# Patient Record
Sex: Male | Born: 1950 | Race: White | Hispanic: No | Marital: Married | Smoking: Former smoker
Health system: Southern US, Community
[De-identification: ages and names within clinical notes are randomized; demographics above are authoritative.]

## PROBLEM LIST (undated history)

## (undated) DIAGNOSIS — Z953 Presence of xenogenic heart valve: Secondary | ICD-10-CM

## (undated) DIAGNOSIS — I351 Nonrheumatic aortic (valve) insufficiency: Secondary | ICD-10-CM

## (undated) DIAGNOSIS — E039 Hypothyroidism, unspecified: Secondary | ICD-10-CM

## (undated) DIAGNOSIS — I25119 Atherosclerotic heart disease of native coronary artery with unspecified angina pectoris: Secondary | ICD-10-CM

## (undated) DIAGNOSIS — Z951 Presence of aortocoronary bypass graft: Secondary | ICD-10-CM

## (undated) DIAGNOSIS — E785 Hyperlipidemia, unspecified: Secondary | ICD-10-CM

## (undated) DIAGNOSIS — J449 Chronic obstructive pulmonary disease, unspecified: Secondary | ICD-10-CM

## (undated) DIAGNOSIS — I9789 Other postprocedural complications and disorders of the circulatory system, not elsewhere classified: Secondary | ICD-10-CM

## (undated) DIAGNOSIS — I4891 Unspecified atrial fibrillation: Secondary | ICD-10-CM

## (undated) DIAGNOSIS — Z720 Tobacco use: Secondary | ICD-10-CM

## (undated) DIAGNOSIS — I5022 Chronic systolic (congestive) heart failure: Secondary | ICD-10-CM

## (undated) DIAGNOSIS — I10 Essential (primary) hypertension: Secondary | ICD-10-CM

## (undated) DIAGNOSIS — I5042 Chronic combined systolic (congestive) and diastolic (congestive) heart failure: Secondary | ICD-10-CM

## (undated) DIAGNOSIS — N183 Chronic kidney disease, stage 3 unspecified: Secondary | ICD-10-CM

## (undated) DIAGNOSIS — I739 Peripheral vascular disease, unspecified: Secondary | ICD-10-CM

## (undated) HISTORY — DX: Hyperlipidemia, unspecified: E78.5

## (undated) HISTORY — DX: Chronic systolic (congestive) heart failure: I50.22

## (undated) HISTORY — DX: Essential (primary) hypertension: I10

## (undated) HISTORY — DX: Chronic combined systolic (congestive) and diastolic (congestive) heart failure: I50.42

## (undated) HISTORY — DX: Unspecified atrial fibrillation: I97.89

## (undated) HISTORY — DX: Unspecified atrial fibrillation: I48.91

## (undated) HISTORY — DX: Peripheral vascular disease, unspecified: I73.9

## (undated) HISTORY — DX: Nonrheumatic aortic (valve) insufficiency: I35.1

---
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
## 2019-05-14 DIAGNOSIS — R0989 Other specified symptoms and signs involving the circulatory and respiratory systems: Secondary | ICD-10-CM

## 2019-05-14 DIAGNOSIS — I351 Nonrheumatic aortic (valve) insufficiency: Secondary | ICD-10-CM

## 2019-05-14 DIAGNOSIS — R079 Chest pain, unspecified: Secondary | ICD-10-CM

## 2019-05-14 DIAGNOSIS — I361 Nonrheumatic tricuspid (valve) insufficiency: Secondary | ICD-10-CM | POA: Diagnosis not present

## 2019-05-14 DIAGNOSIS — R7989 Other specified abnormal findings of blood chemistry: Secondary | ICD-10-CM | POA: Diagnosis not present

## 2019-05-14 DIAGNOSIS — E039 Hypothyroidism, unspecified: Secondary | ICD-10-CM

## 2019-05-14 DIAGNOSIS — J449 Chronic obstructive pulmonary disease, unspecified: Secondary | ICD-10-CM

## 2019-05-14 DIAGNOSIS — I340 Nonrheumatic mitral (valve) insufficiency: Secondary | ICD-10-CM

## 2019-05-14 NOTE — Progress Notes (Deleted)
Cardiology Office Note:    Date:  05/14/2019   ID:  Dennis Gomez, DOB [birth year], MRN [id number]  PCP:  Serita Grammes, MD  Cardiologist:  Shirlee More, MD   Referring MD: No ref. provider found  ASSESSMENT:    No diagnosis found. PLAN:    In order of problems listed above:  1. ***  Next appointment   Medication Adjustments/Labs and Tests Ordered: Current medicines are reviewed at length with the patient today.  Concerns regarding medicines are outlined above.  No orders of the defined types were placed in this encounter.  No orders of the defined types were placed in this encounter.    No chief complaint on file. ***  History of Present Illness:    Dennis Gomez is a 68 y.o. male who is being seen today for the evaluation of heart failure at the request of No ref. provider found.   No past medical history on file.  *** The histories are not reviewed yet. Please review them in the "History" navigator section and refresh this [place].  Current Medications: No outpatient medications have been marked as taking for the 05/15/19 encounter (Appointment) with Richardo Priest, MD.     Allergies:   Patient has no allergy information on record.   Social History   Socioeconomic History  . Marital status: Married    Spouse name: Not on file  . Number of children: Not on file  . Years of education: Not on file  . Highest education level: Not on file  Occupational History  . Not on file  Social Needs  . Financial resource strain: Not on file  . Food insecurity    Worry: Not on file    Inability: Not on file  . Transportation needs    Medical: Not on file    Non-medical: Not on file  Tobacco Use  . Smoking status: Not on file  Substance and Sexual Activity  . Alcohol use: Not on file  . Drug use: Not on file  . Sexual activity: Not on file  Lifestyle  . Physical activity    Days per week: Not on file    Minutes per session: Not on file  . Stress:  Not on file  Relationships  . Social Herbalist on phone: Not on file    Gets together: Not on file    Attends religious service: Not on file    Active member of club or organization: Not on file    Attends meetings of clubs or organizations: Not on file    Relationship status: Not on file  Other Topics Concern  . Not on file  Social History Narrative  . Not on file     Family History: The patient's ***family history is not on file.  ROS:   ROS Please see the history of present illness.    *** All other systems reviewed and are negative.  EKGs/Labs/Other Studies Reviewed:    The following studies were reviewed today: ***  EKG:  EKG is *** ordered today.  The ekg ordered today is personally reviewed and demonstrates ***  Recent Labs: No results found for requested labs within last 8760 hours.  Recent Lipid Panel No results found for: CHOL, TRIG, HDL, CHOLHDL, VLDL, LDLCALC, LDLDIRECT  Physical Exam:    VS:  There were no vitals taken for this visit.    Wt Readings from Last 3 Encounters:  No data found for Wt     GEN: ***  Well nourished, well developed in no acute distress HEENT: Normal NECK: No JVD; No carotid bruits LYMPHATICS: No lymphadenopathy CARDIAC: ***RRR, no murmurs, rubs, gallops RESPIRATORY:  Clear to auscultation without rales, wheezing or rhonchi  ABDOMEN: Soft, non-tender, non-distended MUSCULOSKELETAL:  No edema; No deformity  SKIN: Warm and dry NEUROLOGIC:  Alert and oriented x 3 PSYCHIATRIC:  Normal affect     Signed, Norman Herrlich, MD  05/14/2019 11:04 AM    [facility]

## 2019-05-15 ENCOUNTER — Ambulatory Visit: Payer: Self-pay | Admitting: Cardiology

## 2019-05-15 DIAGNOSIS — J449 Chronic obstructive pulmonary disease, unspecified: Secondary | ICD-10-CM | POA: Diagnosis not present

## 2019-05-15 DIAGNOSIS — R7989 Other specified abnormal findings of blood chemistry: Secondary | ICD-10-CM | POA: Diagnosis not present

## 2019-05-15 DIAGNOSIS — I5040 Unspecified combined systolic (congestive) and diastolic (congestive) heart failure: Secondary | ICD-10-CM

## 2019-05-15 DIAGNOSIS — R0989 Other specified symptoms and signs involving the circulatory and respiratory systems: Secondary | ICD-10-CM | POA: Diagnosis not present

## 2019-05-15 DIAGNOSIS — E039 Hypothyroidism, unspecified: Secondary | ICD-10-CM | POA: Diagnosis not present

## 2019-05-15 DIAGNOSIS — R079 Chest pain, unspecified: Secondary | ICD-10-CM

## 2019-05-16 ENCOUNTER — Inpatient Hospital Stay (HOSPITAL_COMMUNITY)
Admission: AD | Admit: 2019-05-16 | Discharge: 2019-05-27 | DRG: 216 | Disposition: A | Discharge status: Home Health | Payer: Medicare Other | Source: Other Acute Inpatient Hospital | Attending: Thoracic Surgery (Cardiothoracic Vascular Surgery) | Admitting: Thoracic Surgery (Cardiothoracic Vascular Surgery)

## 2019-05-16 ENCOUNTER — Encounter (HOSPITAL_COMMUNITY): Payer: Self-pay | Admitting: Internal Medicine

## 2019-05-16 ENCOUNTER — Other Ambulatory Visit: Payer: Self-pay

## 2019-05-16 DIAGNOSIS — I25119 Atherosclerotic heart disease of native coronary artery with unspecified angina pectoris: Secondary | ICD-10-CM | POA: Diagnosis present

## 2019-05-16 DIAGNOSIS — Q231 Congenital insufficiency of aortic valve: Secondary | ICD-10-CM | POA: Diagnosis not present

## 2019-05-16 DIAGNOSIS — E785 Hyperlipidemia, unspecified: Secondary | ICD-10-CM | POA: Diagnosis not present

## 2019-05-16 DIAGNOSIS — I255 Ischemic cardiomyopathy: Secondary | ICD-10-CM | POA: Diagnosis not present

## 2019-05-16 DIAGNOSIS — I351 Nonrheumatic aortic (valve) insufficiency: Secondary | ICD-10-CM | POA: Diagnosis present

## 2019-05-16 DIAGNOSIS — Z720 Tobacco use: Secondary | ICD-10-CM | POA: Diagnosis present

## 2019-05-16 DIAGNOSIS — I447 Left bundle-branch block, unspecified: Secondary | ICD-10-CM | POA: Diagnosis not present

## 2019-05-16 DIAGNOSIS — J449 Chronic obstructive pulmonary disease, unspecified: Secondary | ICD-10-CM | POA: Diagnosis present

## 2019-05-16 DIAGNOSIS — I340 Nonrheumatic mitral (valve) insufficiency: Secondary | ICD-10-CM | POA: Diagnosis not present

## 2019-05-16 DIAGNOSIS — I440 Atrioventricular block, first degree: Secondary | ICD-10-CM | POA: Diagnosis not present

## 2019-05-16 DIAGNOSIS — I77810 Thoracic aortic ectasia: Secondary | ICD-10-CM | POA: Diagnosis present

## 2019-05-16 DIAGNOSIS — E876 Hypokalemia: Secondary | ICD-10-CM | POA: Diagnosis not present

## 2019-05-16 DIAGNOSIS — Z20828 Contact with and (suspected) exposure to other viral communicable diseases: Secondary | ICD-10-CM | POA: Diagnosis not present

## 2019-05-16 DIAGNOSIS — Z953 Presence of xenogenic heart valve: Secondary | ICD-10-CM

## 2019-05-16 DIAGNOSIS — Z79890 Hormone replacement therapy (postmenopausal): Secondary | ICD-10-CM | POA: Diagnosis not present

## 2019-05-16 DIAGNOSIS — E039 Hypothyroidism, unspecified: Secondary | ICD-10-CM

## 2019-05-16 DIAGNOSIS — J9811 Atelectasis: Secondary | ICD-10-CM

## 2019-05-16 DIAGNOSIS — I480 Paroxysmal atrial fibrillation: Secondary | ICD-10-CM | POA: Diagnosis not present

## 2019-05-16 DIAGNOSIS — R7989 Other specified abnormal findings of blood chemistry: Secondary | ICD-10-CM | POA: Diagnosis not present

## 2019-05-16 DIAGNOSIS — I5043 Acute on chronic combined systolic (congestive) and diastolic (congestive) heart failure: Secondary | ICD-10-CM | POA: Diagnosis not present

## 2019-05-16 DIAGNOSIS — N179 Acute kidney failure, unspecified: Secondary | ICD-10-CM | POA: Diagnosis present

## 2019-05-16 DIAGNOSIS — I130 Hypertensive heart and chronic kidney disease with heart failure and stage 1 through stage 4 chronic kidney disease, or unspecified chronic kidney disease: Secondary | ICD-10-CM | POA: Diagnosis not present

## 2019-05-16 DIAGNOSIS — N183 Chronic kidney disease, stage 3 unspecified: Secondary | ICD-10-CM | POA: Diagnosis present

## 2019-05-16 DIAGNOSIS — D62 Acute posthemorrhagic anemia: Secondary | ICD-10-CM | POA: Diagnosis not present

## 2019-05-16 DIAGNOSIS — I5022 Chronic systolic (congestive) heart failure: Secondary | ICD-10-CM | POA: Diagnosis present

## 2019-05-16 DIAGNOSIS — I429 Cardiomyopathy, unspecified: Secondary | ICD-10-CM

## 2019-05-16 DIAGNOSIS — Z716 Tobacco abuse counseling: Secondary | ICD-10-CM

## 2019-05-16 DIAGNOSIS — Z825 Family history of asthma and other chronic lower respiratory diseases: Secondary | ICD-10-CM | POA: Diagnosis not present

## 2019-05-16 DIAGNOSIS — F17210 Nicotine dependence, cigarettes, uncomplicated: Secondary | ICD-10-CM | POA: Diagnosis not present

## 2019-05-16 DIAGNOSIS — R079 Chest pain, unspecified: Secondary | ICD-10-CM

## 2019-05-16 DIAGNOSIS — I5040 Unspecified combined systolic (congestive) and diastolic (congestive) heart failure: Secondary | ICD-10-CM

## 2019-05-16 DIAGNOSIS — I25110 Atherosclerotic heart disease of native coronary artery with unstable angina pectoris: Secondary | ICD-10-CM | POA: Diagnosis present

## 2019-05-16 DIAGNOSIS — D649 Anemia, unspecified: Secondary | ICD-10-CM | POA: Diagnosis not present

## 2019-05-16 DIAGNOSIS — I509 Heart failure, unspecified: Secondary | ICD-10-CM

## 2019-05-16 DIAGNOSIS — Z951 Presence of aortocoronary bypass graft: Secondary | ICD-10-CM

## 2019-05-16 DIAGNOSIS — D6959 Other secondary thrombocytopenia: Secondary | ICD-10-CM | POA: Diagnosis not present

## 2019-05-16 DIAGNOSIS — I350 Nonrheumatic aortic (valve) stenosis: Secondary | ICD-10-CM

## 2019-05-16 DIAGNOSIS — Z79899 Other long term (current) drug therapy: Secondary | ICD-10-CM

## 2019-05-16 DIAGNOSIS — I10 Essential (primary) hypertension: Secondary | ICD-10-CM | POA: Diagnosis present

## 2019-05-16 DIAGNOSIS — R0989 Other specified symptoms and signs involving the circulatory and respiratory systems: Secondary | ICD-10-CM | POA: Diagnosis not present

## 2019-05-16 DIAGNOSIS — I517 Cardiomegaly: Secondary | ICD-10-CM

## 2019-05-16 HISTORY — DX: Chronic kidney disease, stage 3 unspecified: N18.30

## 2019-05-16 HISTORY — DX: Atherosclerotic heart disease of native coronary artery with unspecified angina pectoris: I25.119

## 2019-05-16 HISTORY — DX: Tobacco use: Z72.0

## 2019-05-16 HISTORY — DX: Hypothyroidism, unspecified: E03.9

## 2019-05-16 HISTORY — DX: Chronic obstructive pulmonary disease, unspecified: J44.9

## 2019-05-16 HISTORY — DX: Chest pain, unspecified: R07.9

## 2019-05-16 HISTORY — DX: Nonrheumatic aortic (valve) insufficiency: I35.1

## 2019-05-16 MED ORDER — ATORVASTATIN CALCIUM 40 MG PO TABS
40.0000 mg | ORAL_TABLET | Freq: Every day | ORAL | Status: DC
  Administered 2019-05-17 – 2019-05-20 (×4): 40 mg via ORAL
  Filled 2019-05-16 (×4): qty 1

## 2019-05-16 MED ORDER — UMECLIDINIUM-VILANTEROL 62.5-25 MCG/INH IN AEPB
1.0000 | INHALATION_SPRAY | Freq: Every day | RESPIRATORY_TRACT | Status: DC
  Administered 2019-05-17 – 2019-05-27 (×9): 1 via RESPIRATORY_TRACT
  Filled 2019-05-16 (×2): qty 14

## 2019-05-16 MED ORDER — FUROSEMIDE 40 MG PO TABS
40.0000 mg | ORAL_TABLET | Freq: Every day | ORAL | Status: DC
  Administered 2019-05-17 – 2019-05-20 (×4): 40 mg via ORAL
  Filled 2019-05-16 (×5): qty 1

## 2019-05-16 MED ORDER — ACETAMINOPHEN 650 MG RE SUPP: 650.0000 mg | Freq: Four times a day (QID) | RECTAL | Status: DC | PRN

## 2019-05-16 MED ORDER — SODIUM CHLORIDE 0.9 % IV SOLN: 250.0000 mL | INTRAVENOUS | Status: DC | PRN

## 2019-05-16 MED ORDER — ACETAMINOPHEN 325 MG PO TABS
650.0000 mg | ORAL_TABLET | Freq: Four times a day (QID) | ORAL | Status: DC | PRN
  Administered 2019-05-18: 650 mg via ORAL
  Filled 2019-05-16: qty 2

## 2019-05-16 MED ORDER — NICOTINE 21 MG/24HR TD PT24: 21.0000 mg | MEDICATED_PATCH | Freq: Every day | TRANSDERMAL | Status: DC | PRN

## 2019-05-16 MED ORDER — SODIUM CHLORIDE 0.9% FLUSH
3.0000 mL | Freq: Two times a day (BID) | INTRAVENOUS | Status: DC
  Administered 2019-05-16 – 2019-05-20 (×4): 3 mL via INTRAVENOUS

## 2019-05-16 MED ORDER — ASPIRIN EC 81 MG PO TBEC
81.0000 mg | DELAYED_RELEASE_TABLET | Freq: Every day | ORAL | Status: DC
  Administered 2019-05-18 – 2019-05-20 (×3): 81 mg via ORAL
  Filled 2019-05-16 (×4): qty 1

## 2019-05-16 MED ORDER — ALBUTEROL SULFATE (2.5 MG/3ML) 0.083% IN NEBU: 2.5000 mg | INHALATION_SOLUTION | Freq: Four times a day (QID) | RESPIRATORY_TRACT | Status: DC | PRN

## 2019-05-16 MED ORDER — LISINOPRIL 10 MG PO TABS
10.0000 mg | ORAL_TABLET | Freq: Every day | ORAL | Status: DC
  Administered 2019-05-17 – 2019-05-20 (×4): 10 mg via ORAL
  Filled 2019-05-16 (×5): qty 1

## 2019-05-16 MED ORDER — ENOXAPARIN SODIUM 40 MG/0.4ML ~~LOC~~ SOLN
40.0000 mg | SUBCUTANEOUS | Status: DC
  Administered 2019-05-16 – 2019-05-19 (×4): 40 mg via SUBCUTANEOUS
  Filled 2019-05-16 (×4): qty 0.4

## 2019-05-16 MED ORDER — LEVOTHYROXINE SODIUM 75 MCG PO TABS
150.0000 ug | ORAL_TABLET | Freq: Every day | ORAL | Status: DC
  Administered 2019-05-17 – 2019-05-21 (×5): 150 ug via ORAL
  Filled 2019-05-16 (×5): qty 2

## 2019-05-16 MED ORDER — PANTOPRAZOLE SODIUM 40 MG PO TBEC
40.0000 mg | DELAYED_RELEASE_TABLET | Freq: Every day | ORAL | Status: DC
  Administered 2019-05-17 – 2019-05-20 (×4): 40 mg via ORAL
  Filled 2019-05-16 (×4): qty 1

## 2019-05-16 MED ORDER — SODIUM CHLORIDE 0.9% FLUSH: 3.0000 mL | INTRAVENOUS | Status: DC | PRN

## 2019-05-16 MED ORDER — HYDRALAZINE HCL 10 MG PO TABS
10.0000 mg | ORAL_TABLET | Freq: Three times a day (TID) | ORAL | Status: DC
  Administered 2019-05-16 – 2019-05-20 (×13): 10 mg via ORAL
  Filled 2019-05-16 (×13): qty 1

## 2019-05-16 NOTE — H&P (Addendum)
TRH H&P    Patient Demographics:    Dennis Gomez, is a 68 y.o. male  MRN: [id number]  DOB - 1951-04-20  Admit Date - 05/16/2019  Referring MD/NP/PA:  ???  Outpatient Primary MD for the patient is [name], MD  Patient coming from: [facility]  Chief complaint-  Mod- severe AR   HPI:    Dennis Gomez  is a 68 y.o. male,   w hypothyroidism, Copd, CHF (EF 35-40%), apparently admitted for chest pain on 05/14/2019 at [facility].  Chest pain w/up  included echocardiogram that revealed new systolic with combined diastolic congestive heart failure with an EF of 35-40% with moderate to severe aortic regurgitation seen.  Cardiology consulted and recommended transfer to [facility] for cardiac catheterization and possible surgical consult for  aortic  regurgitation.  cardiology here Dr. Dulce Sellar spoke with Dr. Excell Seltzer 7/29.  Cardiology will need to be consulted upon arrival.  Note pertinent labs 05/14/2019 Trop 0.24 BNP 17,900  TSH 79   Cardiac echo 05/15/2019 Impression LV severely dilated Moderate concentric left ventricular hypertrophy Overall EF 35-40% Restrictive LV filling pattern consistent with elevated LA pressure Aortic valve bicuspid Moderate to severe aortic regurgitation Mild to moderate mitral regurgitation   Labs 05/16/2019 Na 138, K 4.1, Bun 31, Creatinine 1.30 Glucose 84 Wbc 6.8, hgb 10.6, Plt 194 BNP 17, 500    Review of systems:    In addition to the HPI above,  No Fever-chills, No Headache, No changes with Vision or hearing, No problems swallowing food or Liquids, No Chest pain currently and No Cough or Shortness of Breath, No Abdominal pain, No Nausea or Vomiting, bowel movements are regular, No Blood in stool or Urine, No dysuria, No new skin rashes or bruises, No new joints pains-aches,  No new weakness, tingling, numbness in any extremity, No recent  weight gain or loss, No polyuria, polydypsia or polyphagia, No significant Mental Stressors.  All other systems reviewed and are negative.    Past History of the following :    Past Medical History:  Diagnosis Date  . Aortic regurgitation   . COPD (chronic obstructive pulmonary disease) (HCC)   . Hypothyroid   . Mitral regurgitation       History reviewed. No pertinent surgical history. None   Social History:      Social History   Tobacco Use  . Smoking status: Current Every Day Smoker    Types: Cigarettes  . Smokeless tobacco: Never Used  Substance Use Topics  . Alcohol use: Not Currently       Family History :     Family History  Problem Relation Age of Onset  . Cancer Mother   . COPD Father       Home Medications:   Prior to Admission medications   Not on File     Allergies:    No Known Allergies   Physical Exam:   Vitals  Blood pressure (!) 153/45, pulse 63, temperature 99.3 F (37.4 C), temperature source Oral, resp. rate 18, height 5\' 5"  (1.651  m), weight 59.8 kg, SpO2 93 %.  1.  General: axox3  2. Psychiatric: euthymic  3. Neurologic: cn2-12 intact, reflexes 2+ symmetric, diffuse with no clonus, motor 5/5 in all 4 ext  4. HEENMT:  Anicteric, pupils 1.67mm symmetric, direct, consensual, near intact Neck: no jvd  5. Respiratory : CTAB, prolonged exp phase  6. Cardiovascular : rrr s1, s2, 2/6 sem rusb/ apex  7. Gastrointestinal:  Abd: soft, nt, nd, +bs  8. Skin:  Ext: no c/c/e,  Skin dry over elbows  9.Musculoskeletal:  Good ROM,  No adenopathy   Labs:     Data Review:    CBC No results for input(s): WBC, HGB, HCT, PLT, MCV, MCH, MCHC, RDW, LYMPHSABS, MONOABS, EOSABS, BASOSABS, BANDABS in the last 168 hours.  Invalid input(s): NEUTRABS, BANDSABD ------------------------------------------------------------------------------------------------------------------  No results found for this or any previous visit (from  the past 44 hour(s)).  Chemistries  No results for input(s): NA, K, CL, CO2, GLUCOSE, BUN, CREATININE, CALCIUM, MG, AST, ALT, ALKPHOS, BILITOT in the last 168 hours.  Invalid input(s): GFRCGP ------------------------------------------------------------------------------------------------------------------  ------------------------------------------------------------------------------------------------------------------ GFR: CrCl cannot be calculated (No successful lab value found.). Liver Function Tests: No results for input(s): AST, ALT, ALKPHOS, BILITOT, PROT, ALBUMIN in the last 168 hours. No results for input(s): LIPASE, AMYLASE in the last 168 hours. No results for input(s): AMMONIA in the last 168 hours. Coagulation Profile: No results for input(s): INR, PROTIME in the last 168 hours. Cardiac Enzymes: No results for input(s): CKTOTAL, CKMB, CKMBINDEX, TROPONINI in the last 168 hours. BNP (last 3 results) No results for input(s): PROBNP in the last 8760 hours. HbA1C: No results for input(s): HGBA1C in the last 72 hours. CBG: No results for input(s): GLUCAP in the last 168 hours. Lipid Profile: No results for input(s): CHOL, HDL, LDLCALC, TRIG, CHOLHDL, LDLDIRECT in the last 72 hours. Thyroid Function Tests: No results for input(s): TSH, T4TOTAL, FREET4, T3FREE, THYROIDAB in the last 72 hours. Anemia Panel: No results for input(s): VITAMINB12, FOLATE, FERRITIN, TIBC, IRON, RETICCTPCT in the last 72 hours.  --------------------------------------------------------------------------------------------------------------- Urine analysis: No results found for: COLORURINE, APPEARANCEUR, LABSPEC, PHURINE, GLUCOSEU, HGBUR, BILIRUBINUR, KETONESUR, PROTEINUR, UROBILINOGEN, NITRITE, LEUKOCYTESUR    Imaging Results:    No results found.     Assessment & Plan:    Principal Problem:   Aortic regurgitation Active Problems:   Hypothyroidism   Mitral valve regurgitation   COPD  (chronic obstructive pulmonary disease) (HCC)   Chest pain Not currently having Pt states occurs when he eats chicken, hot dogs, or ham burgers  PPI Consider GI consultation in AM  Aortic regurgitation Mitral regurgitation Cardiology consulted, appreciate input  Cardiomyopathy Unclear if ischemic or nonischemic Cardiology consulted , appreciate input Pt was on lasix 40mg  iv qday, will convert to 40mg  po qday Cont lisinopril 10mg  po qday Cont hydralazine 10mg  po tid since both were initiated at [facility], may be able to DC hydralazine and titrate up lisinopril, defer to cardiology Per discharge summary not on B blocker due to Copd ?  Hypothyroidism Cont Levothyroxine at 150 micrograms po qday (was increased at [place] per discharge summary, however on 05/15/2019, active medication states was on levothyroxine 100 micrograms po qday, may need to call [facility] to figure out correct dose)  Hyperlipidemia (LDL =174) Cont Lipitor 40mg  po qhs  Copd DC pulmicort, duoneb prn Start Anoro 1puff qday Albuterol neb q6h prn   Tobacco abuse Pt counselled on smoking cessation x 13minutes Nicotine patch prn    DVT Prophylaxis-   Lovenox -  SCDs   AM Labs Ordered, also please review Full Orders  Family Communication: Admission, patients condition and plan of care including tests being ordered have been discussed with the patient who indicate understanding and agree with the plan and Code Status.  Code Status:  FULL CODE<  ,  Notified Corrie DandyMary his spouse [facility]   Admission status: Observation: Based on patients clinical presentation and evaluation of above clinical data, I have made determination that patient meets output criteria at this time.  Time spent in minutes : 70   Pearson GrippeJames Lashayla Armes M.D on 05/16/2019 at 9:46 PM

## 2019-05-17 ENCOUNTER — Encounter (HOSPITAL_COMMUNITY)
Admission: AD | Disposition: A | Discharge status: Home Health | Payer: Self-pay | Source: Other Acute Inpatient Hospital | Attending: Thoracic Surgery (Cardiothoracic Vascular Surgery)

## 2019-05-17 ENCOUNTER — Ambulatory Visit (HOSPITAL_COMMUNITY): Payer: Medicare Other

## 2019-05-17 DIAGNOSIS — I25119 Atherosclerotic heart disease of native coronary artery with unspecified angina pectoris: Secondary | ICD-10-CM | POA: Diagnosis not present

## 2019-05-17 DIAGNOSIS — D649 Anemia, unspecified: Secondary | ICD-10-CM | POA: Diagnosis present

## 2019-05-17 DIAGNOSIS — I340 Nonrheumatic mitral (valve) insufficiency: Secondary | ICD-10-CM | POA: Diagnosis present

## 2019-05-17 DIAGNOSIS — E785 Hyperlipidemia, unspecified: Secondary | ICD-10-CM | POA: Diagnosis present

## 2019-05-17 DIAGNOSIS — I350 Nonrheumatic aortic (valve) stenosis: Secondary | ICD-10-CM | POA: Diagnosis not present

## 2019-05-17 DIAGNOSIS — I200 Unstable angina: Secondary | ICD-10-CM | POA: Diagnosis not present

## 2019-05-17 DIAGNOSIS — Z716 Tobacco abuse counseling: Secondary | ICD-10-CM | POA: Diagnosis not present

## 2019-05-17 DIAGNOSIS — N179 Acute kidney failure, unspecified: Secondary | ICD-10-CM | POA: Diagnosis not present

## 2019-05-17 DIAGNOSIS — Z79890 Hormone replacement therapy (postmenopausal): Secondary | ICD-10-CM | POA: Diagnosis not present

## 2019-05-17 DIAGNOSIS — Z825 Family history of asthma and other chronic lower respiratory diseases: Secondary | ICD-10-CM | POA: Diagnosis not present

## 2019-05-17 DIAGNOSIS — I2510 Atherosclerotic heart disease of native coronary artery without angina pectoris: Secondary | ICD-10-CM | POA: Diagnosis not present

## 2019-05-17 DIAGNOSIS — I5043 Acute on chronic combined systolic (congestive) and diastolic (congestive) heart failure: Secondary | ICD-10-CM | POA: Diagnosis present

## 2019-05-17 DIAGNOSIS — N183 Chronic kidney disease, stage 3 (moderate): Secondary | ICD-10-CM | POA: Diagnosis present

## 2019-05-17 DIAGNOSIS — D6959 Other secondary thrombocytopenia: Secondary | ICD-10-CM | POA: Diagnosis not present

## 2019-05-17 DIAGNOSIS — I501 Left ventricular failure: Secondary | ICD-10-CM

## 2019-05-17 DIAGNOSIS — I77810 Thoracic aortic ectasia: Secondary | ICD-10-CM | POA: Diagnosis present

## 2019-05-17 DIAGNOSIS — I5022 Chronic systolic (congestive) heart failure: Secondary | ICD-10-CM | POA: Diagnosis not present

## 2019-05-17 DIAGNOSIS — Q231 Congenital insufficiency of aortic valve: Secondary | ICD-10-CM | POA: Diagnosis not present

## 2019-05-17 DIAGNOSIS — I447 Left bundle-branch block, unspecified: Secondary | ICD-10-CM | POA: Diagnosis not present

## 2019-05-17 DIAGNOSIS — I440 Atrioventricular block, first degree: Secondary | ICD-10-CM | POA: Diagnosis not present

## 2019-05-17 DIAGNOSIS — I25110 Atherosclerotic heart disease of native coronary artery with unstable angina pectoris: Secondary | ICD-10-CM | POA: Diagnosis present

## 2019-05-17 DIAGNOSIS — I130 Hypertensive heart and chronic kidney disease with heart failure and stage 1 through stage 4 chronic kidney disease, or unspecified chronic kidney disease: Secondary | ICD-10-CM | POA: Diagnosis present

## 2019-05-17 DIAGNOSIS — I255 Ischemic cardiomyopathy: Secondary | ICD-10-CM | POA: Diagnosis present

## 2019-05-17 DIAGNOSIS — I351 Nonrheumatic aortic (valve) insufficiency: Secondary | ICD-10-CM

## 2019-05-17 DIAGNOSIS — Z20828 Contact with and (suspected) exposure to other viral communicable diseases: Secondary | ICD-10-CM | POA: Diagnosis present

## 2019-05-17 DIAGNOSIS — E039 Hypothyroidism, unspecified: Secondary | ICD-10-CM | POA: Diagnosis present

## 2019-05-17 DIAGNOSIS — I10 Essential (primary) hypertension: Secondary | ICD-10-CM | POA: Diagnosis not present

## 2019-05-17 DIAGNOSIS — D62 Acute posthemorrhagic anemia: Secondary | ICD-10-CM | POA: Diagnosis not present

## 2019-05-17 DIAGNOSIS — F17210 Nicotine dependence, cigarettes, uncomplicated: Secondary | ICD-10-CM | POA: Diagnosis present

## 2019-05-17 DIAGNOSIS — Z01810 Encounter for preprocedural cardiovascular examination: Secondary | ICD-10-CM | POA: Diagnosis not present

## 2019-05-17 DIAGNOSIS — I25708 Atherosclerosis of coronary artery bypass graft(s), unspecified, with other forms of angina pectoris: Secondary | ICD-10-CM | POA: Diagnosis not present

## 2019-05-17 DIAGNOSIS — R079 Chest pain, unspecified: Secondary | ICD-10-CM | POA: Diagnosis not present

## 2019-05-17 DIAGNOSIS — I25118 Atherosclerotic heart disease of native coronary artery with other forms of angina pectoris: Secondary | ICD-10-CM | POA: Diagnosis not present

## 2019-05-17 DIAGNOSIS — I480 Paroxysmal atrial fibrillation: Secondary | ICD-10-CM | POA: Diagnosis not present

## 2019-05-17 DIAGNOSIS — E876 Hypokalemia: Secondary | ICD-10-CM | POA: Diagnosis not present

## 2019-05-17 DIAGNOSIS — J449 Chronic obstructive pulmonary disease, unspecified: Secondary | ICD-10-CM | POA: Diagnosis present

## 2019-05-17 HISTORY — DX: Acute kidney failure, unspecified: N17.9

## 2019-05-17 HISTORY — PX: RIGHT/LEFT HEART CATH AND CORONARY ANGIOGRAPHY: CATH118266

## 2019-05-17 LAB — POCT I-STAT 7, (LYTES, BLD GAS, ICA,H+H)
Acid-Base Excess: 2 mmol/L (ref 0.0–2.0)
Bicarbonate: 27.1 mmol/L (ref 20.0–28.0)
Calcium, Ion: 1.28 mmol/L (ref 1.15–1.40)
HCT: 32 % — ABNORMAL LOW (ref 39.0–52.0)
Hemoglobin: 10.9 g/dL — ABNORMAL LOW (ref 13.0–17.0)
O2 Saturation: 97 %
Potassium: 3.6 mmol/L (ref 3.5–5.1)
Sodium: 141 mmol/L (ref 135–145)
TCO2: 28 mmol/L (ref 22–32)
pCO2 arterial: 40.9 mmHg (ref 32.0–48.0)
pH, Arterial: 7.429 (ref 7.350–7.450)
pO2, Arterial: 89 mmHg (ref 83.0–108.0)

## 2019-05-17 LAB — IRON AND TIBC
Iron: 46 ug/dL (ref 45–182)
Saturation Ratios: 15 % — ABNORMAL LOW (ref 17.9–39.5)
TIBC: 311 ug/dL (ref 250–450)
UIBC: 265 ug/dL

## 2019-05-17 LAB — COMPREHENSIVE METABOLIC PANEL
ALT: 14 U/L (ref 0–44)
AST: 27 U/L (ref 15–41)
Albumin: 4.2 g/dL (ref 3.5–5.0)
Alkaline Phosphatase: 65 U/L (ref 38–126)
Anion gap: 12 (ref 5–15)
BUN: 36 mg/dL — ABNORMAL HIGH (ref 8–23)
CO2: 27 mmol/L (ref 22–32)
Calcium: 10.1 mg/dL (ref 8.9–10.3)
Chloride: 100 mmol/L (ref 98–111)
Creatinine, Ser: 1.46 mg/dL — ABNORMAL HIGH (ref 0.61–1.24)
GFR calc Af Amer: 58 mL/min — ABNORMAL LOW (ref >60)
GFR calc non Af Amer: 50 mL/min — ABNORMAL LOW (ref >60)
Glucose, Bld: 83 mg/dL (ref 70–99)
Potassium: 3.7 mmol/L (ref 3.5–5.1)
Sodium: 139 mmol/L (ref 135–145)
Total Bilirubin: 0.9 mg/dL (ref 0.3–1.2)
Total Protein: 8 g/dL (ref 6.5–8.1)

## 2019-05-17 LAB — HIV ANTIBODY (ROUTINE TESTING W REFLEX): HIV Screen 4th Generation wRfx: NONREACTIVE

## 2019-05-17 LAB — POCT I-STAT EG7
Acid-Base Excess: 3 mmol/L — ABNORMAL HIGH (ref 0.0–2.0)
Bicarbonate: 28.2 mmol/L — ABNORMAL HIGH (ref 20.0–28.0)
Calcium, Ion: 1.22 mmol/L (ref 1.15–1.40)
HCT: 31 % — ABNORMAL LOW (ref 39.0–52.0)
Hemoglobin: 10.5 g/dL — ABNORMAL LOW (ref 13.0–17.0)
O2 Saturation: 61 %
Potassium: 3.5 mmol/L (ref 3.5–5.1)
Sodium: 143 mmol/L (ref 135–145)
TCO2: 30 mmol/L (ref 22–32)
pCO2, Ven: 43.4 mmHg — ABNORMAL LOW (ref 44.0–60.0)
pH, Ven: 7.421 (ref 7.250–7.430)
pO2, Ven: 31 mmHg — CL (ref 32.0–45.0)

## 2019-05-17 LAB — PROTIME-INR
INR: 1.3 — ABNORMAL HIGH (ref 0.8–1.2)
Prothrombin Time: 15.9 seconds — ABNORMAL HIGH (ref 11.4–15.2)

## 2019-05-17 LAB — CBC
HCT: 38.2 % — ABNORMAL LOW (ref 39.0–52.0)
Hemoglobin: 12.6 g/dL — ABNORMAL LOW (ref 13.0–17.0)
MCH: 31 pg (ref 26.0–34.0)
MCHC: 33 g/dL (ref 30.0–36.0)
MCV: 94.1 fL (ref 80.0–100.0)
Platelets: 231 10*3/uL (ref 150–400)
RBC: 4.06 MIL/uL — ABNORMAL LOW (ref 4.22–5.81)
RDW: 16.2 % — ABNORMAL HIGH (ref 11.5–15.5)
WBC: 6.7 10*3/uL (ref 4.0–10.5)
nRBC: 0 % (ref 0.0–0.2)

## 2019-05-17 LAB — APTT: aPTT: 44 seconds — ABNORMAL HIGH (ref 24–36)

## 2019-05-17 LAB — ECHOCARDIOGRAM COMPLETE
Height: 65 in
Weight: 2100.8 oz

## 2019-05-17 LAB — VITAMIN B12: Vitamin B-12: 463 pg/mL (ref 180–914)

## 2019-05-17 LAB — SEDIMENTATION RATE: Sed Rate: 114 mm/hr — ABNORMAL HIGH (ref 0–16)

## 2019-05-17 LAB — SARS CORONAVIRUS 2 BY RT PCR (HOSPITAL ORDER, PERFORMED IN ~~LOC~~ HOSPITAL LAB): SARS Coronavirus 2: NEGATIVE

## 2019-05-17 LAB — FERRITIN: Ferritin: 195 ng/mL (ref 24–336)

## 2019-05-17 SURGERY — RIGHT/LEFT HEART CATH AND CORONARY ANGIOGRAPHY
Anesthesia: LOCAL

## 2019-05-17 MED ORDER — SODIUM CHLORIDE 0.9 % IV SOLN
INTRAVENOUS | Status: DC
  Administered 2019-05-17: 12:00:00 via INTRAVENOUS

## 2019-05-17 MED ORDER — SODIUM CHLORIDE 0.9% FLUSH
3.0000 mL | Freq: Two times a day (BID) | INTRAVENOUS | Status: DC
  Administered 2019-05-17 – 2019-05-20 (×2): 3 mL via INTRAVENOUS

## 2019-05-17 MED ORDER — IOHEXOL 350 MG/ML SOLN
INTRAVENOUS | Status: DC | PRN
  Administered 2019-05-17: 80 mL via INTRA_ARTERIAL

## 2019-05-17 MED ORDER — FENTANYL CITRATE (PF) 100 MCG/2ML IJ SOLN
INTRAMUSCULAR | Status: DC | PRN
  Administered 2019-05-17: 25 ug via INTRAVENOUS

## 2019-05-17 MED ORDER — FENTANYL CITRATE (PF) 100 MCG/2ML IJ SOLN
INTRAMUSCULAR | Status: AC
  Filled 2019-05-17: qty 2

## 2019-05-17 MED ORDER — LIDOCAINE HCL (PF) 1 % IJ SOLN
INTRAMUSCULAR | Status: DC | PRN
  Administered 2019-05-17 (×2): 2 mL

## 2019-05-17 MED ORDER — HEPARIN SODIUM (PORCINE) 1000 UNIT/ML IJ SOLN
INTRAMUSCULAR | Status: AC
  Filled 2019-05-17: qty 1

## 2019-05-17 MED ORDER — VERAPAMIL HCL 2.5 MG/ML IV SOLN
INTRAVENOUS | Status: DC | PRN
  Administered 2019-05-17: 15:00:00 10 mL via INTRA_ARTERIAL

## 2019-05-17 MED ORDER — SODIUM CHLORIDE 0.9% FLUSH
3.0000 mL | Freq: Two times a day (BID) | INTRAVENOUS | Status: DC
  Administered 2019-05-18 – 2019-05-20 (×5): 3 mL via INTRAVENOUS

## 2019-05-17 MED ORDER — ONDANSETRON HCL 4 MG/2ML IJ SOLN: 4.0000 mg | Freq: Four times a day (QID) | INTRAMUSCULAR | Status: DC | PRN

## 2019-05-17 MED ORDER — MIDAZOLAM HCL 2 MG/2ML IJ SOLN
INTRAMUSCULAR | Status: AC
  Filled 2019-05-17: qty 2

## 2019-05-17 MED ORDER — MIDAZOLAM HCL 2 MG/2ML IJ SOLN
INTRAMUSCULAR | Status: DC | PRN
  Administered 2019-05-17: 1 mg via INTRAVENOUS

## 2019-05-17 MED ORDER — SODIUM CHLORIDE 0.9 % WEIGHT BASED INFUSION
1.0000 mL/kg/h | INTRAVENOUS | Status: AC
  Administered 2019-05-17 – 2019-05-18 (×2): 1 mL/kg/h via INTRAVENOUS

## 2019-05-17 MED ORDER — SODIUM CHLORIDE 0.9 % IV SOLN
INTRAVENOUS | Status: AC | PRN
  Administered 2019-05-17: 250 mL via INTRAVENOUS

## 2019-05-17 MED ORDER — HEPARIN (PORCINE) IN NACL 1000-0.9 UT/500ML-% IV SOLN
INTRAVENOUS | Status: DC | PRN
  Administered 2019-05-17 (×2): 500 mL

## 2019-05-17 MED ORDER — SODIUM CHLORIDE 0.9% FLUSH: 3.0000 mL | INTRAVENOUS | Status: DC | PRN

## 2019-05-17 MED ORDER — SODIUM CHLORIDE 0.9 % IV SOLN: 250.0000 mL | INTRAVENOUS | Status: DC | PRN

## 2019-05-17 MED ORDER — HEPARIN (PORCINE) IN NACL 1000-0.9 UT/500ML-% IV SOLN
INTRAVENOUS | Status: AC
  Filled 2019-05-17: qty 1000

## 2019-05-17 MED ORDER — VERAPAMIL HCL 2.5 MG/ML IV SOLN
INTRAVENOUS | Status: AC
  Filled 2019-05-17: qty 2

## 2019-05-17 MED ORDER — HEPARIN SODIUM (PORCINE) 1000 UNIT/ML IJ SOLN
INTRAMUSCULAR | Status: DC | PRN
  Administered 2019-05-17: 3000 [IU] via INTRAVENOUS

## 2019-05-17 MED ORDER — LABETALOL HCL 5 MG/ML IV SOLN: 10.0000 mg | INTRAVENOUS | Status: AC | PRN

## 2019-05-17 MED ORDER — LIDOCAINE HCL (PF) 1 % IJ SOLN
INTRAMUSCULAR | Status: AC
  Filled 2019-05-17: qty 30

## 2019-05-17 MED ORDER — ASPIRIN 81 MG PO CHEW
81.0000 mg | CHEWABLE_TABLET | ORAL | Status: AC
  Administered 2019-05-17: 81 mg via ORAL
  Filled 2019-05-17: qty 1

## 2019-05-17 MED ORDER — HYDRALAZINE HCL 20 MG/ML IJ SOLN: 10.0000 mg | INTRAMUSCULAR | Status: AC | PRN

## 2019-05-17 SURGICAL SUPPLY — 14 items
CATH BALLN WEDGE 5F 110CM (CATHETERS) ×2 IMPLANT
CATH IMPULSE 5F ANG/FL3.5 (CATHETERS) ×2 IMPLANT
COVER DOME SNAP 22 D (MISCELLANEOUS) ×2 IMPLANT
DEVICE RAD COMP TR BAND LRG (VASCULAR PRODUCTS) ×2 IMPLANT
GLIDESHEATH SLEND SS 6F .021 (SHEATH) ×2 IMPLANT
GUIDEWIRE .025 260CM (WIRE) ×2 IMPLANT
GUIDEWIRE INQWIRE 1.5J.035X260 (WIRE) ×1 IMPLANT
INQWIRE 1.5J .035X260CM (WIRE) ×2
KIT HEART LEFT (KITS) ×2 IMPLANT
PACK CARDIAC CATHETERIZATION (CUSTOM PROCEDURE TRAY) ×2 IMPLANT
SHEATH RAIN 4/5FR (SHEATH) ×2 IMPLANT
SYR MEDRAD MARK 7 150ML (SYRINGE) ×2 IMPLANT
TRANSDUCER W/STOPCOCK (MISCELLANEOUS) ×2 IMPLANT
TUBING CIL FLEX 10 FLL-RA (TUBING) ×2 IMPLANT

## 2019-05-17 NOTE — H&P (View-Only) (Signed)
 Progress Note  Patient Name: Dennis Gomez Date of Encounter: 05/17/2019  Primary Cardiologist: No primary care provider on file. new BMunley   Subjective   Currently no chest pain or SOB, head down to 20-30 degrees.    Inpatient Medications    Scheduled Meds: . aspirin EC  81 mg Oral Daily  . atorvastatin  40 mg Oral q1800  . enoxaparin (LOVENOX) injection  40 mg Subcutaneous Q24H  . furosemide  40 mg Oral Daily  . hydrALAZINE  10 mg Oral TID  . levothyroxine  150 mcg Oral Q0600  . lisinopril  10 mg Oral Daily  . pantoprazole  40 mg Oral Daily  . sodium chloride flush  3 mL Intravenous Q12H  . umeclidinium-vilanterol  1 puff Inhalation Daily   Continuous Infusions: . sodium chloride     PRN Meds: sodium chloride, acetaminophen **OR** acetaminophen, albuterol, nicotine, sodium chloride flush   Vital Signs    Vitals:   05/17/19 0112 05/17/19 0142 05/17/19 0213 05/17/19 0500  BP: (!) 94/40 (!) 99/37 (!) 92/48 (!) 115/41  Pulse:    (!) 55  Resp:    18  Temp:    97.8 F (36.6 C)  TempSrc:    Oral  SpO2:    95%  Weight:    59.6 kg  Height:        Intake/Output Summary (Last 24 hours) at 05/17/2019 0719 Last data filed at 05/16/2019 2200 Gross per 24 hour  Intake 220 ml  Output 300 ml  Net -80 ml   Last 3 Weights 05/17/2019 05/16/2019  Weight (lbs) 131 lb 4.8 oz 131 lb 14.4 oz  Weight (kg) 59.557 kg 59.829 kg      Telemetry    SB to 50 - Personally Reviewed  ECG    No new - Personally Reviewed  Physical Exam   GEN: No acute distress.   Neck: No JVD Cardiac: RRR, 3/6 systolic murmur, no rubs, or gallops.  Respiratory: Clear to auscultation bilaterally. GI: Soft, nontender, non-distended  MS: No edema; No deformity. Neuro:  Nonfocal  Psych: Normal affect   Labs    High Sensitivity Troponin:  No results for input(s): TROPONINIHS in the last 720 hours.    Cardiac EnzymesNo results for input(s): TROPONINI in the last 168 hours. No results for  input(s): TROPIPOC in the last 168 hours.   Chemistry Recent Labs  Lab 05/17/19 0436  NA 139  K 3.7  CL 100  CO2 27  GLUCOSE 83  BUN 36*  CREATININE 1.46*  CALCIUM 10.1  PROT 8.0  ALBUMIN 4.2  AST 27  ALT 14  ALKPHOS 65  BILITOT 0.9  GFRNONAA 50*  GFRAA 58*  ANIONGAP 12     Hematology Recent Labs  Lab 05/17/19 0436  WBC 6.7  RBC 4.06*  HGB 12.6*  HCT 38.2*  MCV 94.1  MCH 31.0  MCHC 33.0  RDW 16.2*  PLT 231    BNPNo results for input(s): BNP, PROBNP in the last 168 hours.   DDimer No results for input(s): DDIMER in the last 168 hours.   Radiology    No results found.  Cardiac Studies  Echo 05/15/19 His echocardiogram on 05/15/2019 (at [facility]) revealed a severely dilated left ventricle with moderate concentric LVH.  The ejection fraction was 35 to 40%.  There was evidence of moderate to severe aortic regurgitation  Patient Profile     67 y.o. male with a past medical history significant for COPD, tobacco abuse   and hypothyroidism who presented to [facility] on 05/14/2019 for complaints of off-and-on chest pain.  Echo with EF 35-40% and moderate to severe AR.  Troponin 0.24  BNP 17900   Assessment & Plan    1.  Cardiomyopathy new with EF 35-40%  CHF. Will need R&L cardiac cath  On lisinopril and hydralazine, holding BB for now due to COPD and given their controversial role in severe acute/subacute aortic regurgitation (due to their effect on prolonging diastole).  Sed rate 114  2.  moderate to severe AR on echo ? Repeat echo defer to Dr. Clifton James  3.  Chest pain some after meals.  Troponin pk 0.28  4.  Tobacco use on nicotine patch  5.  COPD per primary team.  6.  HLD on statin LDL 174, HDL 25  T chol 216  7.  Hypothyroidism on levothyroxine per PCP  TSH 79  8.  Sinus brady not on BB  9.   Acute systolic HF stable, can lie flat without SOb  Pt understands some of the issues and feels if he needs surgery to do it.        For questions or updates, please contact [place] Please consult [email or web address] for contact info under        Signed, Cecilie Kicks, NP  05/17/2019, 7:19 AM    I have personally seen and examined this patient. I agree with the assessment and plan as outlined above.  He has newly diagnosed aortic insufficiency with dilated LV cavity. His echo was at [facility]. Will repeat echo here today. Will arrange right and left heart cath today. I would anticipate that he will likely need a gated cardiac CT this weekend to assess his aortic root and valve vs TEE.   Lauree Chandler 05/17/2019 9:53 AM

## 2019-05-17 NOTE — Progress Notes (Addendum)
Progress Note  Patient Name: Lenoria ChimeMelvin Goonan Date of Encounter: 05/17/2019  Primary Cardiologist: No primary care provider on file. new BMunley   Subjective   Currently no chest pain or SOB, head down to 20-30 degrees.    Inpatient Medications    Scheduled Meds: . aspirin EC  81 mg Oral Daily  . atorvastatin  40 mg Oral q1800  . enoxaparin (LOVENOX) injection  40 mg Subcutaneous Q24H  . furosemide  40 mg Oral Daily  . hydrALAZINE  10 mg Oral TID  . levothyroxine  150 mcg Oral Q0600  . lisinopril  10 mg Oral Daily  . pantoprazole  40 mg Oral Daily  . sodium chloride flush  3 mL Intravenous Q12H  . umeclidinium-vilanterol  1 puff Inhalation Daily   Continuous Infusions: . sodium chloride     PRN Meds: sodium chloride, acetaminophen **OR** acetaminophen, albuterol, nicotine, sodium chloride flush   Vital Signs    Vitals:   05/17/19 0112 05/17/19 0142 05/17/19 0213 05/17/19 0500  BP: (!) 94/40 (!) 99/37 (!) 92/48 (!) 115/41  Pulse:    (!) 55  Resp:    18  Temp:    97.8 F (36.6 C)  TempSrc:    Oral  SpO2:    95%  Weight:    59.6 kg  Height:        Intake/Output Summary (Last 24 hours) at 05/17/2019 0719 Last data filed at 05/16/2019 2200 Gross per 24 hour  Intake 220 ml  Output 300 ml  Net -80 ml   Last 3 Weights 05/17/2019 05/16/2019  Weight (lbs) 131 lb 4.8 oz 131 lb 14.4 oz  Weight (kg) 59.557 kg 59.829 kg      Telemetry    SB to 50 - Personally Reviewed  ECG    No new - Personally Reviewed  Physical Exam   GEN: No acute distress.   Neck: No JVD Cardiac: RRR, 3/6 systolic murmur, no rubs, or gallops.  Respiratory: Clear to auscultation bilaterally. GI: Soft, nontender, non-distended  MS: No edema; No deformity. Neuro:  Nonfocal  Psych: Normal affect   Labs    High Sensitivity Troponin:  No results for input(s): TROPONINIHS in the last 720 hours.    Cardiac EnzymesNo results for input(s): TROPONINI in the last 168 hours. No results for  input(s): TROPIPOC in the last 168 hours.   Chemistry Recent Labs  Lab 05/17/19 0436  NA 139  K 3.7  CL 100  CO2 27  GLUCOSE 83  BUN 36*  CREATININE 1.46*  CALCIUM 10.1  PROT 8.0  ALBUMIN 4.2  AST 27  ALT 14  ALKPHOS 65  BILITOT 0.9  GFRNONAA 50*  GFRAA 58*  ANIONGAP 12     Hematology Recent Labs  Lab 05/17/19 0436  WBC 6.7  RBC 4.06*  HGB 12.6*  HCT 38.2*  MCV 94.1  MCH 31.0  MCHC 33.0  RDW 16.2*  PLT 231    BNPNo results for input(s): BNP, PROBNP in the last 168 hours.   DDimer No results for input(s): DDIMER in the last 168 hours.   Radiology    No results found.  Cardiac Studies  Echo 05/15/19 [facility] revealed a severely dilated left ventricle with moderate concentric LVH.  The ejection fraction was 35 to 40%.  There was evidence of moderate to severe aortic regurgitation  Patient Profile     68 y.o. male with a past medical history significant for COPD, tobacco abuse  and hypothyroidism who presented to [facility] on 05/14/2019 for complaints of off-and-on chest pain.  Echo with EF 35-40% and moderate to severe AR.  Troponin 0.24  BNP 17900   Assessment & Plan    1.  Cardiomyopathy new with EF 35-40%  CHF. Will need R&L cardiac cath  On lisinopril and hydralazine, holding BB for now due to COPD and given their controversial role in severe acute/subacute aortic regurgitation (due to their effect on prolonging diastole).  Sed rate 114  2.  moderate to severe AR on echo ? Repeat echo defer to Dr. Clifton James  3.  Chest pain some after meals.  Troponin pk 0.28  4.  Tobacco use on nicotine patch  5.  COPD per primary team.  6.  HLD on statin LDL 174, HDL 25  T chol 216  7.  Hypothyroidism on levothyroxine per PCP  TSH 79  8.  Sinus brady not on BB  9.   Acute systolic HF stable, can lie flat without SOb  Pt understands some of the issues and feels if he needs surgery to do it.        For questions or updates, please contact [place] Please consult [email or web address] for contact info under        Signed, Cecilie Kicks, NP  05/17/2019, 7:19 AM    I have personally seen and examined this patient. I agree with the assessment and plan as outlined above.  He has newly diagnosed aortic insufficiency with dilated LV cavity. His echo was at [facility]. Will repeat echo here today. Will arrange right and left heart cath today. I would anticipate that he will likely need a gated cardiac CT this weekend to assess his aortic root and valve vs TEE.   Lauree Chandler 05/17/2019 9:53 AM

## 2019-05-17 NOTE — Progress Notes (Signed)
  Echocardiogram 2D Echocardiogram has been performed.  Nuvia Hileman L Androw 05/17/2019, 11:24 AM

## 2019-05-17 NOTE — Progress Notes (Signed)
[facility] PROGRESS NOTE  Kalem Rockwell WPY:[id number] DOB: 1951-02-11 DOA: 05/16/2019 PCP: Serita Grammes, MD  Assessment/Plan:  Chest pain no further chest pain. He reports pain intermittent and usually when he "bends over". Trop 0.28. ekg without acute changes -PPI -supportive care -monitor on tele  Aortic regurgitation/Mitral regurgitation evaluated by cards who recommed repeating echo, right and left heart cath today. Note indicates likely need gated cardiac CT to assess aortic root and valve vs TEE -see card note.  Cardiomyopathy  -Repeat echo as noted above. -Cont lisinopril 10mg  po qday -Cont hydralazine 10mg  po tid,  -Per discharge summary not on B blocker due to Copd ?  Acute kidney injury. Creatinine 1.4. baseline unknown -gentle IV fluids -hold nephrotoxins -monitor urine output -recheck in am  Hypothyroidism Cont Levothyroxine at 150 micrograms po qday   Hyperlipidemia (LDL =174) Cont Lipitor 40mg  po qhs  Copd stable at baseline. Not on home oxygen -duoneb prn -Start Anoro 1puff qday -Albuterol neb q6h prn   Tobacco abuse Pt counselled on smoking cessation x 85minutes Nicotine patch prn  Code Status: full Family Communication: patient Disposition Plan: to be determine   Consultants:  [email or web address]  Procedures:    Antibiotics:    HPI/Subjective: Awake alert no acute distress  Objective: Vitals:   05/17/19 0900 05/17/19 0901  BP: (!) 123/52   Pulse: 61   Resp:    Temp:    SpO2: 94% 98%    Intake/Output Summary (Last 24 hours) at 05/17/2019 1256 Last data filed at 05/17/2019 1023 Gross per 24 hour  Intake 220 ml  Output 525 ml  Net -305 ml   Filed Weights   05/16/19 2036 05/17/19 0500  Weight: 59.8 kg 59.6 kg    Exam:   General:  Awake alert no acute distress  Cardiovascular: rrr +murmur no LE edema  Respiratory: normal effort BS coarse good air movement  Abdomen: non-distended non-tender +BS no guarding or  rebounding  Musculoskeletal: joints without swelling/erythema   Data Reviewed: Basic Metabolic Panel: Recent Labs  Lab 05/17/19 0436  NA 139  K 3.7  CL 100  CO2 27  GLUCOSE 83  BUN 36*  CREATININE 1.46*  CALCIUM 10.1   Liver Function Tests: Recent Labs  Lab 05/17/19 0436  AST 27  ALT 14  ALKPHOS 65  BILITOT 0.9  PROT 8.0  ALBUMIN 4.2   No results for input(s): LIPASE, AMYLASE in the last 168 hours. No results for input(s): AMMONIA in the last 168 hours. CBC: Recent Labs  Lab 05/17/19 0436  WBC 6.7  HGB 12.6*  HCT 38.2*  MCV 94.1  PLT 231   Cardiac Enzymes: No results for input(s): CKTOTAL, CKMB, CKMBINDEX, TROPONINI in the last 168 hours. BNP (last 3 results) No results for input(s): BNP in the last 8760 hours.  ProBNP (last 3 results) No results for input(s): PROBNP in the last 8760 hours.  CBG: No results for input(s): GLUCAP in the last 168 hours.  Recent Results (from the past 240 hour(s))  SARS Coronavirus 2 (CEPHEID - Performed in [facility] hospital lab), Hosp Order     Status: None   Collection Time: 05/16/19 11:41 PM   Specimen: Nasopharyngeal Swab  Result Value Ref Range Status   SARS Coronavirus 2 NEGATIVE NEGATIVE Final    Comment: (NOTE) If result is NEGATIVE SARS-CoV-2 target nucleic acids are NOT DETECTED. The SARS-CoV-2 RNA is generally detectable in upper and lower  respiratory specimens during the acute phase of infection. The lowest  concentration  of SARS-CoV-2 viral copies this assay can detect is 250  copies / mL. A negative result does not preclude SARS-CoV-2 infection  and should not be used as the sole basis for treatment or other  patient management decisions.  A negative result may occur with  improper specimen collection / handling, submission of specimen other  than nasopharyngeal swab, presence of viral mutation(s) within the  areas targeted by this assay, and inadequate number of viral copies  (<250 copies / mL). A  negative result must be combined with clinical  observations, patient history, and epidemiological information. If result is POSITIVE SARS-CoV-2 target nucleic acids are DETECTED. The SARS-CoV-2 RNA is generally detectable in upper and lower  respiratory specimens dur ing the acute phase of infection.  Positive  results are indicative of active infection with SARS-CoV-2.  Clinical  correlation with patient history and other diagnostic information is  necessary to determine patient infection status.  Positive results do  not rule out bacterial infection or co-infection with other viruses. If result is PRESUMPTIVE POSTIVE SARS-CoV-2 nucleic acids MAY BE PRESENT.   A presumptive positive result was obtained on the submitted specimen  and confirmed on repeat testing.  While 2019 novel coronavirus  (SARS-CoV-2) nucleic acids may be present in the submitted sample  additional confirmatory testing may be necessary for epidemiological  and / or clinical management purposes  to differentiate between  SARS-CoV-2 and other Sarbecovirus currently known to infect humans.  If clinically indicated additional testing with an alternate test  methodology [phone]) is advised. The SARS-CoV-2 RNA is generally  detectable in upper and lower respiratory sp ecimens during the acute  phase of infection. The expected result is Negative. Fact Sheet for Patients:  BoilerBrush.com.cy Fact Sheet for Healthcare Providers: https://pope.com/ This test is not yet approved or cleared by the [place] FDA and has been authorized for detection and/or diagnosis of SARS-CoV-2 by FDA under an Emergency Use Authorization (EUA).  This EUA will remain in effect (meaning this test can be used) for the duration of the COVID-19 declaration under Section 564(b)(1) of the Act, 21 U.S.C. section 360bbb-3(b)(1), unless the authorization is terminated or revoked sooner. Performed  at [facility], [address], [place], [place] [address]      Studies: No results found.  Scheduled Meds: . aspirin EC  81 mg Oral Daily  . atorvastatin  40 mg Oral q1800  . enoxaparin (LOVENOX) injection  40 mg Subcutaneous Q24H  . furosemide  40 mg Oral Daily  . hydrALAZINE  10 mg Oral TID  . levothyroxine  150 mcg Oral Q0600  . lisinopril  10 mg Oral Daily  . pantoprazole  40 mg Oral Daily  . sodium chloride flush  3 mL Intravenous Q12H  . sodium chloride flush  3 mL Intravenous Q12H  . umeclidinium-vilanterol  1 puff Inhalation Daily   Continuous Infusions: . sodium chloride    . sodium chloride 50 mL/hr at 05/17/19 1138    Principal Problem:   Aortic regurgitation Active Problems:   Mitral valve regurgitation   Chest pain   Acute kidney injury (HCC)   Hypothyroidism   COPD (chronic obstructive pulmonary disease) (HCC)    Time spent: 45 minutes    [facility] M NP  [facility]  If 7PM-7AM, please contact night-coverage at [email or web address], password [facility] 05/17/2019, 12:56 PM  LOS: 1 day

## 2019-05-17 NOTE — Progress Notes (Signed)
TR BAND REMOVAL  LOCATION:  right radial  DEFLATED PER PROTOCOL:  Yes.    TIME BAND OFF / DRESSING APPLIED:   1845   SITE UPON ARRIVAL:   Level 0  SITE AFTER BAND REMOVAL:  Level 0  CIRCULATION SENSATION AND MOVEMENT:  Within Normal Limits  Yes.    COMMENTS:    

## 2019-05-17 NOTE — Consult Note (Signed)
[facility] Consult Note   Primary Physician:  Serita Grammes, MD Primary Cardiologist:   Shirlee More, MD  Reason for Consultation: Chest pain, mod-severe AR on echo  HPI:    Dennis Gomez is a 68 year old male with a past medical history significant for COPD, tobacco abuse and hypothyroidism who presented to [facility] on 05/14/2019 for complaints of off-and-on chest pain.  The patient states that after a heavy meal (barbecued chicken) on Sunday night he developed pressure-like chest pain.  He also felt diaphoretic.  He has infrequently had similar symptoms in the past also associated with meals.  He states that he feels a head rush when he straightens up after leaning forward.  More recently he has also noted some leg swelling.  He is not very active and retired 3 years ago.  He does some odd chores around the house.  He does not endorse any orthopnea or paroxysmal nocturnal dyspnea.  There are no palpitations, presyncope or syncope.  His echocardiogram on 05/15/2019 (at [facility]) revealed a severely dilated left ventricle with moderate concentric LVH.  The ejection fraction was 35 to 40%.  There was evidence of moderate to severe aortic regurgitation.  His lab work revealed a troponin of 0.24 [place]NP [place] 22025.  He does have a wide pulse pressure on examination.  The patient is now being transferred to [facility] for further evaluation of the severity of his aortic valve disease.   Home Medications Prior to Admission medications   Not on File    Past Medical History: Past Medical History:  Diagnosis Date  . Aortic regurgitation   . COPD (chronic obstructive pulmonary disease) ([place])   . Hypothyroid   . Mitral regurgitation     Past Surgical History: History reviewed. No pertinent surgical history.  Family History: Family History  Problem Relation Age of Onset  . Cancer Mother   . COPD Father     Social History: Social History    Socioeconomic History  . Marital status: Married    Spouse name: Not on file  . Number of children: Not on file  . Years of education: Not on file  . Highest education level: Not on file  Occupational History  . Not on file  Social Needs  . Financial resource strain: Not on file  . Food insecurity    Worry: Not on file    Inability: Not on file  . Transportation needs    Medical: Not on file    Non-medical: Not on file  Tobacco Use  . Smoking status: Current Every Day Smoker    Types: Cigarettes  . Smokeless tobacco: Never Used  Substance and Sexual Activity  . Alcohol use: Not Currently  . Drug use: Not on file  . Sexual activity: Not on file  Lifestyle  . Physical activity    Days per week: Not on file    Minutes per session: Not on file  . Stress: Not on file  Relationships  . Social Herbalist on phone: Not on file    Gets together: Not on file    Attends religious service: Not on file    Active member of club or organization: Not on file    Attends meetings of clubs or organizations: Not on file    Relationship status: Not on file  Other Topics Concern  . Not on file  Social History Narrative  . Not on file    Allergies:  No Known Allergies   Review of Systems: [y] = yes, [ ]  = no   . General: Weight gain [ ] ; Weight loss [ ] ; Anorexia [ ] ; Fatigue [ ] ; Fever [ ] ; Chills [ ] ; Weakness [ ]   . Cardiac: Chest pain/pressure [Y]; Resting SOB [ ] ; Exertional SOB [ ] ; Orthopnea [ ] ; Pedal Edema [Y]; Palpitations [ ] ; Syncope [ ] ; Presyncope [ ] ; Paroxysmal nocturnal dyspnea[ ]   . Pulmonary: Cough [ ] ; Wheezing[ ] ; Hemoptysis[ ] ; Sputum [ ] ; Snoring [ ]   . GI: Vomiting[ ] ; Dysphagia[ ] ; Melena[ ] ; Hematochezia [ ] ; Heartburn[ ] ; Abdominal pain [ ] ; Constipation [ ] ; Diarrhea [ ] ; BRBPR [ ]   . GU: Hematuria[ ] ; Dysuria [ ] ; Nocturia[ ]   . Vascular: Pain in legs with walking [ ] ; Pain in feet with lying flat [ ] ; Non-healing sores [ ] ; Stroke [ ] ; TIA [ ] ;  Slurred speech [ ] ;  . Neuro: Headaches[ ] ; Vertigo[ ] ; Seizures[ ] ; Paresthesias[ ] ;Blurred vision [ ] ; Diplopia [ ] ; Vision changes [ ]   . Ortho/Skin: Arthritis [ ] ; Joint pain [ ] ; Muscle pain [ ] ; Joint swelling [ ] ; Back Pain [ ] ; Rash [ ]   . Psych: Depression[ ] ; Anxiety[ ]   . Heme: Bleeding problems [ ] ; Clotting disorders [ ] ; Anemia [ ]   . Endocrine: Diabetes [ ] ; Thyroid dysfunction[ ]      Objective:    Vital Signs:   Temp:  [99.3 F (37.4 C)] 99.3 F (37.4 C) (07/30 2036) Pulse Rate:  [63] 63 (07/30 2036) Resp:  [18] 18 (07/30 2036) BP: (130-153)/(39-45) 130/39 (07/30 2313) SpO2:  [93 %] 93 % (07/30 2036) Weight:  [59.8 kg] 59.8 kg (07/30 2036)    Weight change: Filed Weights   05/16/19 2036  Weight: 59.8 kg    Intake/Output:  No intake or output data in the 24 hours ending 05/17/19 0030    Physical Exam    General:  Well appearing. No resp difficulty HEENT: normal Neck: supple. JVP . Carotids 2+ bilat; no bruits. No lymphadenopathy or thyromegaly appreciated. Cor: PMI nondisplaced. Regular rate & rhythm. Holosystolic murmur present. Lungs: clear to auscultation bilaterally Abdomen: soft, nontender, nondistended. No hepatosplenomegaly. No bruits or masses. Good bowel sounds. Extremities: no cyanosis, clubbing, rash, edema Neuro: alert & orientedx3, cranial nerves grossly intact. moves all 4 extremities w/o difficulty.  Affect pleasant, slow to answer questions    Labs   Basic Metabolic Panel: No results for input(s): NA, K, CL, CO2, GLUCOSE, BUN, CREATININE, CALCIUM, MG, PHOS in the last 168 hours.  Liver Function Tests: No results for input(s): AST, ALT, ALKPHOS, BILITOT, PROT, ALBUMIN in the last 168 hours. No results for input(s): LIPASE, AMYLASE in the last 168 hours. No results for input(s): AMMONIA in the last 168 hours.  CBC: No results for input(s): WBC, NEUTROABS, HGB, HCT, MCV, PLT in the last 168 hours.  Cardiac Enzymes: No results for  input(s): CKTOTAL, CKMB, CKMBINDEX, TROPONINI in the last 168 hours.  BNP: BNP (last 3 results) No results for input(s): BNP in the last 8760 hours.  ProBNP (last 3 results) No results for input(s): PROBNP in the last 8760 hours.   CBG: No results for input(s): GLUCAP in the last 168 hours.  Coagulation Studies: No results for input(s): LABPROT, INR in the last 72 hours.   Imaging    No results found.   Medications:     Current Medications: . aspirin EC  81 mg Oral Daily  .  atorvastatin  40 mg Oral q1800  . enoxaparin (LOVENOX) injection  40 mg Subcutaneous Q24H  . furosemide  40 mg Oral Daily  . hydrALAZINE  10 mg Oral TID  . levothyroxine  150 mcg Oral Q0600  . lisinopril  10 mg Oral Daily  . pantoprazole  40 mg Oral Daily  . sodium chloride flush  3 mL Intravenous Q12H  . umeclidinium-vilanterol  1 puff Inhalation Daily     Infusions: . sodium chloride         Assessment/Plan   1. Systolic LV dysfunction (Newly diagnosed) with mod-severe AR on echo  The patient presents with an episode of chest pain to an outside hospital.  His echocardiogram revealed a severely dilated left ventricle with a reduced ejection fraction.  There is also documentation of moderate to severe aortic regurgitation.  He is now transferred to [facility] for further evaluation of the severity of his aortic valve disease.    - Admit to telemetry - Serial ECGs - Repeat transthoracic echocardiogram / ? TEE - Daily weights, strict I's and O's, PRN Lasix - Continue lisinopril and hydralazine for reduction of systemic vascular resistance - We will hold off on starting beta-blockers for now given their controversial role in severe acute/subacute aortic regurgitation (due to their effect on prolonging diastole).  Can readdress this issue once his valvular evaluation is completed.    Lonie Peak, MD  05/17/2019, 12:30 AM  Cardiology Overnight Team Please contact [facility]  Cardiology for night-coverage after hours (4p -7a ) and weekends on [email or web address]

## 2019-05-17 NOTE — Interval H&P Note (Signed)
History and Physical Interval Note:  05/17/2019 2:51 PM  Dennis Gomez  has presented today for surgery, with the diagnosis of Chest pain.  The various methods of treatment have been discussed with the patient and family. After consideration of risks, benefits and other options for treatment, the patient has consented to  Procedure(s): RIGHT/LEFT HEART CATH AND CORONARY ANGIOGRAPHY (N/A) as a surgical intervention.  The patient's history has been reviewed, patient examined, no change in status, stable for surgery.  I have reviewed the patient's chart and labs.  Questions were answered to the patient's satisfaction.     Sherren Mocha

## 2019-05-18 ENCOUNTER — Encounter (HOSPITAL_COMMUNITY): Payer: Self-pay | Admitting: Thoracic Surgery (Cardiothoracic Vascular Surgery)

## 2019-05-18 ENCOUNTER — Inpatient Hospital Stay (HOSPITAL_COMMUNITY): Payer: Medicare Other

## 2019-05-18 DIAGNOSIS — I25118 Atherosclerotic heart disease of native coronary artery with other forms of angina pectoris: Secondary | ICD-10-CM

## 2019-05-18 DIAGNOSIS — I10 Essential (primary) hypertension: Secondary | ICD-10-CM

## 2019-05-18 DIAGNOSIS — I25119 Atherosclerotic heart disease of native coronary artery with unspecified angina pectoris: Secondary | ICD-10-CM | POA: Diagnosis present

## 2019-05-18 DIAGNOSIS — Z720 Tobacco use: Secondary | ICD-10-CM | POA: Diagnosis present

## 2019-05-18 DIAGNOSIS — I25708 Atherosclerosis of coronary artery bypass graft(s), unspecified, with other forms of angina pectoris: Secondary | ICD-10-CM

## 2019-05-18 DIAGNOSIS — I2510 Atherosclerotic heart disease of native coronary artery without angina pectoris: Secondary | ICD-10-CM

## 2019-05-18 DIAGNOSIS — I350 Nonrheumatic aortic (valve) stenosis: Secondary | ICD-10-CM

## 2019-05-18 DIAGNOSIS — J449 Chronic obstructive pulmonary disease, unspecified: Secondary | ICD-10-CM

## 2019-05-18 DIAGNOSIS — I5022 Chronic systolic (congestive) heart failure: Secondary | ICD-10-CM

## 2019-05-18 DIAGNOSIS — N183 Chronic kidney disease, stage 3 unspecified: Secondary | ICD-10-CM | POA: Diagnosis present

## 2019-05-18 DIAGNOSIS — E785 Hyperlipidemia, unspecified: Secondary | ICD-10-CM

## 2019-05-18 LAB — COMPREHENSIVE METABOLIC PANEL
ALT: 11 U/L (ref 0–44)
AST: 24 U/L (ref 15–41)
Albumin: 3.8 g/dL (ref 3.5–5.0)
Alkaline Phosphatase: 59 U/L (ref 38–126)
Anion gap: 13 (ref 5–15)
BUN: 42 mg/dL — ABNORMAL HIGH (ref 8–23)
CO2: 25 mmol/L (ref 22–32)
Calcium: 9.5 mg/dL (ref 8.9–10.3)
Chloride: 102 mmol/L (ref 98–111)
Creatinine, Ser: 1.37 mg/dL — ABNORMAL HIGH (ref 0.61–1.24)
GFR calc Af Amer: >60 mL/min (ref >60)
GFR calc non Af Amer: 54 mL/min — ABNORMAL LOW (ref >60)
Glucose, Bld: 82 mg/dL (ref 70–99)
Potassium: 3.7 mmol/L (ref 3.5–5.1)
Sodium: 140 mmol/L (ref 135–145)
Total Bilirubin: 1 mg/dL (ref 0.3–1.2)
Total Protein: 7.2 g/dL (ref 6.5–8.1)

## 2019-05-18 LAB — CBC
HCT: 33.1 % — ABNORMAL LOW (ref 39.0–52.0)
Hemoglobin: 10.9 g/dL — ABNORMAL LOW (ref 13.0–17.0)
MCH: 31.3 pg (ref 26.0–34.0)
MCHC: 32.9 g/dL (ref 30.0–36.0)
MCV: 95.1 fL (ref 80.0–100.0)
Platelets: 185 10*3/uL (ref 150–400)
RBC: 3.48 MIL/uL — ABNORMAL LOW (ref 4.22–5.81)
RDW: 16.2 % — ABNORMAL HIGH (ref 11.5–15.5)
WBC: 8.2 10*3/uL (ref 4.0–10.5)
nRBC: 0 % (ref 0.0–0.2)

## 2019-05-18 LAB — HEMOGLOBIN A1C
Hgb A1c MFr Bld: 5.6 % (ref 4.8–5.6)
Mean Plasma Glucose: 114.02 mg/dL

## 2019-05-18 LAB — PREALBUMIN: Prealbumin: 20.8 mg/dL (ref 18–38)

## 2019-05-18 NOTE — Progress Notes (Signed)
Pre-op CABG teaching completed today in anticipation of CABG.  Patient able to get out of bed and chair not using his arms to stay in the tube to protect sternal incision.  Strongly encouraged to continue being tobacco free after discharge. Discussed importance of ambulation and IS use. [phone]

## 2019-05-18 NOTE — Progress Notes (Addendum)
Progress Note  Patient Name: Dennis Gomez Date of Encounter: 05/18/2019  Primary Cardiologist: Bettina Gavia (new)  Subjective   Denies chest pain and shortness of breath. Had coughed up some blood.  Inpatient Medications    Scheduled Meds:  aspirin EC  81 mg Oral Daily   atorvastatin  40 mg Oral q1800   enoxaparin (LOVENOX) injection  40 mg Subcutaneous Q24H   furosemide  40 mg Oral Daily   hydrALAZINE  10 mg Oral TID   levothyroxine  150 mcg Oral Q0600   lisinopril  10 mg Oral Daily   pantoprazole  40 mg Oral Daily   sodium chloride flush  3 mL Intravenous Q12H   sodium chloride flush  3 mL Intravenous Q12H   sodium chloride flush  3 mL Intravenous Q12H   umeclidinium-vilanterol  1 puff Inhalation Daily   Continuous Infusions:  sodium chloride     sodium chloride     PRN Meds: sodium chloride, sodium chloride, acetaminophen **OR** acetaminophen, albuterol, nicotine, ondansetron (ZOFRAN) IV, sodium chloride flush, sodium chloride flush   Vital Signs    Vitals:   05/17/19 1830 05/17/19 2137 05/18/19 0608 05/18/19 0616  BP: (!) 139/46 (!) 138/51 (!) 136/36 (!) 137/42  Pulse:  64 (!) 53   Resp: 13 17 13 18   Temp:  98.1 F (36.7 C) 97.6 F (36.4 C)   TempSrc:  Oral Oral   SpO2:  92% 100%   Weight:   56.9 kg   Height:        Intake/Output Summary (Last 24 hours) at 05/18/2019 0848 Last data filed at 05/18/2019 0616 Gross per 24 hour  Intake 657.4 ml  Output 975 ml  Net -317.6 ml   Filed Weights   05/16/19 2036 05/17/19 0500 05/18/19 5621  Weight: 59.8 kg 59.6 kg 56.9 kg    Telemetry    Sinus rhythm/sinus bradycardia - Personally Reviewed  ECG    NA - Personally Reviewed  Physical Exam   GEN: No acute distress.   Neck: No JVD Cardiac: RRR, 2/6 systolic over RUSB, 3/4 holodiastolic murmur along RSB,no rubs, or gallops.  Respiratory: Diffusely diminished breath sounds, no crackles or wheezes. GI: Soft, nontender, non-distended  MS: No  edema; No deformity. Neuro:  Nonfocal  Psych: Normal affect   Labs    Chemistry Recent Labs  Lab 05/17/19 0436 05/17/19 1508 05/18/19 0328  NA 139 141   143 140  K 3.7 3.6   3.5 3.7  CL 100  --  102  CO2 27  --  25  GLUCOSE 83  --  82  BUN 36*  --  42*  CREATININE 1.46*  --  1.37*  CALCIUM 10.1  --  9.5  PROT 8.0  --  7.2  ALBUMIN 4.2  --  3.8  AST 27  --  24  ALT 14  --  11  ALKPHOS 65  --  59  BILITOT 0.9  --  1.0  GFRNONAA 50*  --  54*  GFRAA 58*  --  >60  ANIONGAP 12  --  13     Hematology Recent Labs  Lab 05/17/19 0436 05/17/19 1508 05/18/19 0328  WBC 6.7  --  8.2  RBC 4.06*  --  3.48*  HGB 12.6* 10.9*   10.5* 10.9*  HCT 38.2* 32.0*   31.0* 33.1*  MCV 94.1  --  95.1  MCH 31.0  --  31.3  MCHC 33.0  --  32.9  RDW 16.2*  --  16.2*  PLT 231  --  185    Cardiac EnzymesNo results for input(s): TROPONINI in the last 168 hours. No results for input(s): TROPIPOC in the last 168 hours.   BNPNo results for input(s): BNP, PROBNP in the last 168 hours.   DDimer No results for input(s): DDIMER in the last 168 hours.   Radiology    Dg Chest 2 View  Result Date: 05/18/2019 CLINICAL DATA:  68 year old male with recent chest pain. Productive cough. EXAM: CHEST - 2 VIEW COMPARISON:  05/14/2019 radiographs and CTA. FINDINGS: Stable lung volumes and mediastinal contours. Visualized tracheal air column is within normal limits. No pneumothorax, pulmonary edema or definite pleural effusion. No confluent pulmonary opacity. Osteopenia. No acute osseous abnormality identified. Negative visible bowel gas pattern. IMPRESSION: No acute cardiopulmonary abnormality. Electronically Signed   By: Odessa FlemingH  Hall M.D.   On: 05/18/2019 07:49    Cardiac Studies   Echo 05/17/19:  IMPRESSIONS    1. The left ventricle has mildly reduced systolic function, with an ejection fraction of 45-50%. The cavity size was mild to moderately dilated. There is mild concentric left ventricular hypertrophy.  Left ventricular diastolic Doppler parameters are  indeterminate. Left ventricular diffuse hypokinesis.  2. Diffuse hypokinesis in basal to mid segments, slightly improved at apex. In multiple coronary territories.  3. The right ventricle has normal systolic function. The cavity was normal. There is no increase in right ventricular wall thickness. Right ventricular systolic pressure could not be assessed.  4. Left atrial size was mildly dilated.  5. Right atrial size was mildly dilated.  6. Trivial pericardial effusion is present.  7. The mitral valve is abnormal.  8. The aortic valve is abnormal. Aortic valve regurgitation is moderate to severe by color flow Doppler.  9. Eccentric aortic regurgitation seen, affecting mitral valve without clear preclosure. Likely at least moderate and possibly severe regurgitation. 10. The aorta is abnormal in size and structure. 11. The aortic arch is normal in size and structure. 12. There is mild dilatation of the aortic root and of the ascending aorta measuring 40 mm.   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 Patent vessel without significant stenosis  Left Anterior Descending  Prox LAD to Mid LAD lesion 80% stenosed  Prox LAD to Mid LAD lesion is 80% stenosed. The lesion is eccentric. there is severe proximal LAD stenosis involving the first diagonal with disease extending beyond the diagonal branch with a very tight ostial diagonal stenosis  First Diagonal Branch  1st Diag lesion 80% stenosed  1st Diag lesion is 80% stenosed.  Ramus Intermedius  Vessel is small.  Ramus lesion 80% stenosed  Ramus lesion is 80%  stenosed. Tiny vessel not amenable to revascularization  Left Circumflex  Prox Cx to Mid Cx lesion 40% stenosed  Prox Cx to Mid Cx lesion is 40% stenosed.  Dist Cx lesion 70% stenosed with side branch in LPAV 70% stenosed  Dist Cx lesion is 70% stenosed with 70% stenosed side branch in LPAV. diffuse disease is present in the distal AV circumflex at the origin of the PLA and PDA branches (dominant LCx)  First Obtuse Marginal Branch  1st Mrg-1 lesion 75% stenosed  1st Mrg-1 lesion  is 75% stenosed. moderate caliber vessel, likely suitable for grafting  1st Mrg-2 lesion 80% stenosed  1st Mrg-2 lesion is 80% stenosed.  Left Posterior Descending Artery  LPDA lesion 75% stenosed  LPDA lesion is 75% stenosed.  Right Coronary Artery  Vessel is small. No significant disease. Small vessel.        Patient Profile     68 y.o. male a past medical history significant for COPD, tobacco abuse and hypothyroidism who presented to [facility]/14/2019 for complaints of off-and-on chest pain.  Echo with EF 35-40% and moderate to severe AR.  Troponin 0.24  BNP 17900.  Found to have severe multivessel CAD, moderate to severe AR, and EF 45-50%.  Assessment & Plan    1. 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 Currently denies anginal symptoms. Continue ASA and statin. Beta blocker held due to COPD and resting bradycardia (HR mid 50's at present). CT surgery to see.  2. Cardiomyopathy/chronic systolic HF: EF 45-50%. Euvolemic. On Lasix 40 mg and lisinopril. No beta blockers due to COPD and resting bradycardia (HR mid 50's at present).  3. HTN: BP normal. No changes.  4. Aortic regurgitation: Moderate to severe. Given need for CABG will need AVR. CT surgery to see.  5. HLD: On statin. LDL 174.        For questions or updates, please contact [facility] Please consult [email or web address] for contact info under Cardiology/STEMI.      Signed, Prentice Docker, MD  05/18/2019, 8:48 AM

## 2019-05-18 NOTE — Progress Notes (Addendum)
[facility] PROGRESS NOTE  Dennis ChimeMelvin Gomez ZHY:[id number] DOB: 10/09/1951 DOA: 05/16/2019 PCP: Buckner MaltaBurgart, Jennifer, MD    Dennis ChimeMelvin Gomez  is a 68 y.o. male,   w hypothyroidism, Copd, CHF (EF 35-40%), apparently admitted for chest pain on 05/14/2019 at [facility].  Chest pain w/up  included echocardiogram that revealed new systolic with combined diastolic congestive heart failure with an EF of 35-40% with moderate to severe aortic regurgitation seen.  Cardiology consulted and recommended [facility] for cardiac catheter[facility] and possible surgical consult for  aortic  regurgitation. CAth done with severe 3 vessel disease.  Needs surgical evaluation.  CVTS consulted.  Assessment/Plan:  CAD -cath shows severe multivessel disease and in need of CABG and valve repair.   -ASA/statin -no BB as HR low -CT surgery consult placed yesterday  Aortic regurgitation/Mitral regurgitation  -evaluated by cards: repeating echo, right and left heart cath today.  -Given need for CABG will need AVR  Cardiomyopathy  -EF 45-50 now -Cont lisinopril 10mg  po qday -Cont hydralazine 10mg  po tid,   Acute kidney injury -baseline unknown -trending down  Hypothyroidism Cont Levothyroxine at 150 micrograms po qday   Hyperlipidemia (LDL =174) Cont Lipitor 40mg  po qhs  Copd stable at baseline. Not on home oxygen -duoneb prn -Start Anoro 1puff qday -Albuterol neb q6h prn   Anemia- unknown (perhaps CKD) -check Fe studies in aM  Tobacco abuse Nicotine patch PRN  Code Status: full Family Communication: patient-- called son [name] Disposition Plan: needs CABG/AVR   Consultants:  CVTS  cards    HPI/Subjective: No overnight events  Objective: Vitals:   05/18/19 0616 05/18/19 1006  BP: (!) 137/42 (!) 144/42  Pulse:    Resp: 18   Temp:    SpO2:      Intake/Output Summary (Last 24 hours) at 05/18/2019 1031 Last data filed at 05/18/2019 1008 Gross per 24 hour  Intake  660.4 ml  Output 750 ml  Net -89.6 ml   Filed Weights   05/16/19 2036 05/17/19 0500 05/18/19 0608  Weight: 59.8 kg 59.6 kg 56.9 kg    Exam:   General:  In bed, NAD  Cardiovascular: rrr, + murmur  Respiratory: no increased work of breathing, rrr  Abdomen: +BS, soft, NT   Data Reviewed: Basic Metabolic Panel: Recent Labs  Lab 05/17/19 0436 05/17/19 1508 05/18/19 0328  NA 139 141  143 140  K 3.7 3.6  3.5 3.7  CL 100  --  102  CO2 27  --  25  GLUCOSE 83  --  82  BUN 36*  --  42*  CREATININE 1.46*  --  1.37*  CALCIUM 10.1  --  9.5   Liver Function Tests: Recent Labs  Lab 05/17/19 0436 05/18/19 0328  AST 27 24  ALT 14 11  ALKPHOS 65 59  BILITOT 0.9 1.0  PROT 8.0 7.2  ALBUMIN 4.2 3.8   No results for input(s): LIPASE, AMYLASE in the last 168 hours. No results for input(s): AMMONIA in the last 168 hours. CBC: Recent Labs  Lab 05/17/19 0436 05/17/19 1508 05/18/19 0328  WBC 6.7  --  8.2  HGB 12.6* 10.9*  10.5* 10.9*  HCT 38.2* 32.0*  31.0* 33.1*  MCV 94.1  --  95.1  PLT 231  --  185   Cardiac Enzymes: No results for input(s): CKTOTAL, CKMB, CKMBINDEX, TROPONINI in the last 168 hours. BNP (last 3 results) No results for input(s): BNP in the last 8760 hours.  ProBNP (last 3 results)  No results for input(s): PROBNP in the last 8760 hours.  CBG: No results for input(s): GLUCAP in the last 168 hours.  Recent Results (from the past 240 hour(s))  SARS Coronavirus 2 (CEPHEID - Performed in [place] hospital lab), Hosp Order     Status: None   Collection Time: 05/16/19 11:41 PM   Specimen: Nasopharyngeal Swab  Result Value Ref Range Status   SARS Coronavirus 2 NEGATIVE NEGATIVE Final    Comment: (NOTE) If result is NEGATIVE SARS-CoV-2 target nucleic acids are NOT DETECTED. The SARS-CoV-2 RNA is generally detectable in upper and lower  respiratory specimens during the acute phase of infection. The lowest  concentration of SARS-CoV-2 viral copies  this assay can detect is 250  copies / mL. A negative result does not preclude SARS-CoV-2 infection  and should not be used as the sole basis for treatment or other  patient management decisions.  A negative result may occur with  improper specimen collection / handling, submission of specimen other  than nasopharyngeal swab, presence of viral mutation(s) within the  areas targeted by this assay, and inadequate number of viral copies  (<250 copies / mL). A negative result must be combined with clinical  observations, patient history, and epidemiological information. If result is POSITIVE SARS-CoV-2 target nucleic acids are DETECTED. The SARS-CoV-2 RNA is generally detectable in upper and lower  respiratory specimens dur ing the acute phase of infection.  Positive  results are indicative of active infection with SARS-CoV-2.  Clinical  correlation with patient history and other diagnostic information is  necessary to determine patient infection status.  Positive results do  not rule out bacterial infection or co-infection with other viruses. If result is PRESUMPTIVE POSTIVE SARS-CoV-2 nucleic acids MAY BE PRESENT.   A presumptive positive result was obtained on the submitted specimen  and confirmed on repeat testing.  While 2019 novel coronavirus  (SARS-CoV-2) nucleic acids may be present in the submitted sample  additional confirmatory testing may be necessary for epidemiological  and / or clinical management purposes  to differentiate between  SARS-CoV-2 and other Sarbecovirus currently known to infect humans.  If clinically indicated additional testing with an alternate test  methodology [phone]) is advised. The SARS-CoV-2 RNA is generally  detectable in upper and lower respiratory sp ecimens during the acute  phase of infection. The expected result is Negative. Fact Sheet for Patients:  StrictlyIdeas.no Fact Sheet for Healthcare  Providers: BankingDealers.co.za This test is not yet approved or cleared by the [place] FDA and has been authorized for detection and/or diagnosis of SARS-CoV-2 by FDA under an Emergency Use Authorization (EUA).  This EUA will remain in effect (meaning this test can be used) for the duration of the COVID-19 declaration under Section 564(b)(1) of the Act, 21 U.S.C. section 360bbb-3(b)(1), unless the authorization is terminated or revoked sooner. Performed at [facility], [place] [address] [place], [place] [address]      Studies: Dg Chest 2 View  Result Date: 05/18/2019 CLINICAL DATA:  68 year old male with recent chest pain. Productive cough. EXAM: CHEST - 2 VIEW COMPARISON:  05/14/2019 radiographs and CTA. FINDINGS: Stable lung volumes and mediastinal contours. Visualized tracheal air column is within normal limits. No pneumothorax, pulmonary edema or definite pleural effusion. No confluent pulmonary opacity. Osteopenia. No acute osseous abnormality identified. Negative visible bowel gas pattern. IMPRESSION: No acute cardiopulmonary abnormality. Electronically Signed   By: Genevie Ann M.D.   On: 05/18/2019 07:49    Scheduled Meds: .  aspirin EC  81 mg Oral Daily  . atorvastatin  40 mg Oral q1800  . enoxaparin (LOVENOX) injection  40 mg Subcutaneous Q24H  . furosemide  40 mg Oral Daily  . hydrALAZINE  10 mg Oral TID  . levothyroxine  150 mcg Oral Q0600  . lisinopril  10 mg Oral Daily  . pantoprazole  40 mg Oral Daily  . sodium chloride flush  3 mL Intravenous Q12H  . sodium chloride flush  3 mL Intravenous Q12H  . sodium chloride flush  3 mL Intravenous Q12H  . umeclidinium-vilanterol  1 puff Inhalation Daily   Continuous Infusions: . sodium chloride    . sodium chloride      Principal Problem:   Aortic regurgitation Active Problems:   Hypothyroidism   Mitral valve regurgitation   COPD (chronic obstructive pulmonary disease) (HCC)   Chest pain    Acute kidney injury (HCC)    Time spent: 25 minutes    Joseph Art DO [facility]  If 7PM-7AM, please contact night-coverage at [email or web address], password [facility] 05/18/2019, 10:31 AM  LOS: 2 days

## 2019-05-18 NOTE — Consult Note (Signed)
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  Patient denies any known history of heart disease, heart murmur, or history of rheumatic fever.  He has a long history of tobacco abuse, continues to smoke 1 pack cigarettes daily and has been diagnosed with COPD.  He denies any known history of hyperlipidemia or strong family history of coronary artery disease.  He states that for the past year or more he has been experiencing substernal chest discomfort with physical exertion.  Symptoms have progressed recently and occasionally occur when he bends over at the waist or stands up.  Symptoms are always relieved by rest.  More recently symptoms have occurred following meals and become associated with diaphoresis.  He denies any associated symptoms of exertional shortness of breath, resting shortness of breath, PND, or orthopnea.  He has recently developed some mild lower extremity edema.  He presented to [facility] May 14, 2019 with complaints of similar chest pain.  Troponin levels were reportedly minimally elevated but BNP level is elevated 17,900.  Echocardiogram there revealed dilated left ventricle with moderate to severe left ventricular systolic dysfunction, ejection fraction estimated 35 to 40%.  He was noted to have severe aortic insufficiency.  He was transferred to [facility] for further management.  Subsequent  transthoracic echocardiogram confirmed the presence of moderate to severe left ventricular chamber enlargement and global left ventricular systolic dysfunction.  There was moderate to severe aortic insufficiency.  Left and right heart catheterization performed by Dr. Excell Seltzer revealed multivessel coronary artery disease with normal right heart pressures and cardiac output.  Cardiothoracic surgical consultation was requested.  Patient is married and lives locally in [place] with his wife.  He has been retired for approximately 3 years.  He has remained reasonably active in retirement although he admits that he has been slowing down because of symptoms of chest discomfort and pain and weakness in his legs brought on with exertion.  He still performs chores around the house, works in the yard, and [employer].  He has recently had some lower extremity edema.  He denies resting shortness of breath, PND, orthopnea.  He describes tightness across his chest that occurs with exertion or following meals.  He describes aching-like pain in both legs primarily in the muscles below his knees with ambulation but relieved by rest.    Past Medical History:  Diagnosis Date   Aortic regurgitation    COPD (chronic obstructive pulmonary disease) (HCC)    Hypothyroid    Mitral regurgitation     History reviewed. No pertinent surgical history.  Family History  Problem Relation Age of Onset   Cancer Mother    COPD Father     Social History   Socioeconomic History   Marital status: Married    Spouse name: Not on file   Number of children: Not on file   Years of education:  Not on file   Highest education level: Not on file  Occupational History   Not on file  Social Needs   Financial resource strain: Not on file   Food insecurity    Worry: Not on file    Inability: Not on file   Transportation needs    Medical: Not on file    Non-medical: Not on file  Tobacco Use   Smoking status:  Current Every Day Smoker    Types: Cigarettes   Smokeless tobacco: Never Used  Substance and Sexual Activity   Alcohol use: Not Currently   Drug use: Not on file   Sexual activity: Not on file  Lifestyle   Physical activity    Days per week: Not on file    Minutes per session: Not on file   Stress: Not on file  Relationships   Social connections    Talks on phone: Not on file    Gets together: Not on file    Attends religious service: Not on file    Active member of club or organization: Not on file    Attends meetings of clubs or organizations: Not on file    Relationship status: Not on file   Intimate partner violence    Fear of current or ex partner: Not on file    Emotionally abused: Not on file    Physically abused: Not on file    Forced sexual activity: Not on file  Other Topics Concern   Not on file  Social History Narrative   Not on file    Prior to Admission medications   Medication Sig Start Date End Date Taking? Authorizing Provider  levothyroxine (SYNTHROID) 100 MCG tablet Take 100 mcg by mouth daily. 05/09/19  Yes [provider]  spironolactone (ALDACTONE) 25 MG tablet Take 25 mg by mouth daily. 05/09/19  Yes [provider]  TRELEGY ELLIPTA 100-62.5-25 MCG/INH AEPB Take 1 puff by mouth daily. 05/14/19  Yes [provider]    Current Facility-Administered Medications  Medication Dose Route Frequency Provider Last Rate Last Dose   0.9 %  sodium chloride infusion  250 mL Intravenous PRN Pearson GrippeKim, James, MD       0.9 %  sodium chloride infusion  250 mL Intravenous PRN Tonny Bollmanooper, Michael, MD       acetaminophen (TYLENOL) tablet 650 mg  650 mg Oral Q6H PRN Pearson GrippeKim, James, MD       Or   acetaminophen (TYLENOL) suppository 650 mg  650 mg Rectal Q6H PRN Pearson GrippeKim, James, MD       albuterol (PROVENTIL) (2.5 MG/3ML) 0.083% nebulizer solution 2.5 mg  2.5 mg Nebulization Q6H PRN Pearson GrippeKim, James, MD       aspirin EC tablet 81 mg  81 mg Oral Daily Pearson GrippeKim,  James, MD   81 mg at 05/18/19 1006   atorvastatin (LIPITOR) tablet 40 mg  40 mg Oral q1800 Pearson GrippeKim, James, MD   40 mg at 05/17/19 1717   enoxaparin (LOVENOX) injection 40 mg  40 mg Subcutaneous Q24H Pearson GrippeKim, James, MD   40 mg at 05/17/19 2138   furosemide (LASIX) tablet 40 mg  40 mg Oral Daily Pearson GrippeKim, James, MD   40 mg at 05/18/19 1006   hydrALAZINE (APRESOLINE) tablet 10 mg  10 mg Oral TID Pearson GrippeKim, James, MD   10 mg at 05/18/19 1006   levothyroxine (SYNTHROID) tablet 150 mcg  150 mcg Oral Q0600 Pearson GrippeKim, James, MD   150 mcg at 05/18/19 0559   lisinopril (ZESTRIL)  tablet 10 mg  10 mg Oral Daily Pearson Grippe, MD   10 mg at 05/18/19 1007   nicotine (NICODERM CQ - dosed in mg/24 hours) patch 21 mg  21 mg Transdermal Daily PRN Pearson Grippe, MD       ondansetron [facility]) injection 4 mg  4 mg Intravenous Q6H PRN Tonny Bollman, MD       pantoprazole (PROTONIX) EC tablet 40 mg  40 mg Oral Daily Pearson Grippe, MD   40 mg at 05/18/19 1006   sodium chloride flush (NS) 0.9 % injection 3 mL  3 mL Intravenous Q12H Pearson Grippe, MD   3 mL at 05/17/19 2207   sodium chloride flush (NS) 0.9 % injection 3 mL  3 mL Intravenous PRN Pearson Grippe, MD       sodium chloride flush (NS) 0.9 % injection 3 mL  3 mL Intravenous Q12H Leone Brand, NP   3 mL at 05/17/19 1000   sodium chloride flush (NS) 0.9 % injection 3 mL  3 mL Intravenous Q12H Tonny Bollman, MD   3 mL at 05/18/19 1008   sodium chloride flush (NS) 0.9 % injection 3 mL  3 mL Intravenous PRN Tonny Bollman, MD       umeclidinium-vilanterol (ANORO ELLIPTA) 62.5-25 MCG/INH 1 puff  1 puff Inhalation Daily Pearson Grippe, MD   1 puff at 05/17/19 [phone]    No Known Allergies    Review of Systems:   General:  normal appetite, decreased energy, no weight gain, no weight loss, no fever  Cardiac:  + chest pain with exertion, occasional chest pain at rest, no SOB with exertion, no resting SOB, no PND, no orthopnea, no palpitations, no arrhythmia, no atrial fibrillation, + LE  edema, no dizzy spells, no syncope  Respiratory:  no shortness of breath, no home oxygen, no productive cough, no dry cough, no bronchitis, no wheezing, no hemoptysis, no asthma, no pain with inspiration or cough, no sleep apnea, no CPAP at night  GI:   no difficulty swallowing, no reflux, no frequent heartburn, no hiatal hernia, no abdominal pain, no constipation, no diarrhea, no hematochezia, no hematemesis, no melena  GU:   no dysuria,  no frequency, no urinary tract infection, no hematuria, no enlarged prostate, no kidney stones, + kidney disease  Vascular:  + pain suggestive of claudication, no pain in feet, no leg cramps, no varicose veins, no DVT, no non-healing foot ulcer  Neuro:   no stroke, no TIA's, no seizures, no headaches, no temporary blindness one eye,  no slurred speech, no peripheral neuropathy, no chronic pain, no instability of gait, no memory/cognitive dysfunction  Musculoskeletal: no arthritis, no joint swelling, no myalgias, no difficulty walking, normal mobility   Skin:   no rash, no itching, no skin infections, no pressure sores or ulcerations  Psych:   no anxiety, no depression, no nervousness, no unusual recent stress  Eyes:   no blurry vision, no floaters, no recent vision changes, does not wear glasses or contacts  ENT:   mild hearing loss, no loose or painful teeth, edentulous w/ dentures, last saw dentist several years ago  Hematologic:  no easy bruising, no abnormal bleeding, no clotting disorder, no frequent epistaxis  Endocrine:  no diabetes, does not check CBG's at home     Physical Exam:   BP (!) 144/42    Pulse (!) 53    Temp 97.6 F (36.4 C) (Oral)    Resp 18    Ht 5\' 5"  (1.651  m)    Wt 56.9 kg    SpO2 100%    BMI 20.87 kg/m   General:  Thin male appears older than stated age, NAD  HEENT:  Unremarkable   Neck:   no JVD, no bruits, no adenopathy   Chest:   clear to auscultation, symmetrical breath sounds, no wheezes, no rhonchi   CV:   RRR, grade III/VI  holodiastolic murmur   Abdomen:  soft, non-tender, no masses   Extremities:  warm, well-perfused, pulses diminished, no lower extremity edema  Rectal/GU  Deferred  Neuro:   Grossly non-focal and symmetrical throughout  Skin:   Clean and dry, no rashes, no breakdown  Diagnostic Tests:  Lab Results: Recent Labs    05/17/19 0436 05/17/19 1508 05/18/19 0328  WBC 6.7  --  8.2  HGB 12.6* 10.9*   10.5* 10.9*  HCT 38.2* 32.0*   31.0* 33.1*  PLT 231  --  185   BMET:  Recent Labs    05/17/19 0436 05/17/19 1508 05/18/19 0328  NA 139 141   143 140  K 3.7 3.6   3.5 3.7  CL 100  --  102  CO2 27  --  25  GLUCOSE 83  --  82  BUN 36*  --  42*  CREATININE 1.46*  --  1.37*  CALCIUM 10.1  --  9.5    CBG (last 3)  No results for input(s): GLUCAP in the last 72 hours. PT/INR:   Recent Labs    05/17/19 0436  LABPROT 15.9*  INR 1.3*    CXR:  CHEST - 2 VIEW  COMPARISON:  05/14/2019 radiographs and CTA.  FINDINGS: Stable lung volumes and mediastinal contours. Visualized tracheal air column is within normal limits. No pneumothorax, pulmonary edema or definite pleural effusion. No confluent pulmonary opacity. Osteopenia. No acute osseous abnormality identified. Negative visible bowel gas pattern.  IMPRESSION: No acute cardiopulmonary abnormality.   Electronically Signed   By: Odessa Fleming M.D.   On: 05/18/2019 07:49       ECHOCARDIOGRAM REPORT    Patient Name:   Dennis Gomez Date of Exam: 05/17/2019 Medical Rec #:  [id number]       Height:       65.0 in Accession #:    [id number]      Weight:       131.3 lb Date of Birth:  [birth year]        BSA:          1.65 m Patient Age:    67 years        BP:           123/52 mmHg Patient Gender: M               HR:           61 bpm. Exam Location:  Inpatient    Procedure: 2D Echo  Indications:    Aortic Valve Disorder 424.1 / I35.9   History:        Patient has prior history of Echocardiogram examinations, most                  recent 05/15/2019. Mitral valve regurgitation Signs/Symptoms:                 Chest Pain. Repeat echo ordered to assess Aortic insufficiency.   Sonographer:    Tobin Chad Referring Phys: [id number] LAURA R INGOLD  IMPRESSIONS    1. The left ventricle has mildly reduced  systolic function, with an ejection fraction of 45-50%. The cavity size was mild to moderately dilated. There is mild concentric left ventricular hypertrophy. Left ventricular diastolic Doppler parameters are  indeterminate. Left ventricular diffuse hypokinesis.  2. Diffuse hypokinesis in basal to mid segments, slightly improved at apex. In multiple coronary territories.  3. The right ventricle has normal systolic function. The cavity was normal. There is no increase in right ventricular wall thickness. Right ventricular systolic pressure could not be assessed.  4. Left atrial size was mildly dilated.  5. Right atrial size was mildly dilated.  6. Trivial pericardial effusion is present.  7. The mitral valve is abnormal.  8. The aortic valve is abnormal. Aortic valve regurgitation is moderate to severe by color flow Doppler.  9. Eccentric aortic regurgitation seen, affecting mitral valve without clear preclosure. Likely at least moderate and possibly severe regurgitation. 10. The aorta is abnormal in size and structure. 11. The aortic arch is normal in size and structure. 12. There is mild dilatation of the aortic root and of the ascending aorta measuring 40 mm.  FINDINGS  Left Ventricle: The left ventricle has mildly reduced systolic function, with an ejection fraction of 45-50%. The cavity size was mild to moderately dilated. There is mild concentric left ventricular hypertrophy. Left ventricular diastolic Doppler  parameters are indeterminate. Left ventricular diffuse hypokinesis. Diffuse hypokinesis in basal to mid segments, slightly improved at apex. In multiple coronary territories.  Right Ventricle: The right  ventricle has normal systolic function. The cavity was normal. There is no increase in right ventricular wall thickness. Right ventricular systolic pressure could not be assessed.  Left Atrium: Left atrial size was mildly dilated.  Right Atrium: Right atrial size was mildly dilated. Right atrial pressure is estimated at 3 mmHg.  Interatrial Septum: No atrial level shunt detected by color flow Doppler.  Pericardium: Trivial pericardial effusion is present.  Mitral Valve: The mitral valve is abnormal. Mitral valve regurgitation is mild by color flow Doppler.  Tricuspid Valve: The tricuspid valve is normal in structure. Tricuspid valve regurgitation was not visualized by color flow Doppler.  Aortic Valve: The aortic valve is abnormal Aortic valve regurgitation is moderate to severe by color flow Doppler. Eccentric aortic regurgitation seen, affecting mitral valve without clear preclosure. Likely at least moderate and possibly severe  regurgitation.  Pulmonic Valve: The pulmonic valve was normal in structure. Pulmonic valve regurgitation is not visualized by color flow Doppler.  Aorta: The aortic arch is normal in size and structure. The aorta is abnormal in size and structure. The descending aorta was not well visualized. There is mild dilatation of the aortic root and of the ascending aorta measuring 40 mm.  Pulmonary Artery: The pulmonary artery is of normal size and origin.  Venous: The inferior vena cava is normal in size with greater than 50% respiratory variability.    +--------------+--------++  LEFT VENTRICLE            +----------------+----------++ +--------------+--------++  Diastology                     PLAX 2D                   +----------------+----------++ +--------------+--------++  LV e' lateral:   7.71 cm/s     LVIDd:         5.70 cm    +----------------+----------++ +--------------+--------++  LV E/e' lateral: 9.6           LVIDs:  4.20 cm     +----------------+----------++ +--------------+--------++  LV e' medial:    10.60 cm/s    LV PW:         1.20 cm    +----------------+----------++ +--------------+--------++  LV E/e' medial:  7.0           LV IVS:        1.20 cm    +----------------+----------++ +--------------+--------++  LVOT diam:     2.20 cm    +--------------+--------++  LV SV:         81 ml      +--------------+--------++  LV SV Index:   49.24      +--------------+--------++  LVOT Area:     3.80 cm   +--------------+--------++                            +--------------+--------++  +---------------+---------++  RIGHT VENTRICLE             +---------------+---------++  RV S prime:     8.85 cm/s   +---------------+---------++  TAPSE (M-mode): 2.0 cm      +---------------+---------++  +---------------+-------++-----------++  LEFT ATRIUM              Index         +---------------+-------++-----------++  LA diam:        3.70 cm  2.24 cm/m    +---------------+-------++-----------++  LA Vol (A2C):   60.4 ml  36.51 ml/m   +---------------+-------++-----------++  LA Vol (A4C):   34.9 ml  21.10 ml/m   +---------------+-------++-----------++  LA Biplane Vol: 48.0 ml  29.02 ml/m   +---------------+-------++-----------++ +------------+---------++-----------++  RIGHT ATRIUM            Index         +------------+---------++-----------++  RA Area:     17.80 cm                +------------+---------++-----------++  RA Volume:   46.50 ml   28.11 ml/m   +------------+---------++-----------++  +------------------+------------++  AORTIC VALVE                      +------------------+------------++  AV Area (Vmax):    3.35 cm       +------------------+------------++  AV Area (Vmean):   3.28 cm       +------------------+------------++  AV Area (VTI):     3.24 cm       +------------------+------------++  AV Vmax:           217.50 cm/s    +------------------+------------++  AV Vmean:          145.500  cm/s   +------------------+------------++  AV VTI:            0.437 m        +------------------+------------++  AV Peak Grad:      18.9 mmHg      +------------------+------------++  AV Mean Grad:      9.5 mmHg       +------------------+------------++  LVOT Vmax:         191.50 cm/s    +------------------+------------++  LVOT Vmean:        125.500 cm/s   +------------------+------------++  LVOT VTI:          0.373 m        +------------------+------------++  LVOT/AV VTI ratio: 0.85           +------------------+------------++  AR PHT:  510 msec       +------------------+------------++   +-------------+-------++  AORTA                   +-------------+-------++  Ao Root diam: 3.80 cm   +-------------+-------++  +--------------+----------++  MITRAL VALVE                +--------------+-------+ +--------------+----------++  SHUNTS                   MV Area (PHT): 4.06 cm     +--------------+-------+ +--------------+----------++  Systemic VTI:  0.37 m    MV PHT:        54.23 msec   +--------------+-------+ +--------------+----------++  Systemic Diam: 2.20 cm   MV Decel Time: 187 msec     +--------------+-------+ +--------------+----------++ +--------------+----------++  MV E velocity: 74.20 cm/s   +--------------+----------++  MV A velocity: 62.40 cm/s   +--------------+----------++  MV E/A ratio:  1.19         +--------------+----------++    Jodelle Red MD Electronically signed by Jodelle Red MD Signature Date/Time: 05/17/2019/4:17:08 PM     RIGHT/LEFT HEART CATH AND CORONARY ANGIOGRAPHY  Conclusion  1) Severe multivessel CAD with severe stenosis of the LAD, diagonal, OM, and distal circumflex (dominant circumflex) 2) Moderately severe AI (3-4+) 3) Normal right heart hemodynamics with low right sided filling pressures  Recommend: Surgical consultation for consideration of AVR/CABG  Indications  Nonrheumatic aortic valve  insufficiency [I35.1 (ICD-10-CM)]  Procedural Details  Technical Details INDICATION: 68 yo male transferred to [facility] for further cardiac evaluation of aortic valve insufficiency and heart failure with LV dysfunction. Referred for R/L heart catheterization.  PROCEDURAL DETAILS: There was an indwelling IV in a right antecubital vein. Using normal sterile technique, the IV was changed out for a 5 Fr brachial sheath over a 0.018 inch wire. The right wrist was then prepped, draped, and anesthetized with 1% lidocaine. Using the modified Seldinger technique a 5/6 French Slender sheath was placed in the right radial artery. Intra-arterial verapamil was administered through the radial artery sheath. IV heparin was administered after a JR4 catheter was advanced into the central aorta. A Swan-Ganz catheter was used for the right heart catheterization. Standard protocol was followed for recording of right heart pressures and sampling of oxygen saturations. Fick cardiac output was calculated. Standard Judkins catheters were used for selective coronary angiography. LV pressure is recorded and an aortic valve pullback is performed. Aortic root angiography is performed. There were no immediate procedural complications. The patient was transferred to the post catheterization recovery area for further monitoring.    Estimated blood loss <50 mL.   During this procedure medications were administered to achieve and maintain moderate conscious sedation while the patient's heart rate, blood pressure, and oxygen saturation were continuously monitored and I was present face-to-face 100% of this time.  Medications (Filter: Administrations occurring from 05/17/19 1422 to 05/17/19 1535) (important)  Continuous medications are totaled by the amount administered until 05/17/19 1535.  Medication Rate/Dose/Volume Action  Date Time   midazolam (VERSED) injection (mg) 1 mg Given 05/17/19 1451   Total dose as of 05/17/19 1535         1 mg        fentaNYL (SUBLIMAZE) injection (mcg) 25 mcg Given 05/17/19 1452   Total dose as of 05/17/19 1535        25 mcg        lidocaine (PF) (XYLOCAINE) 1 % injection (mL) 2 mL Given 05/17/19 1455  Total dose as of 05/17/19 1535 2 mL Given 1458   4 mL        Radial Cocktail/Verapamil only (mL) 10 mL Given 05/17/19 1458   Total dose as of 05/17/19 1535        10 mL        Heparin (Porcine) in NaCl 1000-0.9 UT/500ML-% SOLN (mL) 500 mL Given 05/17/19 1458   Total dose as of 05/17/19 1535 500 mL Given 1458   1,000 mL        0.9 % sodium chloride infusion (mL) 250 mL - 999 mL/hr New Bag/Given 05/17/19 1508   Total dose as of 05/17/19 1535        Cannot be calculated        heparin injection (Units) 3,000 Units Given 05/17/19 1510   Total dose as of 05/17/19 1535        3,000 Units        iohexol (OMNIPAQUE) 350 MG/ML injection (mL) 80 mL Given 05/17/19 1534   Total dose as of 05/17/19 1535        80 mL        Sedation Time  Sedation Time Physician-1: 31 minutes 20 seconds  Coronary Findings  Diagnostic Dominance: Left Left Main  The vessel exhibits minimal luminal irregularities. Patent vessel without significant stenosis  Left Anterior Descending  Prox LAD to Mid LAD lesion 80% stenosed  Prox LAD to Mid LAD lesion is 80% stenosed. The lesion is eccentric. there is severe proximal LAD stenosis involving the first diagonal with disease extending beyond the diagonal branch with a very tight ostial diagonal stenosis  First Diagonal Branch  1st Diag lesion 80% stenosed  1st Diag lesion is 80% stenosed.  Ramus Intermedius  Vessel is small.  Ramus lesion 80% stenosed  Ramus lesion is 80% stenosed. Tiny vessel not amenable to revascularization  Left Circumflex  Prox Cx to Mid Cx lesion 40% stenosed  Prox Cx to Mid Cx lesion is 40% stenosed.  Dist Cx lesion 70% stenosed with side branch in LPAV 70% stenosed  Dist Cx lesion is 70% stenosed with 70% stenosed side branch in  LPAV. diffuse disease is present in the distal AV circumflex at the origin of the PLA and PDA branches (dominant LCx)  First Obtuse Marginal Branch  1st Mrg-1 lesion 75% stenosed  1st Mrg-1 lesion is 75% stenosed. moderate caliber vessel, likely suitable for grafting  1st Mrg-2 lesion 80% stenosed  1st Mrg-2 lesion is 80% stenosed.  Left Posterior Descending Artery  LPDA lesion 75% stenosed  LPDA lesion is 75% stenosed.  Right Coronary Artery  Vessel is small. No significant disease. Small vessel.  Intervention  No interventions have been documented. Left Heart  Aortic Valve There is severe (4+) aortic regurgitation.  Coronary Diagrams  Diagnostic Dominance: Left  Intervention  Implants   No implant documentation for this case.  Syngo Images  Show images for CARDIAC CATHETERIZATION  Images on Long Term Storage  Show images for Dael, [place] to Procedure Log  Procedure Log    Hemo Data   Most Recent Value  Fick Cardiac Output 3.58 L/min  Fick Cardiac Output Index 2.16 (L/min)/BSA  RA A Wave 1 mmHg  RA V Wave 0 mmHg  RA Mean 0 mmHg  RV Systolic Pressure 19 mmHg  RV Diastolic Pressure -2 mmHg  RV EDP -1 mmHg  PA Systolic Pressure 15 mmHg  PA Diastolic Pressure 8 mmHg  PA Mean 10 mmHg  PW A Wave 6 mmHg  PW V Wave 7 mmHg  PW Mean 4 mmHg  AO Systolic Pressure 90 mmHg  AO Diastolic Pressure 37 mmHg  AO Mean 56 mmHg  LV Systolic Pressure 113 mmHg  LV Diastolic Pressure 2 mmHg  LV EDP 6 mmHg  AOp Systolic Pressure 103 mmHg  AOp Diastolic Pressure 36 mmHg  AOp Mean Pressure 58 mmHg  LVp Systolic Pressure 108 mmHg  LVp Diastolic Pressure 3 mmHg  LVp EDP Pressure 6 mmHg  QP/QS 1  TPVR Index 4.64 HRUI  TSVR Index 25.96 HRUI  TPVR/TSVR Ratio 0.18      Impression:  Patient has severe multivessel coronary artery disease and severe aortic insufficiency.  He presents with a long history of progressive symptoms of substernal chest pain that have  progressed recently, consistent with unstable angina pectoris.  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  I have personally reviewed the patient's recent transthoracic echocardiogram and diagnostic cardiac catheterization.  Patient has global left ventricular chamber enlargement and moderate to severe left ventricular systolic dysfunction.  There appears to be isolated prolapse of the right coronary leaflet of the aortic valve with severe aortic insufficiency.  The jet of aortic insufficiency is eccentric.  The ascending aorta is somewhat dilated.  Diagnostic cardiac catheterization revealed severe multivessel coronary artery disease including long segment severe stenosis of the proximal left anterior descending coronary artery, high-grade stenosis of a large diagonal branch, and severe stenosis of the distal left circumflex coronary artery.  The patient has left dominant coronary circulation.  Right heart pressures and cardiac output remain normal.  I agree the patient would best be treated with aortic valve repair or replacement and coronary artery bypass grafting.  Risks associated with surgery will be slightly elevated because of the patient's left ventricular systolic dysfunction and comorbid medical conditions including COPD with ongoing tobacco abuse and mild to moderate chronic kidney disease.    Plan:  I have reviewed the indications, risks, and potential benefits of aortic valve repair or replacement and coronary artery bypass grafting with the patient at the bedside.  Alternative treatment strategies for management of both aortic insufficiency and coronary artery disease have been discussed, including the relative risks, benefits and long term prognosis associated with medical therapy alone, percutaneous coronary intervention without addressing the patient's valvular  disease, and surgical treatment for both.  The natural history of aortic insufficiency was reviewed, as was long term prognosis with medical therapy alone.  Surgical options for management of severe aortic insufficiency were discussed including a role for possible valve repair versus valve replacement.  If the patient's valve is to be replaced, discussion was held comparing the relative risks of mechanical valve replacement with need for lifelong anticoagulation versus use of a bioprosthetic tissue valve and the associated potential for late structural valve deterioration and failure.  This discussion was placed in the context of the patient's particular circumstances, and as a result the patient specifically requests that their valve be replaced using a bioprosthetic valve.  All of his questions have been addressed.  As a neck step the patient will undergo pulmonary function testing.  The need for smoking cessation has been emphasized.  The patient will additionally undergo cardiac gated CT angiogram of the heart to further evaluate the functional anatomy of the patient's aortic valve disease as well as the dilated ascending thoracic aorta.  We tentatively plan  to proceed with surgery on Tuesday, May 21, 2019.    I spent in excess of 120 minutes during the conduct of this hospital consultation and >50% of this time involved direct face-to-face encounter for counseling and/or coordination of the patient's care.    Salvatore Decent. Cornelius Moras, MD 05/18/2019 11:49 AM

## 2019-05-19 ENCOUNTER — Other Ambulatory Visit (HOSPITAL_COMMUNITY): Payer: Medicare Other

## 2019-05-19 ENCOUNTER — Inpatient Hospital Stay (HOSPITAL_COMMUNITY): Payer: Medicare Other

## 2019-05-19 ENCOUNTER — Encounter (HOSPITAL_COMMUNITY): Payer: Self-pay | Admitting: Radiology

## 2019-05-19 DIAGNOSIS — I5022 Chronic systolic (congestive) heart failure: Secondary | ICD-10-CM | POA: Diagnosis present

## 2019-05-19 DIAGNOSIS — Z01810 Encounter for preprocedural cardiovascular examination: Secondary | ICD-10-CM

## 2019-05-19 DIAGNOSIS — I2510 Atherosclerotic heart disease of native coronary artery without angina pectoris: Secondary | ICD-10-CM

## 2019-05-19 DIAGNOSIS — E785 Hyperlipidemia, unspecified: Secondary | ICD-10-CM | POA: Diagnosis present

## 2019-05-19 DIAGNOSIS — I25119 Atherosclerotic heart disease of native coronary artery with unspecified angina pectoris: Secondary | ICD-10-CM

## 2019-05-19 DIAGNOSIS — I10 Essential (primary) hypertension: Secondary | ICD-10-CM | POA: Diagnosis present

## 2019-05-19 DIAGNOSIS — I351 Nonrheumatic aortic (valve) insufficiency: Secondary | ICD-10-CM | POA: Diagnosis present

## 2019-05-19 LAB — IRON AND TIBC
Iron: 37 ug/dL — ABNORMAL LOW (ref 45–182)
Saturation Ratios: 13 % — ABNORMAL LOW (ref 17.9–39.5)
TIBC: 280 ug/dL (ref 250–450)
UIBC: 243 ug/dL

## 2019-05-19 LAB — BASIC METABOLIC PANEL
Anion gap: 10 (ref 5–15)
BUN: 43 mg/dL — ABNORMAL HIGH (ref 8–23)
CO2: 25 mmol/L (ref 22–32)
Calcium: 9.5 mg/dL (ref 8.9–10.3)
Chloride: 105 mmol/L (ref 98–111)
Creatinine, Ser: 1.2 mg/dL (ref 0.61–1.24)
GFR calc Af Amer: >60 mL/min (ref >60)
GFR calc non Af Amer: >60 mL/min (ref >60)
Glucose, Bld: 83 mg/dL (ref 70–99)
Potassium: 3.3 mmol/L — ABNORMAL LOW (ref 3.5–5.1)
Sodium: 140 mmol/L (ref 135–145)

## 2019-05-19 LAB — CBC
HCT: 31.1 % — ABNORMAL LOW (ref 39.0–52.0)
Hemoglobin: 10.3 g/dL — ABNORMAL LOW (ref 13.0–17.0)
MCH: 31.1 pg (ref 26.0–34.0)
MCHC: 33.1 g/dL (ref 30.0–36.0)
MCV: 94 fL (ref 80.0–100.0)
Platelets: 181 10*3/uL (ref 150–400)
RBC: 3.31 MIL/uL — ABNORMAL LOW (ref 4.22–5.81)
RDW: 16 % — ABNORMAL HIGH (ref 11.5–15.5)
WBC: 7 10*3/uL (ref 4.0–10.5)
nRBC: 0 % (ref 0.0–0.2)

## 2019-05-19 LAB — FERRITIN: Ferritin: 178 ng/mL (ref 24–336)

## 2019-05-19 MED ORDER — SODIUM CHLORIDE 0.9% FLUSH
10.0000 mL | INTRAVENOUS | Status: DC | PRN
  Administered 2019-05-19: 10 mL
  Filled 2019-05-19: qty 40

## 2019-05-19 MED ORDER — POTASSIUM CHLORIDE CRYS ER 20 MEQ PO TBCR
20.0000 meq | EXTENDED_RELEASE_TABLET | Freq: Every day | ORAL | Status: DC
  Administered 2019-05-19 – 2019-05-20 (×2): 20 meq via ORAL
  Filled 2019-05-19 (×2): qty 1

## 2019-05-19 MED ORDER — IOHEXOL 350 MG/ML SOLN
95.0000 mL | Freq: Once | INTRAVENOUS | Status: AC | PRN
  Administered 2019-05-19: 95 mL via INTRAVENOUS

## 2019-05-19 NOTE — Progress Notes (Addendum)
[facility] PROGRESS NOTE  Dennis Gomez NIO:[id number] DOB: December 30, 1950 DOA: 05/16/2019 PCP: Serita Grammes, MD    Dennis Gomez  is a 68 y.o. male,   w hypothyroidism, Copd, CHF (EF 35-40%), apparently admitted for chest pain on 05/14/2019 at [facility]  Chest pain w/up  included echocardiogram that revealed new systolic with combined diastolic congestive heart failure with an EF of 35-40% with moderate to severe aortic regurgitation seen.  Cardiology consulted and recommended transfer to [facility] for cardiac catheterization and possible surgical consult for  aortic  regurgitation. CAth done with severe 3 vessel disease. PFTs pending and then tentative plan for surgery on Tuesday.  Assessment/Plan:  CAD -cath shows severe multivessel disease and in need of CABG and valve repair.   -ASA/statin -no BB as HR low -CT surgery consult appreciated- PFTs, gated CT scan and then tentatively planned for surgery on Tuesday  Aortic regurgitation/Mitral regurgitation  -evaluated by cards: repeating echo, right and left heart cath today.  -Given need for CABG will need AVR- planned for Tuesday  Cardiomyopathy  -EF 45-50 now -Cont lisinopril 10mg  po qday -Cont hydralazine 10mg  po tid,   Acute kidney injury vs CKD -baseline unknown -trending down  Hypothyroidism Cont Levothyroxine at 150 micrograms po qday   Hyperlipidemia (LDL =174) Cont Lipitor 40mg  po qhs  Copd stable at baseline. Not on home oxygen -duoneb prn -Start Anoro 1puff qday -Albuterol neb q6h prn   Anemia- unknown (perhaps CKD) -iron only mildly low  Tobacco abuse Nicotine patch PRN  Code Status: full Family Communication: patient-- called son Dennis Gomez 8/1 Disposition Plan: needs CABG/AVR   Consultants:  CVTS  cards    HPI/Subjective: No overnight events  Objective: Vitals:   05/18/19 2059 05/19/19 0553  BP: (!) 152/45 (!) 131/41  Pulse: (!) 55 (!) 54  Resp:  18  Temp: 98.4 F  (36.9 C) (!) 97.2 F (36.2 C)  SpO2: 93% 94%    Intake/Output Summary (Last 24 hours) at 05/19/2019 0808 Last data filed at 05/19/2019 0557 Gross per 24 hour  Intake 723 ml  Output 950 ml  Net -227 ml   Filed Weights   05/17/19 0500 05/18/19 0608 05/19/19 0553  Weight: 59.6 kg 56.9 kg 60.6 kg    Exam:   General:  In bed, NAD   Data Reviewed: Basic Metabolic Panel: Recent Labs  Lab 05/17/19 0436 05/17/19 1508 05/18/19 0328 05/19/19 0506  NA 139 141  143 140 140  K 3.7 3.6  3.5 3.7 3.3*  CL 100  --  102 105  CO2 27  --  25 25  GLUCOSE 83  --  82 83  BUN 36*  --  42* 43*  CREATININE 1.46*  --  1.37* 1.20  CALCIUM 10.1  --  9.5 9.5   Liver Function Tests: Recent Labs  Lab 05/17/19 0436 05/18/19 0328  AST 27 24  ALT 14 11  ALKPHOS 65 59  BILITOT 0.9 1.0  PROT 8.0 7.2  ALBUMIN 4.2 3.8   No results for input(s): LIPASE, AMYLASE in the last 168 hours. No results for input(s): AMMONIA in the last 168 hours. CBC: Recent Labs  Lab 05/17/19 0436 05/17/19 1508 05/18/19 0328 05/19/19 0506  WBC 6.7  --  8.2 7.0  HGB 12.6* 10.9*  10.5* 10.9* 10.3*  HCT 38.2* 32.0*  31.0* 33.1* 31.1*  MCV 94.1  --  95.1 94.0  PLT 231  --  185 181   Cardiac Enzymes: No results for input(s): CKTOTAL,  CKMB, CKMBINDEX, TROPONINI in the last 168 hours. BNP (last 3 results) No results for input(s): BNP in the last 8760 hours.  ProBNP (last 3 results) No results for input(s): PROBNP in the last 8760 hours.  CBG: No results for input(s): GLUCAP in the last 168 hours.  Recent Results (from the past 240 hour(s))  SARS Coronavirus 2 (CEPHEID - Performed in [facility] hospital lab), Hosp Order     Status: None   Collection Time: 05/16/19 11:41 PM   Specimen: Nasopharyngeal Swab  Result Value Ref Range Status   SARS Coronavirus 2 NEGATIVE NEGATIVE Final    Comment: (NOTE) If result is NEGATIVE SARS-CoV-2 target nucleic acids are NOT DETECTED. The SARS-CoV-2 RNA is generally  detectable in upper and lower  respiratory specimens during the acute phase of infection. The lowest  concentration of SARS-CoV-2 viral copies this assay can detect is 250  copies / mL. A negative result does not preclude SARS-CoV-2 infection  and should not be used as the sole basis for treatment or other  patient management decisions.  A negative result may occur with  improper specimen collection / handling, submission of specimen other  than nasopharyngeal swab, presence of viral mutation(s) within the  areas targeted by this assay, and inadequate number of viral copies  (<250 copies / mL). A negative result must be combined with clinical  observations, patient history, and epidemiological information. If result is POSITIVE SARS-CoV-2 target nucleic acids are DETECTED. The SARS-CoV-2 RNA is generally detectable in upper and lower  respiratory specimens dur ing the acute phase of infection.  Positive  results are indicative of active infection with SARS-CoV-2.  Clinical  correlation with patient history and other diagnostic information is  necessary to determine patient infection status.  Positive results do  not rule out bacterial infection or co-infection with other viruses. If result is PRESUMPTIVE POSTIVE SARS-CoV-2 nucleic acids MAY BE PRESENT.   A presumptive positive result was obtained on the submitted specimen  and confirmed on repeat testing.  While 2019 novel coronavirus  (SARS-CoV-2) nucleic acids may be present in the submitted sample  additional confirmatory testing may be necessary for epidemiological  and / or clinical management purposes  to differentiate between  SARS-CoV-2 and other Sarbecovirus currently known to infect humans.  If clinically indicated additional testing with an alternate test  methodology [phone] is advised. The SARS-CoV-2 RNA is generally  detectable in upper and lower respiratory sp ecimens during the acute  phase of infection. The  expected result is Negative. Fact Sheet for Patients:  BoilerBrush.com.cyhttps://www.fda.gov/media/136312/download Fact Sheet for Healthcare Providers: https://pope.com/https://www.fda.gov/media/136313/download This test is not yet approved or cleared by the [place] FDA and has been authorized for detection and/or diagnosis of SARS-CoV-2 by FDA under an Emergency Use Authorization (EUA).  This EUA will remain in effect (meaning this test can be used) for the duration of the COVID-19 declaration under Section 564(b)(1) of the Act, 21 U.S.C. section 360bbb-3(b)(1), unless the authorization is terminated or revoked sooner. Performed at [facility], [address] [place], [place] [address]      Studies: Dg Chest 2 View  Result Date: 05/18/2019 CLINICAL DATA:  68 year old male with recent chest pain. Productive cough. EXAM: CHEST - 2 VIEW COMPARISON:  05/14/2019 radiographs and CTA. FINDINGS: Stable lung volumes and mediastinal contours. Visualized tracheal air column is within normal limits. No pneumothorax, pulmonary edema or definite pleural effusion. No confluent pulmonary opacity. Osteopenia. No acute osseous abnormality identified. Negative visible bowel  gas pattern. IMPRESSION: No acute cardiopulmonary abnormality. Electronically Signed   By: Odessa Fleming M.D.   On: 05/18/2019 07:49    Scheduled Meds: . aspirin EC  81 mg Oral Daily  . atorvastatin  40 mg Oral q1800  . enoxaparin (LOVENOX) injection  40 mg Subcutaneous Q24H  . furosemide  40 mg Oral Daily  . hydrALAZINE  10 mg Oral TID  . levothyroxine  150 mcg Oral Q0600  . lisinopril  10 mg Oral Daily  . pantoprazole  40 mg Oral Daily  . sodium chloride flush  3 mL Intravenous Q12H  . sodium chloride flush  3 mL Intravenous Q12H  . sodium chloride flush  3 mL Intravenous Q12H  . umeclidinium-vilanterol  1 puff Inhalation Daily   Continuous Infusions: . sodium chloride    . sodium chloride      Principal Problem:   Aortic regurgitation Active  Problems:   Hypothyroidism   COPD (chronic obstructive pulmonary disease) (HCC)   Chest pain   Acute kidney injury (HCC)   Chronic kidney disease (CKD), stage III (moderate) (HCC)   Coronary artery disease involving native coronary artery of native heart with angina pectoris (HCC)   Tobacco abuse    Time spent: 15 minutes    Joseph Art DO [facility]  If 7PM-7AM, please contact night-coverage at [email or web address], password [facility] 05/19/2019, 8:08 AM  LOS: 3 days

## 2019-05-19 NOTE — Progress Notes (Signed)
Pre-CABG testing has been completed. Preliminary results can be found in CV Proc through chart review.   05/19/19 10:31 AM Dennis Gomez RVT

## 2019-05-19 NOTE — Plan of Care (Signed)
  Problem: Nutrition: Goal: Adequate nutrition will be maintained Outcome: Progressing   Problem: Coping: Goal: Level of anxiety will decrease Outcome: Progressing   Problem: Pain Managment: Goal: General experience of comfort will improve Outcome: Progressing   Problem: Safety: Goal: Ability to remain free from injury will improve Outcome: Progressing   

## 2019-05-19 NOTE — Progress Notes (Signed)
Progress Note  Patient Name: Dennis Gomez Date of Encounter: 05/19/2019  Primary Cardiologist: Bettina Gavia (new)  Subjective   Denies chest pain.  Dyspnea is stable.  Carotid Doppler is currently being performed at the time of my evaluation.  Inpatient Medications    Scheduled Meds: . aspirin EC  81 mg Oral Daily  . atorvastatin  40 mg Oral q1800  . enoxaparin (LOVENOX) injection  40 mg Subcutaneous Q24H  . furosemide  40 mg Oral Daily  . hydrALAZINE  10 mg Oral TID  . levothyroxine  150 mcg Oral Q0600  . lisinopril  10 mg Oral Daily  . pantoprazole  40 mg Oral Daily  . potassium chloride  20 mEq Oral Daily  . sodium chloride flush  3 mL Intravenous Q12H  . sodium chloride flush  3 mL Intravenous Q12H  . sodium chloride flush  3 mL Intravenous Q12H  . umeclidinium-vilanterol  1 puff Inhalation Daily   Continuous Infusions: . sodium chloride    . sodium chloride     PRN Meds: sodium chloride, sodium chloride, acetaminophen **OR** acetaminophen, albuterol, nicotine, ondansetron (ZOFRAN) IV, sodium chloride flush, sodium chloride flush   Vital Signs    Vitals:   05/18/19 1729 05/18/19 2059 05/19/19 0553 05/19/19 0824  BP: (!) 143/44 (!) 152/45 (!) 131/41   Pulse:  (!) 55 (!) 54   Resp:   18   Temp:  98.4 F (36.9 C) (!) 97.2 F (36.2 C)   TempSrc:  Oral Oral   SpO2:  93% 94% 100%  Weight:   60.6 kg   Height:        Intake/Output Summary (Last 24 hours) at 05/19/2019 0908 Last data filed at 05/19/2019 0557 Gross per 24 hour  Intake 483 ml  Output 950 ml  Net -467 ml   Filed Weights   05/17/19 0500 05/18/19 0608 05/19/19 0553  Weight: 59.6 kg 56.9 kg 60.6 kg    Telemetry    Sinus rhythm/sinus bradycardia- Personally Reviewed    Physical Exam   GEN: No acute distress.   Neck: No JVD Cardiac:RRR, 2/6 systolic over RUSB, 3/4 holodiastolic murmur along RSB,no rubs, or gallops.  Respiratory:Diffusely diminished breath sounds, no crackles or wheezes. GI:  Soft, nontender, non-distended  MS: No edema; No deformity. Neuro:  Nonfocal  Psych: Normal affect   Labs    Chemistry Recent Labs  Lab 05/17/19 0436 05/17/19 1508 05/18/19 0328 05/19/19 0506  NA 139 141  143 140 140  K 3.7 3.6  3.5 3.7 3.3*  CL 100  --  102 105  CO2 27  --  25 25  GLUCOSE 83  --  82 83  BUN 36*  --  42* 43*  CREATININE 1.46*  --  1.37* 1.20  CALCIUM 10.1  --  9.5 9.5  PROT 8.0  --  7.2  --   ALBUMIN 4.2  --  3.8  --   AST 27  --  24  --   ALT 14  --  11  --   ALKPHOS 65  --  59  --   BILITOT 0.9  --  1.0  --   GFRNONAA 50*  --  54* >60  GFRAA 58*  --  >60 >60  ANIONGAP 12  --  13 10     Hematology Recent Labs  Lab 05/17/19 0436 05/17/19 1508 05/18/19 0328 05/19/19 0506  WBC 6.7  --  8.2 7.0  RBC 4.06*  --  3.48* 3.31*  HGB 12.6* 10.9*  10.5* 10.9* 10.3*  HCT 38.2* 32.0*  31.0* 33.1* 31.1*  MCV 94.1  --  95.1 94.0  MCH 31.0  --  31.3 31.1  MCHC 33.0  --  32.9 33.1  RDW 16.2*  --  16.2* 16.0*  PLT 231  --  185 181    Cardiac EnzymesNo results for input(s): TROPONINI in the last 168 hours. No results for input(s): TROPIPOC in the last 168 hours.   BNPNo results for input(s): BNP, PROBNP in the last 168 hours.   DDimer No results for input(s): DDIMER in the last 168 hours.   Radiology    Dg Chest 2 View  Result Date: 05/18/2019 CLINICAL DATA:  68 year old male with recent chest pain. Productive cough. EXAM: CHEST - 2 VIEW COMPARISON:  05/14/2019 radiographs and CTA. FINDINGS: Stable lung volumes and mediastinal contours. Visualized tracheal air column is within normal limits. No pneumothorax, pulmonary edema or definite pleural effusion. No confluent pulmonary opacity. Osteopenia. No acute osseous abnormality identified. Negative visible bowel gas pattern. IMPRESSION: No acute cardiopulmonary abnormality. Electronically Signed   By: Odessa FlemingH  Hall M.D.   On: 05/18/2019 07:49    Cardiac Studies   Echo 05/17/19:  IMPRESSIONS   1. The  left ventricle has mildly reduced systolic function, with an ejection fraction of 45-50%. The cavity size was mild to moderately dilated. There is mild concentric left ventricular hypertrophy. Left ventricular diastolic Doppler parameters are  indeterminate. Left ventricular diffuse hypokinesis. 2. Diffuse hypokinesis in basal to mid segments, slightly improved at apex. In multiple coronary territories. 3. The right ventricle has normal systolic function. The cavity was normal. There is no increase in right ventricular wall thickness. Right ventricular systolic pressure could not be assessed. 4. Left atrial size was mildly dilated. 5. Right atrial size was mildly dilated. 6. Trivial pericardial effusion is present. 7. The mitral valve is abnormal. 8. The aortic valve is abnormal. Aortic valve regurgitation is moderate to severe by color flow Doppler. 9. Eccentric aortic regurgitation seen, affecting mitral valve without clear preclosure. Likely at least moderate and possibly severe regurgitation. 10. The aorta is abnormal in size and structure. 11. The aortic arch is normal in size and structure. 12. There is mild dilatation of the aortic root and of the ascending aorta measuring 40 mm.   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 Patent vessel without significant stenosis  Left Anterior Descending  Prox LAD to Mid LAD lesion 80% stenosed  Prox LAD to Mid LAD lesion is 80% stenosed. The lesion is eccentric. there is severe proximal LAD stenosis involving the first diagonal with disease extending beyond the diagonal branch with a very tight ostial  diagonal stenosis  First Diagonal Branch  1st Diag lesion 80% stenosed  1st Diag lesion is 80% stenosed.  Ramus Intermedius  Vessel is small.  Ramus lesion 80% stenosed  Ramus lesion is 80% stenosed. Tiny vessel not amenable to revascularization  Left Circumflex  Prox Cx to Mid Cx lesion 40% stenosed  Prox Cx to Mid Cx lesion is 40% stenosed.  Dist Cx lesion 70% stenosed with side branch in LPAV 70% stenosed  Dist Cx lesion is 70% stenosed with 70% stenosed side  branch in LPAV. diffuse disease is present in the distal AV circumflex at the origin of the PLA and PDA branches (dominant LCx)  First Obtuse Marginal Branch  1st Mrg-1 lesion 75% stenosed  1st Mrg-1 lesion is 75% stenosed. moderate caliber vessel, likely suitable for grafting  1st Mrg-2 lesion 80% stenosed  1st Mrg-2 lesion is 80% stenosed.  Left Posterior Descending Artery  LPDA lesion 75% stenosed  LPDA lesion is 75% stenosed.  Right Coronary Artery  Vessel is small. No significant disease. Small vessel.        Patient Profile     68 y.o. male with a past medical history significant for COPD, tobacco abuse and hypothyroidism who presented to [facility]2019 for complaints of off-and-on chest pain. Echo with EF 35-40% and moderate to severe AR. Troponin 0.24 BNP 17900.  Found to have severe multivessel CAD, moderate to severe AR, and EF 45-50%.  Assessment & Plan    1. 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 Currently denies anginal symptoms. Continue ASA and statin. Beta blocker held due to COPD and resting bradycardia (HR mid 50's at present).  Surgery planned for 05/21/2019.  2. Cardiomyopathy/chronic systolic HF: EF 45-50%. Euvolemic. On Lasix 40 mg and lisinopril. No beta blockers due to COPD and resting bradycardia (HR mid 50's at present).  Hypokalemic so I will start daily potassium chloride.  3. HTN: BP normal. No changes.  4. Aortic regurgitation: Moderate to severe. Given need  for CABG will need AVR.  Surgery planned for 05/21/2019.  5. HLD: On statin. LDL 174.   For questions or updates, please contact [facility] Please consult [email or web address] for contact info under Cardiology/STEMI.      Signed, Prentice Docker, MD  05/19/2019, 9:08 AM

## 2019-05-19 NOTE — Progress Notes (Signed)
[facility] BRIEF PROGRESS NOTE   Clinically stable.  No chest pain. ECHO results reviewed. Await cardiac-gated CTA  Rexene Alberts, MD 05/19/2019 12:24 PM

## 2019-05-20 ENCOUNTER — Encounter (HOSPITAL_COMMUNITY): Payer: Self-pay | Admitting: Cardiovascular Disease

## 2019-05-20 ENCOUNTER — Inpatient Hospital Stay (HOSPITAL_COMMUNITY): Payer: Medicare Other

## 2019-05-20 DIAGNOSIS — I200 Unstable angina: Secondary | ICD-10-CM

## 2019-05-20 LAB — URINALYSIS, ROUTINE W REFLEX MICROSCOPIC
Bilirubin Urine: NEGATIVE
Glucose, UA: NEGATIVE mg/dL
Ketones, ur: NEGATIVE mg/dL
Nitrite: NEGATIVE
Protein, ur: NEGATIVE mg/dL
Specific Gravity, Urine: 1.021 (ref 1.005–1.030)
WBC, UA: >50 WBC/hpf — ABNORMAL HIGH (ref 0–5)
pH: 6 (ref 5.0–8.0)

## 2019-05-20 LAB — PULMONARY FUNCTION TEST
DL/VA % pred: 46 %
DL/VA: 1.97 ml/min/mmHg/L
DLCO unc % pred: 34 %
DLCO unc: 7.8 ml/min/mmHg
FEF 25-75 Post: 0.61 L/sec
FEF 25-75 Pre: 0.64 L/sec
FEF2575-%Change-Post: -4 %
FEF2575-%Pred-Post: 26 %
FEF2575-%Pred-Pre: 28 %
FEV1-%Change-Post: -4 %
FEV1-%Pred-Post: 52 %
FEV1-%Pred-Pre: 55 %
FEV1-Post: 1.49 L
FEV1-Pre: 1.56 L
FEV1FVC-%Change-Post: 1 %
FEV1FVC-%Pred-Pre: 75 %
FEV6-%Change-Post: -4 %
FEV6-%Pred-Post: 68 %
FEV6-%Pred-Pre: 71 %
FEV6-Post: 2.46 L
FEV6-Pre: 2.56 L
FEV6FVC-%Change-Post: 1 %
FEV6FVC-%Pred-Post: 100 %
FEV6FVC-%Pred-Pre: 99 %
FVC-%Change-Post: -6 %
FVC-%Pred-Post: 68 %
FVC-%Pred-Pre: 72 %
FVC-Post: 2.59 L
FVC-Pre: 2.76 L
Post FEV1/FVC ratio: 57 %
Post FEV6/FVC ratio: 95 %
Pre FEV1/FVC ratio: 57 %
Pre FEV6/FVC Ratio: 94 %
RV % pred: 206 %
RV: 4.27 L
TLC % pred: 116 %
TLC: 7.02 L

## 2019-05-20 LAB — FOLATE RBC
Folate, Hemolysate: 342 ng/mL
Folate, RBC: 937 ng/mL (ref >498)
Hematocrit: 36.5 % — ABNORMAL LOW (ref 37.5–51.0)

## 2019-05-20 LAB — SURGICAL PCR SCREEN
MRSA, PCR: NEGATIVE
Staphylococcus aureus: NEGATIVE

## 2019-05-20 LAB — ABO/RH: ABO/RH(D): A POS

## 2019-05-20 MED ORDER — METOPROLOL TARTRATE 12.5 MG HALF TABLET: 12.5000 mg | ORAL_TABLET | Freq: Once | ORAL | Status: DC

## 2019-05-20 MED ORDER — DEXMEDETOMIDINE HCL IN NACL 400 MCG/100ML IV SOLN
0.1000 ug/kg/h | INTRAVENOUS | Status: DC
  Filled 2019-05-20: qty 100

## 2019-05-20 MED ORDER — VANCOMYCIN HCL 10 G IV SOLR
1250.0000 mg | INTRAVENOUS | Status: AC
  Administered 2019-05-21: 07:00:00 1250 mg via INTRAVENOUS
  Filled 2019-05-20: qty 1250

## 2019-05-20 MED ORDER — CHLORHEXIDINE GLUCONATE 4 % EX LIQD
60.0000 mL | Freq: Once | CUTANEOUS | Status: DC
  Filled 2019-05-20: qty 60

## 2019-05-20 MED ORDER — PLASMA-LYTE 148 IV SOLN
INTRAVENOUS | Status: AC
  Administered 2019-05-21: 500 mL
  Filled 2019-05-20: qty 2.5

## 2019-05-20 MED ORDER — SODIUM CHLORIDE 0.9 % IV SOLN
INTRAVENOUS | Status: DC
  Filled 2019-05-20: qty 30

## 2019-05-20 MED ORDER — POTASSIUM CHLORIDE 2 MEQ/ML IV SOLN
80.0000 meq | INTRAVENOUS | Status: DC
  Filled 2019-05-20: qty 40

## 2019-05-20 MED ORDER — BISACODYL 5 MG PO TBEC
5.0000 mg | DELAYED_RELEASE_TABLET | Freq: Once | ORAL | Status: AC
  Administered 2019-05-20: 16:00:00 5 mg via ORAL
  Filled 2019-05-20: qty 1

## 2019-05-20 MED ORDER — NOREPINEPHRINE 4 MG/250ML-% IV SOLN
0.0000 ug/min | INTRAVENOUS | Status: DC
  Filled 2019-05-20: qty 250

## 2019-05-20 MED ORDER — SODIUM CHLORIDE 0.9 % IV SOLN
1.5000 g | INTRAVENOUS | Status: AC
  Administered 2019-05-21: 1.5 g via INTRAVENOUS
  Filled 2019-05-20: qty 1.5

## 2019-05-20 MED ORDER — TRANEXAMIC ACID (OHS) PUMP PRIME SOLUTION
2.0000 mg/kg | INTRAVENOUS | Status: DC
  Filled 2019-05-20: qty 1.21

## 2019-05-20 MED ORDER — TRANEXAMIC ACID (OHS) BOLUS VIA INFUSION
15.0000 mg/kg | INTRAVENOUS | Status: AC
  Administered 2019-05-21: 909 mg via INTRAVENOUS
  Filled 2019-05-20: qty 909

## 2019-05-20 MED ORDER — DOPAMINE-DEXTROSE 3.2-5 MG/ML-% IV SOLN
0.0000 ug/kg/min | INTRAVENOUS | Status: DC
  Filled 2019-05-20: qty 250

## 2019-05-20 MED ORDER — CHLORHEXIDINE GLUCONATE 0.12 % MT SOLN
15.0000 mL | Freq: Once | OROMUCOSAL | Status: AC
  Administered 2019-05-21: 15 mL via OROMUCOSAL
  Filled 2019-05-20: qty 15

## 2019-05-20 MED ORDER — TEMAZEPAM 15 MG PO CAPS: 15.0000 mg | ORAL_CAPSULE | Freq: Once | ORAL | Status: DC | PRN

## 2019-05-20 MED ORDER — SODIUM CHLORIDE 0.9 % IV SOLN
750.0000 mg | INTRAVENOUS | Status: DC
  Filled 2019-05-20: qty 750

## 2019-05-20 MED ORDER — MILRINONE LACTATE IN DEXTROSE 20-5 MG/100ML-% IV SOLN
0.3000 ug/kg/min | INTRAVENOUS | Status: DC
  Filled 2019-05-20: qty 100

## 2019-05-20 MED ORDER — TRANEXAMIC ACID 1000 MG/10ML IV SOLN
1.5000 mg/kg/h | INTRAVENOUS | Status: DC
  Filled 2019-05-20: qty 25

## 2019-05-20 MED ORDER — MAGNESIUM SULFATE 50 % IJ SOLN
40.0000 meq | INTRAMUSCULAR | Status: DC
  Filled 2019-05-20: qty 9.85

## 2019-05-20 MED ORDER — NITROGLYCERIN IN D5W 200-5 MCG/ML-% IV SOLN
2.0000 ug/min | INTRAVENOUS | Status: AC
  Administered 2019-05-21: 15 ug/min via INTRAVENOUS
  Filled 2019-05-20: qty 250

## 2019-05-20 MED ORDER — EPINEPHRINE PF 1 MG/ML IJ SOLN
0.0000 ug/min | INTRAVENOUS | Status: DC
  Filled 2019-05-20: qty 4

## 2019-05-20 MED ORDER — CHLORHEXIDINE GLUCONATE 4 % EX LIQD
60.0000 mL | Freq: Once | CUTANEOUS | Status: AC
  Administered 2019-05-20: 4 via TOPICAL
  Filled 2019-05-20: qty 60

## 2019-05-20 MED ORDER — INSULIN REGULAR(HUMAN) IN NACL 100-0.9 UT/100ML-% IV SOLN
INTRAVENOUS | Status: DC
  Filled 2019-05-20: qty 100

## 2019-05-20 MED ORDER — VANCOMYCIN HCL 1000 MG IV SOLR
INTRAVENOUS | Status: AC
  Administered 2019-05-21: 10:00:00 1000 mL
  Filled 2019-05-20: qty 1000

## 2019-05-20 MED ORDER — PHENYLEPHRINE HCL-NACL 20-0.9 MG/250ML-% IV SOLN
30.0000 ug/min | INTRAVENOUS | Status: DC
  Filled 2019-05-20: qty 250

## 2019-05-20 MED ORDER — MANNITOL 20 % IV SOLN
Freq: Once | INTRAVENOUS | Status: DC
  Filled 2019-05-20: qty 13

## 2019-05-20 MED ORDER — ALBUTEROL SULFATE (2.5 MG/3ML) 0.083% IN NEBU
2.5000 mg | INHALATION_SOLUTION | Freq: Once | RESPIRATORY_TRACT | Status: AC
  Administered 2019-05-20: 2.5 mg via RESPIRATORY_TRACT

## 2019-05-20 NOTE — Anesthesia Preprocedure Evaluation (Addendum)
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 The left ventricle has mildly reduced systolic function, with an ejection fraction of 45-50%. The cavity size was mild to moderately dilated. There is mild concentric left ventricular hypertrophy. Left ventricular diastolic Doppler parameters are  indeterminate. Left ventricular diffuse hypokinesis.  2. Diffuse hypokinesis in basal to mid segments, slightly improved at apex. In multiple coronary territories.  3. The right ventricle has normal systolic function. The cavity was normal. There is no increase in right ventricular wall thickness. Right ventricular systolic pressure could not be assessed.  4. Left atrial size was mildly dilated.  5. Right atrial size was mildly dilated.  6. Trivial pericardial effusion is present.  7. The mitral valve is abnormal.  8. The aortic valve is abnormal. Aortic valve regurgitation is moderate to severe by color flow Doppler.  9. Eccentric aortic regurgitation seen, affecting mitral valve without clear preclosure. Likely at least moderate and possibly severe regurgitation. 10. The aorta is abnormal in size and structure. 11. The aortic arch is  normal in size and structure. 12. There is mild dilatation of the aortic root and of the ascending aorta measuring 40 mm.    Neuro/Psych negative neurological ROS  negative psych ROS   GI/Hepatic negative GI ROS, Neg liver ROS,   Endo/Other  Hypothyroidism   Renal/GU Renal disease     Musculoskeletal negative musculoskeletal ROS (+)   Abdominal   Peds  Hematology  (+) anemia , HLD   Anesthesia Other Findings CAD  AI  Reproductive/Obstetrics                            Anesthesia Physical Anesthesia Plan  ASA: IV  Anesthesia Plan: General   Post-op Pain Management:    Induction: Intravenous  PONV Risk Score and Plan: 1 and Ondansetron, Dexamethasone, Midazolam and Treatment may vary due to age or medical condition  Airway Management Planned: Oral ETT  Additional Equipment: Arterial line, CVP, PA Cath, TEE and Ultrasound Guidance Line Placement  Intra-op Plan:   Post-operative Plan: Post-operative intubation/ventilation  Informed Consent: I have reviewed the patients History and Physical, chart, labs and discussed the procedure including the risks, benefits and alternatives for the proposed anesthesia with the patient or authorized representative who has indicated his/her understanding and acceptance.       Plan Discussed with: CRNA  Anesthesia Plan Comments:        Anesthesia Quick Evaluation

## 2019-05-20 NOTE — Progress Notes (Signed)
PROGRESS NOTE    Dennis Gomez  ZOX:[id number] DOB: Jun 06, 1951 DOA: 05/16/2019 PCP: [name], MD   Brief Narrative:  [name] a67 y.o.male,w hypothyroidism, Copd, CHF (EF 35-40%), apparently admitted for chest pain on 05/14/2019 at [facility]. Chest pain w/up includedechocardiogram that revealed new systolic with combined diastolic congestive heart failure with an EF of 35-40% with moderate to severe aortic regurgitation seen. Cardiology consulted and recommended transfer to [facility] for cardiac catheterization and possible surgical consult for aortic regurgitation. Cath done with severe 3 vessel disease.  05/20/2019: Per CT surgery plan for aortic valve replacement and CABG tomorrow.   Assessment & Plan:   Principal Problem:   Aortic regurgitation Active Problems:   Hypothyroidism   COPD (chronic obstructive pulmonary disease) (HCC)   Chest pain   Acute kidney injury (HCC)   Chronic kidney disease (CKD), stage III (moderate) (HCC)   Coronary artery disease involving native coronary artery of native heart with angina pectoris (HCC)   Tobacco abuse   Severe aortic regurgitation   Chronic systolic congestive heart failure (HCC)   Essential hypertension   Hyperlipidemia LDL goal <70  CAD -cath shows severe multivessel disease and in need of CABG and valve repair.   -ASA/statin -no BB as HR low -CT surgery consult appreciated 05/20/2019: Per CT surgery plan for aortic valve replacement and CABG tomorrow.  Aortic regurgitation/Mitral regurgitation  -evaluated by cards: repeating echo, right and left heart cath today.  -Given need for CABG will need AVR- planned for Tuesday 05/20/2019: As mentioned above plan for aortic valve replacement tomorrow.  Cardiomyopathy  -EF 45-50 now -Cont lisinopril 10mg  po qday -Cont hydralazine 10mg  po tid,   Acute kidney injury vs CKD -baseline unknown -trending down 05/20/2019: Labs ordered for  a.m.  Hypothyroidism Cont Levothyroxine at 150 micrograms po qday   Hyperlipidemia (LDL =174) Cont Lipitor 40mg  po qhs  Copd stable at baseline. Not on home oxygen -duoneb prn -StartAnoro 1puff qday -Albuterol neb q6h prn   Anemia- unknown (perhaps CKD) -iron only mildly low  Tobacco abuse Nicotine patch PRN  Code Status: full Family Communication:  Plan of care discussed with patient and wife at the bedside. Disposition Plan: needs CABG/AVR   Consultants:  CVTS  cards   Subjective: Denies having any chest pain.  No major complaints.  Objective: Vitals:   05/19/19 1745 05/19/19 2105 05/19/19 2225 05/20/19 0800  BP: (!) 148/48 (!) 149/50  (!) 119/44  Pulse:      Resp:      Temp:   98.7 F (37.1 C)   TempSrc:   Oral   SpO2:    100%  Weight:      Height:        Intake/Output Summary (Last 24 hours) at 05/20/2019 1531 Last data filed at 05/20/2019 1300 Gross per 24 hour  Intake 764 ml  Output 1050 ml  Net -286 ml   Filed Weights   05/17/19 0500 05/18/19 0608 05/19/19 0553  Weight: 59.6 kg 56.9 kg 60.6 kg    Examination:  General exam: Appears calm and comfortable  Respiratory system: Clear to auscultation. Respiratory effort normal. Cardiovascular system: S1 & S2 heard, murmur Gastrointestinal system: Abdomen is nondistended, soft and nontender. Normal bowel sounds heard. Central nervous system: Alert and oriented. No focal neurological deficits. Extremities: Symmetric 5 x 5 power. Psychiatry: Judgement and insight appear normal. Mood & affect appropriate.     Data Reviewed: I have personally reviewed following labs and imaging studies  CBC: Recent Labs  Lab 05/17/19 0436 05/17/19 1508 05/18/19 0328 05/19/19 0506  WBC 6.7  --  8.2 7.0  HGB 12.6* 10.9*   10.5* 10.9* 10.3*  HCT 38.2*   36.5* 32.0*   31.0* 33.1* 31.1*  MCV 94.1  --  95.1 94.0  PLT 231  --  185 181   Basic Metabolic Panel: Recent Labs  Lab 05/17/19 0436  05/17/19 1508 05/18/19 0328 05/19/19 0506  NA 139 141   143 140 140  K 3.7 3.6   3.5 3.7 3.3*  CL 100  --  102 105  CO2 27  --  25 25  GLUCOSE 83  --  82 83  BUN 36*  --  42* 43*  CREATININE 1.46*  --  1.37* 1.20  CALCIUM 10.1  --  9.5 9.5   GFR: Estimated Creatinine Clearance: 52.6 mL/min (by C-G formula based on SCr of 1.2 mg/dL). Liver Function Tests: Recent Labs  Lab 05/17/19 0436 05/18/19 0328  AST 27 24  ALT 14 11  ALKPHOS 65 59  BILITOT 0.9 1.0  PROT 8.0 7.2  ALBUMIN 4.2 3.8   No results for input(s): LIPASE, AMYLASE in the last 168 hours. No results for input(s): AMMONIA in the last 168 hours. Coagulation Profile: Recent Labs  Lab 05/17/19 0436  INR 1.3*   Cardiac Enzymes: No results for input(s): CKTOTAL, CKMB, CKMBINDEX, TROPONINI in the last 168 hours. BNP (last 3 results) No results for input(s): PROBNP in the last 8760 hours. HbA1C: Recent Labs    05/18/19 0328  HGBA1C 5.6   CBG: No results for input(s): GLUCAP in the last 168 hours. Lipid Profile: No results for input(s): CHOL, HDL, LDLCALC, TRIG, CHOLHDL, LDLDIRECT in the last 72 hours. Thyroid Function Tests: No results for input(s): TSH, T4TOTAL, FREET4, T3FREE, THYROIDAB in the last 72 hours. Anemia Panel: Recent Labs    05/19/19 0506  FERRITIN 178  TIBC 280  IRON 37*   Sepsis Labs: No results for input(s): PROCALCITON, LATICACIDVEN in the last 168 hours.  Recent Results (from the past 240 hour(s))  SARS Coronavirus 2 (CEPHEID - Performed in [facility] hospital lab), Hosp Order     Status: None   Collection Time: 05/16/19 11:41 PM   Specimen: Nasopharyngeal Swab  Result Value Ref Range Status   SARS Coronavirus 2 NEGATIVE NEGATIVE Final    Comment: (NOTE) If result is NEGATIVE SARS-CoV-2 target nucleic acids are NOT DETECTED. The SARS-CoV-2 RNA is generally detectable in upper and lower  respiratory specimens during the acute phase of infection. The lowest  concentration of  SARS-CoV-2 viral copies this assay can detect is 250  copies / mL. A negative result does not preclude SARS-CoV-2 infection  and should not be used as the sole basis for treatment or other  patient management decisions.  A negative result may occur with  improper specimen collection / handling, submission of specimen other  than nasopharyngeal swab, presence of viral mutation(s) within the  areas targeted by this assay, and inadequate number of viral copies  (<250 copies / mL). A negative result must be combined with clinical  observations, patient history, and epidemiological information. If result is POSITIVE SARS-CoV-2 target nucleic acids are DETECTED. The SARS-CoV-2 RNA is generally detectable in upper and lower  respiratory specimens dur ing the acute phase of infection.  Positive  results are indicative of active infection with SARS-CoV-2.  Clinical  correlation with patient history and other diagnostic information is  necessary to determine patient infection status.  Positive results do  not rule out bacterial infection or co-infection with other viruses. If result is PRESUMPTIVE POSTIVE SARS-CoV-2 nucleic acids MAY BE PRESENT.   A presumptive positive result was obtained on the submitted specimen  and confirmed on repeat testing.  While 2019 novel coronavirus  (SARS-CoV-2) nucleic acids may be present in the submitted sample  additional confirmatory testing may be necessary for epidemiological  and / or clinical management purposes  to differentiate between  SARS-CoV-2 and other Sarbecovirus currently known to infect humans.  If clinically indicated additional testing with an alternate test  methodology [id number]) is advised. The SARS-CoV-2 RNA is generally  detectable in upper and lower respiratory sp ecimens during the acute  phase of infection. The expected result is Negative. Fact Sheet for Patients:  BoilerBrush.com.cy Fact Sheet for Healthcare  Providers: https://pope.com/ This test is not yet approved or cleared by the [place] FDA and has been authorized for detection and/or diagnosis of SARS-CoV-2 by FDA under an Emergency Use Authorization (EUA).  This EUA will remain in effect (meaning this test can be used) for the duration of the COVID-19 declaration under Section 564(b)(1) of the Act, 21 U.S.C. section 360bbb-3(b)(1), unless the authorization is terminated or revoked sooner. Performed at [facility], [address] [place], [place] [address]   Surgical pcr screen     Status: None   Collection Time: 05/20/19  1:39 PM   Specimen: Nasal Mucosa; Nasal Swab  Result Value Ref Range Status   MRSA, PCR NEGATIVE NEGATIVE Final   Staphylococcus aureus NEGATIVE NEGATIVE Final    Comment: (NOTE) The Xpert SA Assay (FDA approved for NASAL specimens in patients 22 years of age and older), is one component of a comprehensive surveillance program. It is not intended to diagnose infection nor to guide or monitor treatment. Performed at [facility], [address], [place], [place] [address]          Radiology Studies: Ct Coronary Morph W/cta Cor Teressa Senter W/ca W/cm &/or Wo/cm  Addendum Date: 05/20/2019   ADDENDUM REPORT: 05/20/2019 08:14 EXAM: OVER-READ INTERPRETATION  CT CHEST The following report is an over-read performed by radiologist Dr. Royal [facility] on 05/20/2019. This over-read does not include interpretation of cardiac or coronary anatomy or pathology. The cardiac CTA interpretation by the cardiologist is attached. COMPARISON:  No priors. FINDINGS: Extracardiac findings will be described separately under dictation for contemporaneously obtained CTA chest, abdomen and pelvis. IMPRESSION: Please see separate dictation for contemporaneously obtained CTA chest, abdomen and pelvis dated 05/19/2019 for full description of relevant extracardiac findings.  Electronically Signed   By: Trudie Reed M.D.   On: 05/20/2019 08:14   Result Date: 05/20/2019 CLINICAL DATA:  Aortic Regurgitation EXAM: Cardiac CTA TECHNIQUE: The patient was scanned on a Siemens Force 192 slice scanner. A 120 kV retrospective scan was triggered in the ascending thoracic aorta at 140 HU's. Gantry rotation speed was 250 msecs and collimation was .6 mm. No beta blockade or nitro were given. The 3D data set was reconstructed in 5% intervals of the R-R cycle. Systolic and diastolic phases were analyzed on a dedicated work station using MPR, MIP and VRT modes. The patient received 80 cc of contrast. FINDINGS: Aortic Valve: Bicuspid with fused right and left cusps. No stenosis with AVA over 4 cm2 by planimetry. Aorta: Moderate calcific atherosclerosis.  Bovine Arch Sino-tubular Junction: 3.3 cm Ascending Thoracic Aorta: 4.0 cm Aortic Arch: 2.5 cm Descending Thoracic Aorta: 3.9 cm  Sinus of Valsalva Measurements: Non-coronary: 3.8 cm Right - coronary: 3.3 cm Left -   coronary: 3.5 cm Coronary Artery Height above Annulus: Left Main: 11.45 mm above annulus Right Coronary: 14.3 mm above annulus Coronary Arteries: Severe stenosis in the proximal LAD, D1 and OM1. Left dominant circulation IMPRESSION: 1. Bi- cuspid AV with no stenosis severe AR by cath / echo 2. Mild ascending aortic root enlargement 4.0 cm with moderate calcific atherosclerosis 3.  Bovine Arch 4. Severe CAD involving the proximal LAD, D1 and OM1 with left dominant circulation and small RCA Charlton Haws Electronically Signed: By: Charlton Haws M.D. On: 05/20/2019 07:08   Ct Angio Chest Aorta W/cm &/or Wo/cm  Result Date: 05/20/2019 CLINICAL DATA:  68 year old male with history of severe aortic insufficiency. Preprocedural study prior to potential transcatheter aortic valve replacement (TAVR) procedure. EXAM: CT ANGIOGRAPHY CHEST, ABDOMEN AND PELVIS TECHNIQUE: Multidetector CT imaging through the chest, abdomen and pelvis was performed  using the standard protocol during bolus administration of intravenous contrast. Multiplanar reconstructed images and MIPs were obtained and reviewed to evaluate the vascular anatomy. CONTRAST:  95mL OMNIPAQUE IOHEXOL 350 MG/ML SOLN COMPARISON:  None. FINDINGS: CTA CHEST FINDINGS Cardiovascular: Heart size is borderline enlarged with concentric left ventricular hypertrophy. There is no significant pericardial fluid, thickening or pericardial calcification. Aortic atherosclerosis. No definite coronary artery calcifications. Thickening of the aortic valve. Mediastinum/Lymph Nodes: No pathologically enlarged mediastinal or hilar lymph nodes. Esophagus is unremarkable in appearance. No axillary lymphadenopathy. Lungs/Pleura: Areas of mild dependent atelectasis or scarring in the lower lobes of the lungs bilaterally and in the posterior aspect of the left upper lobe. No acute consolidative airspace disease. No pleural effusions. No suspicious appearing pulmonary nodules or masses are noted. Mild diffuse bronchial wall thickening with moderate centrilobular and paraseptal emphysema. Musculoskeletal/Soft Tissues: There are no aggressive appearing lytic or blastic lesions noted in the visualized portions of the skeleton. CTA ABDOMEN AND PELVIS FINDINGS Hepatobiliary: No suspicious cystic or solid hepatic lesions. No intra or extrahepatic biliary ductal dilatation. Gallbladder is normal in appearance. Pancreas: No pancreatic mass. No pancreatic ductal dilatation. No pancreatic or peripancreatic fluid collections or inflammatory changes. Spleen: Unremarkable. Adrenals/Urinary Tract: Mild multifocal cortical thinning in the kidneys bilaterally. No suspicious renal lesions. Bilateral adrenal glands are normal in appearance. No hydroureteronephrosis. Urinary bladder is unremarkable in appearance. Stomach/Bowel: Normal appearance of the stomach. No pathologic dilatation of small bowel or colon. Numerous colonic diverticulae are  noted, without surrounding inflammatory changes to suggest an acute diverticulitis at this time. Normal appendix. Vascular/Lymphatic: Aortic atherosclerosis with vascular findings and measurements pertinent to potential TAVR procedure, as detailed below. 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 No lymphadenopathy noted in the abdomen or pelvis. Reproductive: Prostate gland and seminal vesicles are unremarkable in appearance. Other: No significant volume of ascites.  No pneumoperitoneum. Musculoskeletal: There are no aggressive appearing lytic or blastic lesions noted in the visualized portions of the skeleton. VASCULAR MEASUREMENTS PERTINENT TO TAVR: AORTA: Minimal Aortic Diameter-10 x 15 mm Severity of Aortic Calcification-severe RIGHT PELVIS: Right Common Iliac Artery - Minimal Diameter-7.1 x 4.6 mm Tortuosity-mild Calcification-moderate Right External Iliac Artery - Minimal Diameter-5.4 x 3.8 mm Tortuosity-moderate Calcification-moderate Right Common Femoral Artery - Minimal Diameter-2.8 x 2.6 mm Tortuosity-mild Calcification-moderate LEFT PELVIS: Left Common Iliac Artery - Minimal Diameter-6.1 x 4.1 mm Tortuosity-mild Calcification-moderate Left External Iliac Artery - Minimal Diameter-2.9 x 3.0 mm Tortuosity-moderate Calcification-moderate Left Common Femoral Artery - Minimal Diameter-5.8 x 5.8 mm  Tortuosity-mild Calcification-moderate Review of the MIP images confirms the above findings. IMPRESSION: 1. Vascular findings and measurements pertinent to potential TAVR procedure, as detailed above. 2. Severe thickening of the aortic valve, compatible with the reported clinical history of severe aortic insufficiency. 3. Concentric left ventricular hypertrophy. 4.  Aortic Atherosclerosis (ICD10-I70.0). 5.  Moderate centrilobular and paraseptal emphysema (ICD10-J43.9). 6. Colonic diverticulosis without evidence of acute diverticulitis at this time. 7. Additional  incidental findings, as above. Electronically Signed   By: Trudie Reedaniel  Entrikin M.D.   On: 05/20/2019 09:00   Vas [place] Doppler Pre Cabg  Result Date: 05/19/2019 PREOPERATIVE VASCULAR EVALUATION  Indications:      Pre-CABG. Risk Factors:     Hyperlipidemia, coronary artery disease. Comparison Study: No prior studies. Performing Technologist: Olen Cordialollins, Greg Rvt  Examination Guidelines: A complete evaluation includes B-mode imaging, spectral Doppler, color Doppler, and power Doppler as needed of all accessible portions of each vessel. Bilateral testing is considered an integral part of a complete examination. Limited examinations for reoccurring indications may be performed as noted.  Right Carotid Findings: +----------+--------+-------+--------+--------------------------------+--------+             PSV cm/s EDV     Stenosis Describe                         Comments                       cm/s                                                        +----------+--------+-------+--------+--------------------------------+--------+  CCA Prox   164      0                smooth and heterogenous                    +----------+--------+-------+--------+--------------------------------+--------+  CCA Distal 99       4                smooth, heterogenous and                                                         calcific                                   +----------+--------+-------+--------+--------------------------------+--------+  ICA Prox   98       12               smooth and heterogenous                    +----------+--------+-------+--------+--------------------------------+--------+  ICA Distal 71       10                                                tortuous  +----------+--------+-------+--------+--------------------------------+--------+  ECA  153                                                                  +----------+--------+-------+--------+--------------------------------+--------+ Portions of this table  do not appear on this page. +----------+--------+-------+--------+------------+             PSV cm/s EDV cms Describe Arm Pressure  +----------+--------+-------+--------+------------+  Subclavian 292                       123           +----------+--------+-------+--------+------------+ +---------+--------+--+--------+--+---------+  Vertebral PSV cm/s 80 EDV cm/s 10 Antegrade  +---------+--------+--+--------+--+---------+ Left Carotid Findings: +----------+--------+--------+--------+-----------------------+--------+             PSV cm/s EDV cm/s Stenosis Describe                Comments  +----------+--------+--------+--------+-----------------------+--------+  CCA Prox   184                        smooth and heterogenous           +----------+--------+--------+--------+-----------------------+--------+  CCA Distal 96       0                 smooth and heterogenous           +----------+--------+--------+--------+-----------------------+--------+  ICA Prox   149      18                smooth and heterogenous           +----------+--------+--------+--------+-----------------------+--------+  ICA Distal 65       9                                         tortuous  +----------+--------+--------+--------+-----------------------+--------+  ECA        170      0                                                   +----------+--------+--------+--------+-----------------------+--------+ +----------+--------+--------+--------+------------+  Subclavian PSV cm/s EDV cm/s Describe Arm Pressure  +----------+--------+--------+--------+------------+             267                        150           +----------+--------+--------+--------+------------+ +---------+--------+--+--------+-+---------+  Vertebral PSV cm/s 67 EDV cm/s 5 Antegrade  +---------+--------+--+--------+-+---------+  ABI Findings: +--------+------------------+-----+--------+--------+  Right    Rt Pressure (mmHg) Index Waveform Comment    +--------+------------------+-----+--------+--------+  Brachial 123                      biphasic           +--------+------------------+-----+--------+--------+  PTA      115                0.77  biphasic           +--------+------------------+-----+--------+--------+  DP       120                0.80  biphasic           +--------+------------------+-----+--------+--------+ +--------+------------------+-----+---------+-------+  Left     Lt Pressure (mmHg) Index Waveform  Comment  +--------+------------------+-----+---------+-------+  Brachial 150                      triphasic          +--------+------------------+-----+---------+-------+  PTA      121                0.81  biphasic           +--------+------------------+-----+---------+-------+  DP       120                0.80  biphasic           +--------+------------------+-----+---------+-------+  Right Doppler Findings: +-----------+--------+-----+---------+-----------------------------------------+  Site        Pressure Index Doppler   Comments                                   +-----------+--------+-----+---------+-----------------------------------------+  Brachial    123            biphasic                                             +-----------+--------+-----+---------+-----------------------------------------+  Radial                     biphasic                                             +-----------+--------+-----+---------+-----------------------------------------+  Ulnar                      triphasic                                            +-----------+--------+-----+---------+-----------------------------------------+  Palmar Arch                          Palmar waveforms are obliterated with                                            radial and ulnar compression.              +-----------+--------+-----+---------+-----------------------------------------+  Left Doppler Findings:  +-----------+--------+-----+---------+-----------------------------------------+  Site        Pressure Index Doppler   Comments                                   +-----------+--------+-----+---------+-----------------------------------------+  Brachial    150            triphasic                                            +-----------+--------+-----+---------+-----------------------------------------+  Radial                     triphasic                                            +-----------+--------+-----+---------+-----------------------------------------+  Ulnar                      triphasic                                            +-----------+--------+-----+---------+-----------------------------------------+  Palmar Arch                          Palmar waveforms are obliterated with                                            radial and ulnar compression.              +-----------+--------+-----+---------+-----------------------------------------+  Summary: Right Carotid: Velocities in the right ICA are consistent with a 1-39% stenosis. Left Carotid: Velocities in the left ICA are consistent with a 1-39% stenosis. Vertebrals: Bilateral vertebral arteries demonstrate antegrade flow. Right ABI: Resting right ankle-brachial index indicates mild right lower extremity arterial disease. Left ABI: Resting left ankle-brachial index indicates mild left lower extremity arterial disease.     Preliminary    Ct Angio Abd/pel W/ And/or W/o  Result Date: 05/20/2019 CLINICAL DATA:  68 year old male with history of severe aortic insufficiency. Preprocedural study prior to potential transcatheter aortic valve replacement (TAVR) procedure. EXAM: CT ANGIOGRAPHY CHEST, ABDOMEN AND PELVIS TECHNIQUE: Multidetector CT imaging through the chest, abdomen and pelvis was performed using the standard protocol during bolus administration of intravenous contrast. Multiplanar reconstructed images and MIPs were obtained and reviewed to  evaluate the vascular anatomy. CONTRAST:  95mL OMNIPAQUE IOHEXOL 350 MG/ML SOLN COMPARISON:  None. FINDINGS: CTA CHEST FINDINGS Cardiovascular: Heart size is borderline enlarged with concentric left ventricular hypertrophy. There is no significant pericardial fluid, thickening or pericardial calcification. Aortic atherosclerosis. No definite coronary artery calcifications. Thickening of the aortic valve. Mediastinum/Lymph Nodes: No pathologically enlarged mediastinal or hilar lymph nodes. Esophagus is unremarkable in appearance. No axillary lymphadenopathy. Lungs/Pleura: Areas of mild dependent atelectasis or scarring in the lower lobes of the lungs bilaterally and in the posterior aspect of the left upper lobe. No acute consolidative airspace disease. No pleural effusions. No suspicious appearing pulmonary nodules or masses are noted. Mild diffuse bronchial wall thickening with moderate centrilobular and paraseptal emphysema. Musculoskeletal/Soft Tissues: There are no aggressive appearing lytic or blastic lesions noted in the visualized portions of the skeleton. CTA ABDOMEN AND PELVIS FINDINGS Hepatobiliary: No suspicious cystic or solid hepatic lesions. No intra or extrahepatic biliary ductal dilatation. Gallbladder is normal in appearance. Pancreas: No pancreatic mass. No pancreatic ductal dilatation. No pancreatic or peripancreatic fluid collections or inflammatory changes. Spleen: Unremarkable. Adrenals/Urinary Tract: Mild multifocal cortical thinning in the kidneys bilaterally. No suspicious renal lesions. Bilateral adrenal glands are normal in appearance. No hydroureteronephrosis. Urinary bladder is unremarkable in appearance. Stomach/Bowel: Normal appearance of the stomach. No pathologic dilatation of small bowel or colon. Numerous  colonic diverticulae are noted, without surrounding inflammatory changes to suggest an acute diverticulitis at this time. Normal appendix. Vascular/Lymphatic: Aortic  atherosclerosis with vascular findings and measurements pertinent to potential TAVR procedure, as detailed below. 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 No lymphadenopathy noted in the abdomen or pelvis. Reproductive: Prostate gland and seminal vesicles are unremarkable in appearance. Other: No significant volume of ascites.  No pneumoperitoneum. Musculoskeletal: There are no aggressive appearing lytic or blastic lesions noted in the visualized portions of the skeleton. VASCULAR MEASUREMENTS PERTINENT TO TAVR: AORTA: Minimal Aortic Diameter-10 x 15 mm Severity of Aortic Calcification-severe RIGHT PELVIS: Right Common Iliac Artery - Minimal Diameter-7.1 x 4.6 mm Tortuosity-mild Calcification-moderate Right External Iliac Artery - Minimal Diameter-5.4 x 3.8 mm Tortuosity-moderate Calcification-moderate Right Common Femoral Artery - Minimal Diameter-2.8 x 2.6 mm Tortuosity-mild Calcification-moderate LEFT PELVIS: Left Common Iliac Artery - Minimal Diameter-6.1 x 4.1 mm Tortuosity-mild Calcification-moderate Left External Iliac Artery - Minimal Diameter-2.9 x 3.0 mm Tortuosity-moderate Calcification-moderate Left Common Femoral Artery - Minimal Diameter-5.8 x 5.8 mm Tortuosity-mild Calcification-moderate Review of the MIP images confirms the above findings. IMPRESSION: 1. Vascular findings and measurements pertinent to potential TAVR procedure, as detailed above. 2. Severe thickening of the aortic valve, compatible with the reported clinical history of severe aortic insufficiency. 3. Concentric left ventricular hypertrophy. 4.  Aortic Atherosclerosis (ICD10-I70.0). 5.  Moderate centrilobular and paraseptal emphysema (ICD10-J43.9). 6. Colonic diverticulosis without evidence of acute diverticulitis at this time. 7. Additional incidental findings, as above. Electronically Signed   By: Vinnie Langton M.D.   On: 05/20/2019 09:00        Scheduled Meds:  aspirin  EC  81 mg Oral Daily   atorvastatin  40 mg Oral q1800   bisacodyl  5 mg Oral Once   chlorhexidine  60 mL Topical Once   And   [START ON 05/21/2019] chlorhexidine  60 mL Topical Once   [START ON 05/21/2019] chlorhexidine  15 mL Mouth/Throat Once   furosemide  40 mg Oral Daily   [START ON 05/21/2019] heparin-papaverine-plasmalyte irrigation   Irrigation To OR   hydrALAZINE  10 mg Oral TID   [START ON 05/21/2019] insulin   Intravenous To OR   [START ON 05/21/2019] [place] Blood Cardioplegia vial (lidocaine/magnesium/mannitol 0.26g-4g-6.4g)   Intracoronary Once   levothyroxine  150 mcg Oral Q0600   lisinopril  10 mg Oral Daily   [START ON 05/21/2019] magnesium sulfate  40 mEq Other To OR   [START ON 05/21/2019] metoprolol tartrate  12.5 mg Oral Once   pantoprazole  40 mg Oral Daily   [START ON 05/21/2019] phenylephrine  30-200 mcg/min Intravenous To OR   [START ON 05/21/2019] potassium chloride  80 mEq Other To OR   potassium chloride  20 mEq Oral Daily   sodium chloride flush  3 mL Intravenous Q12H   sodium chloride flush  3 mL Intravenous Q12H   sodium chloride flush  3 mL Intravenous Q12H   [START ON 05/21/2019] tranexamic acid  15 mg/kg Intravenous To OR   [START ON 05/21/2019] tranexamic acid  2 mg/kg Intracatheter To OR   umeclidinium-vilanterol  1 puff Inhalation Daily   [START ON 05/21/2019] vancomycin 1000 mg in NS (1000 ml) irrigation for Dr. Roxy Manns case   Irrigation To OR   Continuous Infusions:  sodium chloride     sodium chloride     [START ON 05/21/2019] cefUROXime (ZINACEF)  IV     cefUROXime (ZINACEF)  IV     [  START ON 05/21/2019] dexmedetomidine     [START ON 05/21/2019] DOPamine     [START ON 05/21/2019] epinephrine     [START ON 05/21/2019] heparin 30,000 units/NS 1000 mL solution for CELLSAVER     [START ON 05/21/2019] milrinone     [START ON 05/21/2019] nitroGLYCERIN     [START ON 05/21/2019] norepinephrine     [START ON 05/21/2019] tranexamic acid (CYKLOKAPRON)  infusion ([facility])     [START ON 05/21/2019] vancomycin       LOS: 3 days    Time spent: 25 min    Gleb Mcguire, MD [facility] Pager on amion  If 7PM-7AM, please contact night-coverage [email or web address] Password [email or web address] 05/20/2019, 3:31 PM

## 2019-05-20 NOTE — Progress Notes (Addendum)
Progress Note  Patient Name: Dennis Gomez Date of Encounter: 05/20/2019   Primary Cardiologist: Verne Carrowhristopher McAlhany, MD (New Patient)  Subjective   No acute overnight events. Doing well this morning. No chest pain, shortness of breath, or palpitations.   Inpatient Medications    Scheduled Meds: . aspirin EC  81 mg Oral Daily  . atorvastatin  40 mg Oral q1800  . bisacodyl  5 mg Oral Once  . chlorhexidine  60 mL Topical Once   And  . [START ON 05/21/2019] chlorhexidine  60 mL Topical Once  . [START ON 05/21/2019] chlorhexidine  15 mL Mouth/Throat Once  . furosemide  40 mg Oral Daily  . hydrALAZINE  10 mg Oral TID  . levothyroxine  150 mcg Oral Q0600  . lisinopril  10 mg Oral Daily  . [START ON 05/21/2019] metoprolol tartrate  12.5 mg Oral Once  . pantoprazole  40 mg Oral Daily  . potassium chloride  20 mEq Oral Daily  . sodium chloride flush  3 mL Intravenous Q12H  . sodium chloride flush  3 mL Intravenous Q12H  . sodium chloride flush  3 mL Intravenous Q12H  . umeclidinium-vilanterol  1 puff Inhalation Daily   Continuous Infusions: . sodium chloride    . sodium chloride     PRN Meds: sodium chloride, sodium chloride, acetaminophen **OR** acetaminophen, albuterol, nicotine, ondansetron (ZOFRAN) IV, sodium chloride flush, sodium chloride flush, sodium chloride flush, temazepam   Vital Signs    Vitals:   05/19/19 1423 05/19/19 1745 05/19/19 2105 05/19/19 2225  BP: (!) 144/44 (!) 148/48 (!) 149/50   Pulse: (!) 56     Resp:      Temp: 97.9 F (36.6 C)   98.7 F (37.1 C)  TempSrc: Oral   Oral  SpO2: 97%     Weight:      Height:        Intake/Output Summary (Last 24 hours) at 05/20/2019 0733 Last data filed at 05/20/2019 0200 Gross per 24 hour  Intake 803 ml  Output 875 ml  Net -72 ml   Last 3 Weights 05/19/2019 05/18/2019 05/17/2019  Weight (lbs) 133 lb 9.6 oz 125 lb 6.4 oz 131 lb 4.8 oz  Weight (kg) 60.601 kg 56.881 kg 59.557 kg      Telemetry    Sinus rhythm  with heart rates mostly in the 50's to 60's. - Personally Reviewed  ECG    No ECG tracing today. - Personally Reviewed  Physical Exam   GEN: Resting comfortably in no acute distress.   Neck: Supple.  Cardiac: Bradycardic with regular rhythm. III/VI diastolic murmur. No rubs or gallops.  Respiratory: Clear to auscultation bilaterally. No wheezes, rhonchi, or rales. GI: Soft, nontender, non-distended. Bowel sounds present. MS: No edema. No deformity. Skin: Warm and dry. Neuro:  No focal deficits.  Psych: Normal affect. Responds appropriately.  Labs    High Sensitivity Troponin:  No results for input(s): TROPONINIHS in the last 720 hours.    Cardiac EnzymesNo results for input(s): TROPONINI in the last 168 hours. No results for input(s): TROPIPOC in the last 168 hours.   Chemistry Recent Labs  Lab 05/17/19 0436 05/17/19 1508 05/18/19 0328 05/19/19 0506  NA 139 141  143 140 140  K 3.7 3.6  3.5 3.7 3.3*  CL 100  --  102 105  CO2 27  --  25 25  GLUCOSE 83  --  82 83  BUN 36*  --  42* 43*  CREATININE 1.46*  --  1.37* 1.20  CALCIUM 10.1  --  9.5 9.5  PROT 8.0  --  7.2  --   ALBUMIN 4.2  --  3.8  --   AST 27  --  24  --   ALT 14  --  11  --   ALKPHOS 65  --  59  --   BILITOT 0.9  --  1.0  --   GFRNONAA 50*  --  54* >60  GFRAA 58*  --  >60 >60  ANIONGAP 12  --  13 10     Hematology Recent Labs  Lab 05/17/19 0436 05/17/19 1508 05/18/19 0328 05/19/19 0506  WBC 6.7  --  8.2 7.0  RBC 4.06*  --  3.48* 3.31*  HGB 12.6* 10.9*  10.5* 10.9* 10.3*  HCT 38.2* 32.0*  31.0* 33.1* 31.1*  MCV 94.1  --  95.1 94.0  MCH 31.0  --  31.3 31.1  MCHC 33.0  --  32.9 33.1  RDW 16.2*  --  16.2* 16.0*  PLT 231  --  185 181    BNPNo results for input(s): BNP, PROBNP in the last 168 hours.   DDimer No results for input(s): DDIMER in the last 168 hours.   Radiology    Ct Coronary Morph W/cta Cor W/score W/ca W/cm &/or Wo/cm  Result Date: 05/20/2019 CLINICAL DATA:  Aortic  Regurgitation EXAM: Cardiac CTA TECHNIQUE: The patient was scanned on a Siemens Force 192 slice scanner. A 120 kV retrospective scan was triggered in the ascending thoracic aorta at 140 HU's. Gantry rotation speed was 250 msecs and collimation was .6 mm. No beta blockade or nitro were given. The 3D data set was reconstructed in 5% intervals of the R-R cycle. Systolic and diastolic phases were analyzed on a dedicated work station using MPR, MIP and VRT modes. The patient received 80 cc of contrast. FINDINGS: Aortic Valve: Bicuspid with fused right and left cusps. No stenosis with AVA over 4 cm2 by planimetry. Aorta: Moderate calcific atherosclerosis.  Bovine Arch Sino-tubular Junction: 3.3 cm Ascending Thoracic Aorta: 4.0 cm Aortic Arch: 2.5 cm Descending Thoracic Aorta: 3.9 cm Sinus of Valsalva Measurements: Non-coronary: 3.8 cm Right - coronary: 3.3 cm Left -   coronary: 3.5 cm Coronary Artery Height above Annulus: Left Main: 11.45 mm above annulus Right Coronary: 14.3 mm above annulus Coronary Arteries: Severe stenosis in the proximal LAD, D1 and OM1. Left dominant circulation IMPRESSION: 1. Bi- cuspid AV with no stenosis severe AR by cath / echo 2. Mild ascending aortic root enlargement 4.0 cm with moderate calcific atherosclerosis 3.  Bovine Arch 4. Severe CAD involving the proximal LAD, D1 and OM1 with left dominant circulation and small RCA Charlton HawsPeter Devory Mckinzie Electronically Signed   By: Charlton HawsPeter  Laynee Lockamy M.D.   On: 05/20/2019 07:08   [place] Doppler Pre Cabg  Result Date: 05/19/2019 PREOPERATIVE VASCULAR EVALUATION  Indications:      Pre-CABG. Risk Factors:     Hyperlipidemia, coronary artery disease. Comparison Study: No prior studies. Performing Technologist: Olen Cordialollins, Greg Rvt  Examination Guidelines: A complete evaluation includes B-mode imaging, spectral Doppler, color Doppler, and power Doppler as needed of all accessible portions of each vessel. Bilateral testing is considered an integral part of a complete  examination. Limited examinations for reoccurring indications may be performed as noted.  Right Carotid Findings: +----------+--------+-------+--------+--------------------------------+--------+           PSV cm/sEDV    StenosisDescribe  Comments                   cm/s                                                    +----------+--------+-------+--------+--------------------------------+--------+ CCA Prox  164     0              smooth and heterogenous                  +----------+--------+-------+--------+--------------------------------+--------+ CCA Distal99      4              smooth, heterogenous and                                                  calcific                                 +----------+--------+-------+--------+--------------------------------+--------+ ICA Prox  98      12             smooth and heterogenous                  +----------+--------+-------+--------+--------------------------------+--------+ ICA Distal71      10                                             tortuous +----------+--------+-------+--------+--------------------------------+--------+ ECA       153                                                             +----------+--------+-------+--------+--------------------------------+--------+ Portions of this table do not appear on this page. +----------+--------+-------+--------+------------+           PSV cm/sEDV cmsDescribeArm Pressure +----------+--------+-------+--------+------------+ Subclavian292                    123          +----------+--------+-------+--------+------------+ +---------+--------+--+--------+--+---------+ VertebralPSV cm/s80EDV cm/s10Antegrade +---------+--------+--+--------+--+---------+ Left Carotid Findings: +----------+--------+--------+--------+-----------------------+--------+           PSV cm/sEDV cm/sStenosisDescribe               Comments  +----------+--------+--------+--------+-----------------------+--------+ CCA Prox  184                     smooth and heterogenous         +----------+--------+--------+--------+-----------------------+--------+ CCA Distal96      0               smooth and heterogenous         +----------+--------+--------+--------+-----------------------+--------+ ICA Prox  149     18              smooth and heterogenous         +----------+--------+--------+--------+-----------------------+--------+ ICA Distal65      9  tortuous +----------+--------+--------+--------+-----------------------+--------+ ECA       170     0                                               +----------+--------+--------+--------+-----------------------+--------+ +----------+--------+--------+--------+------------+ SubclavianPSV cm/sEDV cm/sDescribeArm Pressure +----------+--------+--------+--------+------------+           267                     150          +----------+--------+--------+--------+------------+ +---------+--------+--+--------+-+---------+ VertebralPSV cm/s67EDV cm/s5Antegrade +---------+--------+--+--------+-+---------+  ABI Findings: +--------+------------------+-----+--------+--------+ Right   Rt Pressure (mmHg)IndexWaveformComment  +--------+------------------+-----+--------+--------+ [id number]                    biphasic         +--------+------------------+-----+--------+--------+ PTA     115               0.77 biphasic         +--------+------------------+-----+--------+--------+ DP      120               0.80 biphasic         +--------+------------------+-----+--------+--------+ +--------+------------------+-----+---------+-------+ Left    Lt Pressure (mmHg)IndexWaveform Comment +--------+------------------+-----+---------+-------+ Brachial150                    triphasic         +--------+------------------+-----+---------+-------+ PTA     121               0.81 biphasic         +--------+------------------+-----+---------+-------+ DP      120               0.80 biphasic         +--------+------------------+-----+---------+-------+  Right Doppler Findings: +-----------+--------+-----+---------+-----------------------------------------+ Site       PressureIndexDoppler  Comments                                  +-----------+--------+-----+---------+-----------------------------------------+ Brachial   123          biphasic                                           +-----------+--------+-----+---------+-----------------------------------------+ Radial                  biphasic                                           +-----------+--------+-----+---------+-----------------------------------------+ Ulnar                   triphasic                                          +-----------+--------+-----+---------+-----------------------------------------+ Palmar Arch                      Palmar waveforms are obliterated with  radial and ulnar compression.             +-----------+--------+-----+---------+-----------------------------------------+  Left Doppler Findings: +-----------+--------+-----+---------+-----------------------------------------+ Site       PressureIndexDoppler  Comments                                  +-----------+--------+-----+---------+-----------------------------------------+ Brachial   150          triphasic                                          +-----------+--------+-----+---------+-----------------------------------------+ Radial                  triphasic                                          +-----------+--------+-----+---------+-----------------------------------------+ Ulnar                   triphasic                                           +-----------+--------+-----+---------+-----------------------------------------+ Palmar Arch                      Palmar waveforms are obliterated with                                      radial and ulnar compression.             +-----------+--------+-----+---------+-----------------------------------------+  Summary: Right Carotid: Velocities in the right ICA are consistent with a 1-39% stenosis. Left Carotid: Velocities in the left ICA are consistent with a 1-39% stenosis. Vertebrals: Bilateral vertebral arteries demonstrate antegrade flow. Right ABI: Resting right ankle-brachial index indicates mild right lower extremity arterial disease. Left ABI: Resting left ankle-brachial index indicates mild left lower extremity arterial disease.     Preliminary     Cardiac Studies   Echocardiogram 05/17/2019: Impressions: 1. The left ventricle has mildly reduced systolic function, with an ejection fraction of 45-50%. The cavity size was mild to moderately dilated. There is mild concentric left ventricular hypertrophy. Left ventricular diastolic Doppler parameters are  indeterminate. Left ventricular diffuse hypokinesis.  2. Diffuse hypokinesis in basal to mid segments, slightly improved at apex. In multiple coronary territories.  3. The right ventricle has normal systolic function. The cavity was normal. There is no increase in right ventricular wall thickness. Right ventricular systolic pressure could not be assessed.  4. Left atrial size was mildly dilated.  5. Right atrial size was mildly dilated.  6. Trivial pericardial effusion is present.  7. The mitral valve is abnormal.  8. The aortic valve is abnormal. Aortic valve regurgitation is moderate to severe by color flow Doppler.  9. Eccentric aortic regurgitation seen, affecting mitral valve without clear preclosure. Likely at least moderate and possibly severe regurgitation. 10. The aorta is abnormal in size and structure. 11. The aortic  arch is normal in size and structure. 12. There is mild dilatation of the aortic root and of the ascending aorta measuring 40 mm. 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 Bi- cuspid AV with no stenosis severe AR by cath / echo 2. Mild ascending aortic root enlargement 4.0 cm with moderate calcific atherosclerosis 3.  Bovine Arch 4. Severe CAD involving the proximal LAD, D1 and OM1 with left dominant circulation and small RCA   Patient Profile   Mr. Tamera StandsStoneman is a 68 y.o. male with a history of COPD, hypothyroidism, and tobacco abuse who [facility] for further evaluation of chest pain. Echo showed severely dilated LV with EF of 35-40% and moderate to severe aortic regurgitation. Troponin mildly elevated. Patient [facility]. Patient found to have severe multivessel CAD and CABG/AVR was recommended.  Assessment & Plan    Severe Multivessel CAD - Cardiac catheterization on 7/31 showed severe multivessel CAD. - Stable. Denies any angina. - Continue aspirin and high-intensity statin. Beta-blocker currently on hold due to COPD and resting bradycardia. - CABG/AVR tentatively planned for tomorrow.   Chronic Systolic CHF/Ischemic Cardiomyopathy - Repeat Echo here showed LVEF of 45-50% with diffuse hypokinesis in basal to mid segments, slightly improved at apex. - Right heart catheterization showed normal right heart hemodynamics with low right-sided filling pressures. - Continue PO Lasix 40mg  daily. - Continue Lisinopril 10mg  daily. - Continue Hydralazine 10mg   three times daily. - No beta-blockers due to COPD and resting bradycardia.  - Continue to monitor daily weight, strict I/O's, and renal function.  Aortic Regurgitation - Moderately severe (3-4+) on cardiac catheterization. - Cardiac gated CTA performed yesterday and showed bicuspid AV with no stenosis, bovine arch, and mild ascending aortic enlargement 4.0cm and moderate calcific atherosclerosis. - CABG/AVR tentatively scheduled for tomorrow.   Hypertension - BP mildly elevated. Most recent BP 149/50. - Continue Lisinopril 10mg  daily. - Continue Hydralazine 10mg  three times daily.  Hyperlipidemia - Continue Lipitor 40mg  daily.  Hypothyroidism - Continue Synthroid per primary team.  For questions or updates, please contact [facility] Please consult [email or web address] for contact info under        Signed, Corrin ParkerCallie E Goodrich, PA-C  05/20/2019, 7:33 AM    Patient examined chart reviewed Exam with chronically ill thin white male. Short AR murmur prominent PMI Lungs clear Reviewed CTA done over weekend and he has bicuspid AV with mild aortic root dilatation 4.0 cm With bovine arch.  For CABG and AVR with Dr Cornelius Moraswen tomorrow   Charlton HawsPeter Jamal Haskin

## 2019-05-20 NOTE — Plan of Care (Signed)
  Problem: Education: Goal: Knowledge of General Education information will improve Description: Including pain rating scale, medication(s)/side effects and non-pharmacologic comfort measures Outcome: Progressing   Problem: Clinical Measurements: Goal: Will remain free from infection Outcome: Progressing Goal: Respiratory complications will improve Outcome: Progressing Goal: Cardiovascular complication will be avoided Outcome: Progressing   Problem: Activity: Goal: Risk for activity intolerance will decrease Outcome: Progressing   Problem: Nutrition: Goal: Adequate nutrition will be maintained Outcome: Not Progressing  Per pt he doesn't have an appetite so pt doesn't eat much.   Problem: Elimination: Goal: Will not experience complications related to urinary retention Outcome: Progressing   Problem: Pain Managment: Goal: General experience of comfort will improve Outcome: Progressing   Problem: Safety: Goal: Ability to remain free from injury will improve Outcome: Progressing   Problem: Skin Integrity: Goal: Risk for impaired skin integrity will decrease Outcome: Progressing

## 2019-05-20 NOTE — Progress Notes (Signed)
[address]       [place] [address]             [phone]     CARDIOTHORACIC SURGERY PROGRESS NOTE  3 Days Post-Op  S/P Procedure(s) (LRB): RIGHT/LEFT HEART CATH AND CORONARY ANGIOGRAPHY (N/A)  Subjective: No complaints.  No chest pain.  Objective: Vital signs in last 24 hours: Temp:  [97.9 F (36.6 C)-98.7 F (37.1 C)] 98.7 F (37.1 C) (08/02 2225) Pulse Rate:  [56] 56 (08/02 1423) Cardiac Rhythm: Normal sinus rhythm (08/03 0800) BP: (119-151)/(44-51) 119/44 (08/03 0800) SpO2:  [97 %-100 %] 100 % (08/03 0800)  Physical Exam:  Rhythm:   sinus  Breath sounds: clear  Heart sounds:  RRR w/ holodiastolic murmur  Incisions:  n/a  Abdomen:  soft  Extremities:  warm   Intake/Output from previous day: 08/02 0701 - 08/03 0700 In: 803 [P.O.:800; I.V.:3] Out: 875 [Urine:875] Intake/Output this shift: No intake/output data recorded.  Lab Results: Recent Labs    05/18/19 0328 05/19/19 0506  WBC 8.2 7.0  HGB 10.9* 10.3*  HCT 33.1* 31.1*  PLT 185 181   BMET:  Recent Labs    05/18/19 0328 05/19/19 0506  NA 140 140  K 3.7 3.3*  CL 102 105  CO2 25 25  GLUCOSE 82 83  BUN 42* 43*  CREATININE 1.37* 1.20  CALCIUM 9.5 9.5    CBG (last 3)  No results for input(s): GLUCAP in the last 72 hours. PT/INR:  No results for input(s): LABPROT, INR in the last 72 hours.  CBS:WHQPRFF CTA  TECHNIQUE: The patient was scanned on a Siemens Force 638 slice scanner. A 120 kV retrospective scan was triggered in the ascending thoracic aorta at 140 HU's. Gantry rotation speed was 250 msecs and collimation was .6 mm. No beta blockade or nitro were given. The 3D data set was reconstructed in 5% intervals of the R-R cycle. Systolic and diastolic phases were analyzed on a dedicated work station using MPR, MIP and VRT modes. The patient received 80 cc of contrast.  FINDINGS: Aortic Valve: Bicuspid with fused right and left cusps. No stenosis with AVA over  4 cm2 by planimetry.  Aorta: Moderate calcific atherosclerosis.  Bovine Arch  Sino-tubular Junction: 3.3 cm  Ascending Thoracic Aorta: 4.0 cm  Aortic Arch: 2.5 cm  Descending Thoracic Aorta: 3.9 cm  Sinus of Valsalva Measurements:  Non-coronary: 3.8 cm  Right - coronary: 3.3 cm  Left -   coronary: 3.5 cm  Coronary Artery Height above Annulus:  Left Main: 11.45 mm above annulus  Right Coronary: 14.3 mm above annulus  Coronary Arteries: Severe stenosis in the proximal LAD, D1 and OM1. Left dominant circulation  IMPRESSION: 1. Bi- cuspid AV with no stenosis severe AR by cath / echo  2. Mild ascending aortic root enlargement 4.0 cm with moderate calcific atherosclerosis  3.  Bovine Arch  4. Severe CAD involving the proximal LAD, D1 and OM1 with left dominant circulation and small RCA  Jenkins Rouge  Electronically Signed: By: Jenkins Rouge M.D. On: 05/20/2019 07:08    Assessment/Plan: S/P Procedure(s) (LRB): RIGHT/LEFT HEART CATH AND CORONARY ANGIOGRAPHY (N/A)  Results of cardiac gated CTA noted and personally reviewed.  Mild enlargement of the aortic root and ascending aorta, no significant aneurysm.  Sievers type I bicuspid aortic valve with severe aortic insufficiency.  We plan aortic valve replacement and coronary artery bypass grafting first case tomorrow morning.    I have  again reviewed the indications, risks, and potential benefits of aortic valve replacement and coronary artery bypass grafting with the patient at the bedside.  He understands and accepts all potential risks of surgery including but not limited to risk of death, stroke or other neurologic complication, myocardial infarction, congestive heart failure, respiratory failure, renal failure, bleeding requiring transfusion and/or reexploration, arrhythmia, infection or other wound complications, pneumonia, pleural and/or pericardial effusion, pulmonary embolus, aortic dissection or  other major vascular complication, delayed complications related to valve repair or replacement including but not limited to structural valve deterioration and failure, thrombosis, embolization, endocarditis, or paravalvular leak, and late recurrence of symptomatic coronary artery disease.  All of his questions have been answered.   I spent in excess of 15 minutes during the conduct of this hospital encounter and >50% of this time involved direct face-to-face encounter with the patient for counseling and/or coordination of their care.    Purcell Nails, MD 05/20/2019 9:01 AM

## 2019-05-21 ENCOUNTER — Encounter (HOSPITAL_COMMUNITY): Payer: Self-pay | Admitting: Thoracic Surgery (Cardiothoracic Vascular Surgery)

## 2019-05-21 ENCOUNTER — Encounter (HOSPITAL_COMMUNITY)
Admission: AD | Disposition: A | Discharge status: Home Health | Payer: Self-pay | Source: Other Acute Inpatient Hospital | Attending: Thoracic Surgery (Cardiothoracic Vascular Surgery)

## 2019-05-21 ENCOUNTER — Inpatient Hospital Stay (HOSPITAL_COMMUNITY): Payer: Medicare Other

## 2019-05-21 ENCOUNTER — Inpatient Hospital Stay (HOSPITAL_COMMUNITY): Payer: Medicare Other | Admitting: Certified Registered Nurse Anesthetist

## 2019-05-21 DIAGNOSIS — Z953 Presence of xenogenic heart valve: Secondary | ICD-10-CM

## 2019-05-21 DIAGNOSIS — Z951 Presence of aortocoronary bypass graft: Secondary | ICD-10-CM

## 2019-05-21 HISTORY — DX: Presence of aortocoronary bypass graft: Z95.1

## 2019-05-21 HISTORY — PX: TEE WITHOUT CARDIOVERSION: SHX5443

## 2019-05-21 HISTORY — DX: Presence of xenogenic heart valve: Z95.3

## 2019-05-21 HISTORY — PX: CORONARY ARTERY BYPASS GRAFT: SHX141

## 2019-05-21 HISTORY — PX: AORTIC VALVE REPLACEMENT: SHX41

## 2019-05-21 LAB — POCT I-STAT 7, (LYTES, BLD GAS, ICA,H+H)
Acid-Base Excess: 2 mmol/L (ref 0.0–2.0)
Acid-base deficit: 2 mmol/L (ref 0.0–2.0)
Acid-base deficit: 5 mmol/L — ABNORMAL HIGH (ref 0.0–2.0)
Acid-base deficit: 6 mmol/L — ABNORMAL HIGH (ref 0.0–2.0)
Acid-base deficit: 6 mmol/L — ABNORMAL HIGH (ref 0.0–2.0)
Bicarbonate: 26.3 mmol/L (ref 20.0–28.0)
Bicarbonate: 24.1 mmol/L (ref 20.0–28.0)
Bicarbonate: 21.3 mmol/L (ref 20.0–28.0)
Bicarbonate: 18.2 mmol/L — ABNORMAL LOW (ref 20.0–28.0)
Bicarbonate: 18.6 mmol/L — ABNORMAL LOW (ref 20.0–28.0)
Calcium, Ion: 1.09 mmol/L — ABNORMAL LOW (ref 1.15–1.40)
Calcium, Ion: 1.03 mmol/L — ABNORMAL LOW (ref 1.15–1.40)
Calcium, Ion: 1.05 mmol/L — ABNORMAL LOW (ref 1.15–1.40)
Calcium, Ion: 1.05 mmol/L — ABNORMAL LOW (ref 1.15–1.40)
Calcium, Ion: 1.05 mmol/L — ABNORMAL LOW (ref 1.15–1.40)
HCT: 20 % — ABNORMAL LOW (ref 39.0–52.0)
HCT: 26 % — ABNORMAL LOW (ref 39.0–52.0)
HCT: 27 % — ABNORMAL LOW (ref 39.0–52.0)
HCT: 29 % — ABNORMAL LOW (ref 39.0–52.0)
HCT: 26 % — ABNORMAL LOW (ref 39.0–52.0)
Hemoglobin: 6.8 g/dL — CL (ref 13.0–17.0)
Hemoglobin: 8.8 g/dL — ABNORMAL LOW (ref 13.0–17.0)
Hemoglobin: 9.2 g/dL — ABNORMAL LOW (ref 13.0–17.0)
Hemoglobin: 9.9 g/dL — ABNORMAL LOW (ref 13.0–17.0)
Hemoglobin: 8.8 g/dL — ABNORMAL LOW (ref 13.0–17.0)
O2 Saturation: 100 %
O2 Saturation: 100 %
O2 Saturation: 96 %
O2 Saturation: 94 %
O2 Saturation: 92 %
Patient temperature: 35.5
Patient temperature: 36.7
Patient temperature: 37
Potassium: 4 mmol/L (ref 3.5–5.1)
Potassium: 4.7 mmol/L (ref 3.5–5.1)
Potassium: 4.3 mmol/L (ref 3.5–5.1)
Potassium: 4.1 mmol/L (ref 3.5–5.1)
Potassium: 4 mmol/L (ref 3.5–5.1)
Sodium: 145 mmol/L (ref 135–145)
Sodium: 143 mmol/L (ref 135–145)
Sodium: 145 mmol/L (ref 135–145)
Sodium: 145 mmol/L (ref 135–145)
Sodium: 146 mmol/L — ABNORMAL HIGH (ref 135–145)
TCO2: 28 mmol/L (ref 22–32)
TCO2: 26 mmol/L (ref 22–32)
TCO2: 23 mmol/L (ref 22–32)
TCO2: 19 mmol/L — ABNORMAL LOW (ref 22–32)
TCO2: 20 mmol/L — ABNORMAL LOW (ref 22–32)
pCO2 arterial: 41.8 mmHg (ref 32.0–48.0)
pCO2 arterial: 45.8 mmHg (ref 32.0–48.0)
pCO2 arterial: 40.4 mmHg (ref 32.0–48.0)
pCO2 arterial: 30.2 mmHg — ABNORMAL LOW (ref 32.0–48.0)
pCO2 arterial: 31 mmHg — ABNORMAL LOW (ref 32.0–48.0)
pH, Arterial: 7.407 (ref 7.350–7.450)
pH, Arterial: 7.329 — ABNORMAL LOW (ref 7.350–7.450)
pH, Arterial: 7.322 — ABNORMAL LOW (ref 7.350–7.450)
pH, Arterial: 7.386 (ref 7.350–7.450)
pH, Arterial: 7.387 (ref 7.350–7.450)
pO2, Arterial: 370 mmHg — ABNORMAL HIGH (ref 83.0–108.0)
pO2, Arterial: 225 mmHg — ABNORMAL HIGH (ref 83.0–108.0)
pO2, Arterial: 85 mmHg (ref 83.0–108.0)
pO2, Arterial: 70 mmHg — ABNORMAL LOW (ref 83.0–108.0)
pO2, Arterial: 64 mmHg — ABNORMAL LOW (ref 83.0–108.0)

## 2019-05-21 LAB — POCT I-STAT 4, (NA,K, GLUC, HGB,HCT)
Glucose, Bld: 91 mg/dL (ref 70–99)
Glucose, Bld: 90 mg/dL (ref 70–99)
Glucose, Bld: 96 mg/dL (ref 70–99)
Glucose, Bld: 98 mg/dL (ref 70–99)
Glucose, Bld: 90 mg/dL (ref 70–99)
Glucose, Bld: 101 mg/dL — ABNORMAL HIGH (ref 70–99)
Glucose, Bld: 110 mg/dL — ABNORMAL HIGH (ref 70–99)
HCT: 28 % — ABNORMAL LOW (ref 39.0–52.0)
HCT: 21 % — ABNORMAL LOW (ref 39.0–52.0)
HCT: 23 % — ABNORMAL LOW (ref 39.0–52.0)
HCT: 26 % — ABNORMAL LOW (ref 39.0–52.0)
HCT: 20 % — ABNORMAL LOW (ref 39.0–52.0)
HCT: 21 % — ABNORMAL LOW (ref 39.0–52.0)
HCT: 24 % — ABNORMAL LOW (ref 39.0–52.0)
Hemoglobin: 9.5 g/dL — ABNORMAL LOW (ref 13.0–17.0)
Hemoglobin: 7.1 g/dL — ABNORMAL LOW (ref 13.0–17.0)
Hemoglobin: 7.8 g/dL — ABNORMAL LOW (ref 13.0–17.0)
Hemoglobin: 8.8 g/dL — ABNORMAL LOW (ref 13.0–17.0)
Hemoglobin: 6.8 g/dL — CL (ref 13.0–17.0)
Hemoglobin: 7.1 g/dL — ABNORMAL LOW (ref 13.0–17.0)
Hemoglobin: 8.2 g/dL — ABNORMAL LOW (ref 13.0–17.0)
Potassium: 3.5 mmol/L (ref 3.5–5.1)
Potassium: 3.6 mmol/L (ref 3.5–5.1)
Potassium: 3.9 mmol/L (ref 3.5–5.1)
Potassium: 4.3 mmol/L (ref 3.5–5.1)
Potassium: 4.4 mmol/L (ref 3.5–5.1)
Potassium: 5 mmol/L (ref 3.5–5.1)
Potassium: 4.6 mmol/L (ref 3.5–5.1)
Sodium: 144 mmol/L (ref 135–145)
Sodium: 143 mmol/L (ref 135–145)
Sodium: 143 mmol/L (ref 135–145)
Sodium: 144 mmol/L (ref 135–145)
Sodium: 141 mmol/L (ref 135–145)
Sodium: 142 mmol/L (ref 135–145)
Sodium: 143 mmol/L (ref 135–145)

## 2019-05-21 LAB — HEMOGLOBIN AND HEMATOCRIT, BLOOD
HCT: 20.8 % — ABNORMAL LOW (ref 39.0–52.0)
Hemoglobin: 6.9 g/dL — CL (ref 13.0–17.0)

## 2019-05-21 LAB — PLATELET COUNT: Platelets: 123 10*3/uL — ABNORMAL LOW (ref 150–400)

## 2019-05-21 LAB — BASIC METABOLIC PANEL
Anion gap: 12 (ref 5–15)
BUN: 42 mg/dL — ABNORMAL HIGH (ref 8–23)
CO2: 25 mmol/L (ref 22–32)
Calcium: 9.6 mg/dL (ref 8.9–10.3)
Chloride: 107 mmol/L (ref 98–111)
Creatinine, Ser: 1.21 mg/dL (ref 0.61–1.24)
GFR calc Af Amer: >60 mL/min (ref >60)
GFR calc non Af Amer: >60 mL/min (ref >60)
Glucose, Bld: 86 mg/dL (ref 70–99)
Potassium: 3.8 mmol/L (ref 3.5–5.1)
Sodium: 144 mmol/L (ref 135–145)

## 2019-05-21 LAB — CBC
HCT: 30.4 % — ABNORMAL LOW (ref 39.0–52.0)
HCT: 27.4 % — ABNORMAL LOW (ref 39.0–52.0)
Hemoglobin: 9.9 g/dL — ABNORMAL LOW (ref 13.0–17.0)
Hemoglobin: 9.1 g/dL — ABNORMAL LOW (ref 13.0–17.0)
MCH: 31.4 pg (ref 26.0–34.0)
MCH: 31.6 pg (ref 26.0–34.0)
MCHC: 32.6 g/dL (ref 30.0–36.0)
MCHC: 33.2 g/dL (ref 30.0–36.0)
MCV: 96.5 fL (ref 80.0–100.0)
MCV: 95.1 fL (ref 80.0–100.0)
Platelets: 178 10*3/uL (ref 150–400)
Platelets: 109 10*3/uL — ABNORMAL LOW (ref 150–400)
RBC: 3.15 MIL/uL — ABNORMAL LOW (ref 4.22–5.81)
RBC: 2.88 MIL/uL — ABNORMAL LOW (ref 4.22–5.81)
RDW: 16.1 % — ABNORMAL HIGH (ref 11.5–15.5)
RDW: 16.4 % — ABNORMAL HIGH (ref 11.5–15.5)
WBC: 8.2 10*3/uL (ref 4.0–10.5)
WBC: 11.3 10*3/uL — ABNORMAL HIGH (ref 4.0–10.5)
nRBC: 0 % (ref 0.0–0.2)
nRBC: 0 % (ref 0.0–0.2)

## 2019-05-21 LAB — GLUCOSE, CAPILLARY
Glucose-Capillary: 123 mg/dL — ABNORMAL HIGH (ref 70–99)
Glucose-Capillary: 130 mg/dL — ABNORMAL HIGH (ref 70–99)
Glucose-Capillary: 130 mg/dL — ABNORMAL HIGH (ref 70–99)
Glucose-Capillary: 131 mg/dL — ABNORMAL HIGH (ref 70–99)
Glucose-Capillary: 135 mg/dL — ABNORMAL HIGH (ref 70–99)
Glucose-Capillary: 138 mg/dL — ABNORMAL HIGH (ref 70–99)
Glucose-Capillary: 141 mg/dL — ABNORMAL HIGH (ref 70–99)
Glucose-Capillary: 156 mg/dL — ABNORMAL HIGH (ref 70–99)
Glucose-Capillary: 180 mg/dL — ABNORMAL HIGH (ref 70–99)

## 2019-05-21 LAB — ECHO INTRAOPERATIVE TEE
Height: 65 in
Weight: 2060.8 oz

## 2019-05-21 LAB — COOXEMETRY PANEL
Carboxyhemoglobin: 1.1 % (ref 0.5–1.5)
Methemoglobin: 0.9 % (ref 0.0–1.5)
O2 Saturation: 42.7 %
Total hemoglobin: 8.3 g/dL — ABNORMAL LOW (ref 12.0–16.0)

## 2019-05-21 LAB — PREPARE RBC (CROSSMATCH)

## 2019-05-21 LAB — PROTIME-INR
INR: 1.7 — ABNORMAL HIGH (ref 0.8–1.2)
Prothrombin Time: 19.7 seconds — ABNORMAL HIGH (ref 11.4–15.2)

## 2019-05-21 LAB — APTT: aPTT: 43 seconds — ABNORMAL HIGH (ref 24–36)

## 2019-05-21 SURGERY — CORONARY ARTERY BYPASS GRAFTING (CABG)
Anesthesia: General | Site: Chest

## 2019-05-21 MED ORDER — POTASSIUM CHLORIDE 10 MEQ/50ML IV SOLN: 10.0000 meq | INTRAVENOUS | Status: AC

## 2019-05-21 MED ORDER — CHLORHEXIDINE GLUCONATE 0.12 % MT SOLN
15.0000 mL | OROMUCOSAL | Status: AC
  Administered 2019-05-21: 15 mL via OROMUCOSAL

## 2019-05-21 MED ORDER — ATORVASTATIN CALCIUM 80 MG PO TABS: 80.0000 mg | ORAL_TABLET | Freq: Every day | ORAL | Status: DC

## 2019-05-21 MED ORDER — DEXMEDETOMIDINE HCL IN NACL 400 MCG/100ML IV SOLN
INTRAVENOUS | Status: DC | PRN
  Administered 2019-05-21: .5 ug/kg/h via INTRAVENOUS

## 2019-05-21 MED ORDER — ASPIRIN 81 MG PO CHEW: 324.0000 mg | CHEWABLE_TABLET | Freq: Every day | ORAL | Status: DC

## 2019-05-21 MED ORDER — ESMOLOL HCL 100 MG/10ML IV SOLN
INTRAVENOUS | Status: AC
  Filled 2019-05-21: qty 10

## 2019-05-21 MED ORDER — LIDOCAINE 2% (20 MG/ML) 5 ML SYRINGE
INTRAMUSCULAR | Status: AC
  Filled 2019-05-21: qty 5

## 2019-05-21 MED ORDER — CHLORHEXIDINE GLUCONATE CLOTH 2 % EX PADS
6.0000 | MEDICATED_PAD | Freq: Every day | CUTANEOUS | Status: DC
  Administered 2019-05-21 – 2019-05-23 (×3): 6 via TOPICAL

## 2019-05-21 MED ORDER — HEMOSTATIC AGENTS (NO CHARGE) OPTIME
TOPICAL | Status: DC | PRN
  Administered 2019-05-21 (×2): 1 via TOPICAL

## 2019-05-21 MED ORDER — FAMOTIDINE IN NACL 20-0.9 MG/50ML-% IV SOLN
20.0000 mg | Freq: Two times a day (BID) | INTRAVENOUS | Status: AC
  Administered 2019-05-21 – 2019-05-22 (×2): 20 mg via INTRAVENOUS
  Filled 2019-05-21: qty 50

## 2019-05-21 MED ORDER — ALBUMIN HUMAN 5 % IV SOLN
250.0000 mL | INTRAVENOUS | Status: AC | PRN
  Administered 2019-05-21 (×4): 12.5 g via INTRAVENOUS
  Filled 2019-05-21 (×2): qty 250

## 2019-05-21 MED ORDER — BISACODYL 10 MG RE SUPP: 10.0000 mg | Freq: Every day | RECTAL | Status: DC

## 2019-05-21 MED ORDER — MIDAZOLAM HCL 5 MG/5ML IJ SOLN
INTRAMUSCULAR | Status: DC | PRN
  Administered 2019-05-21: 4 mg via INTRAVENOUS
  Administered 2019-05-21: 1 mg via INTRAVENOUS

## 2019-05-21 MED ORDER — SODIUM CHLORIDE 0.9% FLUSH: 3.0000 mL | INTRAVENOUS | Status: DC | PRN

## 2019-05-21 MED ORDER — LACTATED RINGERS IV SOLN
INTRAVENOUS | Status: DC | PRN
  Administered 2019-05-21: 07:00:00 via INTRAVENOUS

## 2019-05-21 MED ORDER — PANTOPRAZOLE SODIUM 40 MG PO TBEC
40.0000 mg | DELAYED_RELEASE_TABLET | Freq: Every day | ORAL | Status: DC
  Administered 2019-05-23 – 2019-05-27 (×5): 40 mg via ORAL
  Filled 2019-05-21 (×5): qty 1

## 2019-05-21 MED ORDER — ROCURONIUM BROMIDE 10 MG/ML (PF) SYRINGE
PREFILLED_SYRINGE | INTRAVENOUS | Status: DC | PRN
  Administered 2019-05-21: 50 mg via INTRAVENOUS
  Administered 2019-05-21: 20 mg via INTRAVENOUS
  Administered 2019-05-21: 30 mg via INTRAVENOUS
  Administered 2019-05-21: 50 mg via INTRAVENOUS

## 2019-05-21 MED ORDER — ONDANSETRON HCL 4 MG/2ML IJ SOLN
INTRAMUSCULAR | Status: AC
  Filled 2019-05-21: qty 2

## 2019-05-21 MED ORDER — SODIUM CHLORIDE (PF) 0.9 % IJ SOLN
OROMUCOSAL | Status: DC | PRN
  Administered 2019-05-21 (×3): 4 mL via TOPICAL

## 2019-05-21 MED ORDER — FENTANYL CITRATE (PF) 250 MCG/5ML IJ SOLN
INTRAMUSCULAR | Status: DC | PRN
  Administered 2019-05-21: 100 ug via INTRAVENOUS
  Administered 2019-05-21: 200 ug via INTRAVENOUS
  Administered 2019-05-21: 25 ug via INTRAVENOUS
  Administered 2019-05-21: 75 ug via INTRAVENOUS
  Administered 2019-05-21: 100 ug via INTRAVENOUS

## 2019-05-21 MED ORDER — SODIUM CHLORIDE 0.9 % IV SOLN
INTRAVENOUS | Status: DC | PRN
  Administered 2019-05-21: 750 mg via INTRAVENOUS

## 2019-05-21 MED ORDER — NITROGLYCERIN IN D5W 200-5 MCG/ML-% IV SOLN: 0.0000 ug/min | INTRAVENOUS | Status: DC

## 2019-05-21 MED ORDER — MAGNESIUM SULFATE 4 GM/100ML IV SOLN
4.0000 g | Freq: Once | INTRAVENOUS | Status: AC
  Administered 2019-05-21: 4 g via INTRAVENOUS
  Filled 2019-05-21: qty 100

## 2019-05-21 MED ORDER — EPHEDRINE SULFATE-NACL 50-0.9 MG/10ML-% IV SOSY
PREFILLED_SYRINGE | INTRAVENOUS | Status: DC | PRN
  Administered 2019-05-21 (×3): 5 mg via INTRAVENOUS
  Administered 2019-05-21: 55 mg via INTRAVENOUS
  Administered 2019-05-21 (×3): 5 mg via INTRAVENOUS

## 2019-05-21 MED ORDER — SODIUM CHLORIDE 0.9 % IV SOLN: 250.0000 mL | INTRAVENOUS | Status: DC

## 2019-05-21 MED ORDER — SODIUM CHLORIDE 0.9 % IV SOLN
INTRAVENOUS | Status: DC | PRN
  Administered 2019-05-21: 08:00:00 20 ug/min via INTRAVENOUS

## 2019-05-21 MED ORDER — MILRINONE LACTATE IN DEXTROSE 20-5 MG/100ML-% IV SOLN
0.0000 ug/kg/min | INTRAVENOUS | Status: DC
  Administered 2019-05-21: 0.3 ug/kg/min via INTRAVENOUS
  Filled 2019-05-21 (×2): qty 100

## 2019-05-21 MED ORDER — ONDANSETRON HCL 4 MG/2ML IJ SOLN
4.0000 mg | Freq: Four times a day (QID) | INTRAMUSCULAR | Status: DC | PRN
  Administered 2019-05-23: 18:00:00 4 mg via INTRAVENOUS
  Filled 2019-05-21: qty 2

## 2019-05-21 MED ORDER — DEXMEDETOMIDINE HCL IN NACL 400 MCG/100ML IV SOLN
0.0000 ug/kg/h | INTRAVENOUS | Status: DC
  Administered 2019-05-21: 0.7 ug/kg/h via INTRAVENOUS
  Filled 2019-05-21: qty 100

## 2019-05-21 MED ORDER — ASPIRIN EC 325 MG PO TBEC
325.0000 mg | DELAYED_RELEASE_TABLET | Freq: Every day | ORAL | Status: DC
  Administered 2019-05-22 – 2019-05-27 (×6): 325 mg via ORAL
  Filled 2019-05-21 (×6): qty 1

## 2019-05-21 MED ORDER — ACETAMINOPHEN 650 MG RE SUPP
650.0000 mg | Freq: Once | RECTAL | Status: AC
  Administered 2019-05-21: 16:00:00 650 mg via RECTAL

## 2019-05-21 MED ORDER — LACTATED RINGERS IV SOLN
INTRAVENOUS | Status: DC
  Administered 2019-05-22: 15:00:00 via INTRAVENOUS

## 2019-05-21 MED ORDER — BISACODYL 5 MG PO TBEC
10.0000 mg | DELAYED_RELEASE_TABLET | Freq: Every day | ORAL | Status: DC
  Administered 2019-05-22 – 2019-05-26 (×5): 10 mg via ORAL
  Filled 2019-05-21 (×5): qty 2

## 2019-05-21 MED ORDER — LACTATED RINGERS IV SOLN
500.0000 mL | Freq: Once | INTRAVENOUS | Status: AC | PRN
  Administered 2019-05-21: 500 mL via INTRAVENOUS

## 2019-05-21 MED ORDER — OXYCODONE HCL 5 MG PO TABS: 5.0000 mg | ORAL_TABLET | ORAL | Status: DC | PRN

## 2019-05-21 MED ORDER — ACETAMINOPHEN 160 MG/5ML PO SOLN: 650.0000 mg | Freq: Once | ORAL | Status: AC

## 2019-05-21 MED ORDER — SODIUM CHLORIDE 0.45 % IV SOLN
INTRAVENOUS | Status: DC | PRN
  Administered 2019-05-21: 16:00:00 via INTRAVENOUS

## 2019-05-21 MED ORDER — ROCURONIUM BROMIDE 10 MG/ML (PF) SYRINGE
PREFILLED_SYRINGE | INTRAVENOUS | Status: AC
  Filled 2019-05-21: qty 10

## 2019-05-21 MED ORDER — PHENYLEPHRINE 40 MCG/ML (10ML) SYRINGE FOR IV PUSH (FOR BLOOD PRESSURE SUPPORT)
PREFILLED_SYRINGE | INTRAVENOUS | Status: DC | PRN
  Administered 2019-05-21 (×2): 80 ug via INTRAVENOUS
  Administered 2019-05-21 (×2): 40 ug via INTRAVENOUS
  Administered 2019-05-21: 80 ug via INTRAVENOUS
  Administered 2019-05-21: 120 ug via INTRAVENOUS
  Administered 2019-05-21 (×2): 40 ug via INTRAVENOUS
  Administered 2019-05-21 (×3): 80 ug via INTRAVENOUS
  Administered 2019-05-21: 40 ug via INTRAVENOUS

## 2019-05-21 MED ORDER — SODIUM CHLORIDE 0.9 % IV SOLN
INTRAVENOUS | Status: DC
  Administered 2019-05-21: 15:00:00 via INTRAVENOUS

## 2019-05-21 MED ORDER — SODIUM CHLORIDE 0.9 % IV SOLN
1.5000 g | Freq: Two times a day (BID) | INTRAVENOUS | Status: AC
  Administered 2019-05-21 – 2019-05-23 (×4): 1.5 g via INTRAVENOUS
  Filled 2019-05-21 (×4): qty 1.5

## 2019-05-21 MED ORDER — SODIUM CHLORIDE 0.9 % IR SOLN
Status: DC | PRN
  Administered 2019-05-21: 5000 mL

## 2019-05-21 MED ORDER — INSULIN REGULAR(HUMAN) IN NACL 100-0.9 UT/100ML-% IV SOLN: INTRAVENOUS | Status: DC

## 2019-05-21 MED ORDER — SODIUM CHLORIDE 0.9% FLUSH
10.0000 mL | Freq: Two times a day (BID) | INTRAVENOUS | Status: DC
  Administered 2019-05-21: 22:00:00 10 mL
  Administered 2019-05-22: 20 mL
  Administered 2019-05-22 – 2019-05-26 (×4): 10 mL

## 2019-05-21 MED ORDER — MIDAZOLAM HCL (PF) 10 MG/2ML IJ SOLN
INTRAMUSCULAR | Status: AC
  Filled 2019-05-21: qty 2

## 2019-05-21 MED ORDER — PHENYLEPHRINE HCL-NACL 20-0.9 MG/250ML-% IV SOLN
0.0000 ug/min | INTRAVENOUS | Status: DC
  Administered 2019-05-22: 01:00:00 15 ug/min via INTRAVENOUS
  Filled 2019-05-21 (×2): qty 250

## 2019-05-21 MED ORDER — FENTANYL CITRATE (PF) 250 MCG/5ML IJ SOLN
INTRAMUSCULAR | Status: AC
  Filled 2019-05-21: qty 5

## 2019-05-21 MED ORDER — SODIUM CHLORIDE 0.9% FLUSH
3.0000 mL | Freq: Two times a day (BID) | INTRAVENOUS | Status: DC
  Administered 2019-05-22 – 2019-05-23 (×3): 3 mL via INTRAVENOUS

## 2019-05-21 MED ORDER — SODIUM CHLORIDE 0.9 % IV SOLN
INTRAVENOUS | Status: DC | PRN
  Administered 2019-05-21: 13:00:00 via INTRAVENOUS

## 2019-05-21 MED ORDER — PROPOFOL 10 MG/ML IV BOLUS
INTRAVENOUS | Status: AC
  Filled 2019-05-21: qty 20

## 2019-05-21 MED ORDER — ORAL CARE MOUTH RINSE
15.0000 mL | OROMUCOSAL | Status: DC
  Administered 2019-05-21 (×2): 15 mL via OROMUCOSAL

## 2019-05-21 MED ORDER — DOCUSATE SODIUM 100 MG PO CAPS
200.0000 mg | ORAL_CAPSULE | Freq: Every day | ORAL | Status: DC
  Administered 2019-05-22 – 2019-05-27 (×6): 200 mg via ORAL
  Filled 2019-05-21 (×6): qty 2

## 2019-05-21 MED ORDER — SODIUM CHLORIDE 0.9 % IV SOLN
INTRAVENOUS | Status: DC | PRN
  Administered 2019-05-21: .5 [IU]/h via INTRAVENOUS

## 2019-05-21 MED ORDER — VANCOMYCIN HCL IN DEXTROSE 1-5 GM/200ML-% IV SOLN
1000.0000 mg | Freq: Once | INTRAVENOUS | Status: AC
  Administered 2019-05-21: 22:00:00 1000 mg via INTRAVENOUS
  Filled 2019-05-21: qty 200

## 2019-05-21 MED ORDER — MORPHINE SULFATE (PF) 2 MG/ML IV SOLN: 1.0000 mg | INTRAVENOUS | Status: DC | PRN

## 2019-05-21 MED ORDER — INSULIN REGULAR BOLUS VIA INFUSION
0.0000 [IU] | Freq: Three times a day (TID) | INTRAVENOUS | Status: DC
  Filled 2019-05-21: qty 10

## 2019-05-21 MED ORDER — CHLORHEXIDINE GLUCONATE 0.12% ORAL RINSE (MEDLINE KIT)
15.0000 mL | Freq: Two times a day (BID) | OROMUCOSAL | Status: DC
  Administered 2019-05-21 – 2019-05-26 (×7): 15 mL via OROMUCOSAL

## 2019-05-21 MED ORDER — HEPARIN SODIUM (PORCINE) 1000 UNIT/ML IJ SOLN
INTRAMUSCULAR | Status: DC | PRN
  Administered 2019-05-21: 3000 [IU] via INTRAVENOUS
  Administered 2019-05-21: 19000 [IU] via INTRAVENOUS

## 2019-05-21 MED ORDER — METOPROLOL TARTRATE 5 MG/5ML IV SOLN
2.5000 mg | INTRAVENOUS | Status: DC | PRN
  Administered 2019-05-21: 5 mg via INTRAVENOUS
  Filled 2019-05-21: qty 5

## 2019-05-21 MED ORDER — ACETAMINOPHEN 160 MG/5ML PO SOLN: 1000.0000 mg | Freq: Four times a day (QID) | ORAL | Status: DC

## 2019-05-21 MED ORDER — SODIUM CHLORIDE 0.9% FLUSH: 10.0000 mL | INTRAVENOUS | Status: DC | PRN

## 2019-05-21 MED ORDER — MIDAZOLAM HCL 2 MG/2ML IJ SOLN: 2.0000 mg | INTRAMUSCULAR | Status: DC | PRN

## 2019-05-21 MED ORDER — SODIUM CHLORIDE 0.9 % IV SOLN
INTRAVENOUS | Status: DC
  Administered 2019-05-21 – 2019-05-22 (×2): via INTRAVENOUS

## 2019-05-21 MED ORDER — HEPARIN SODIUM (PORCINE) 1000 UNIT/ML IJ SOLN
INTRAMUSCULAR | Status: AC
  Filled 2019-05-21: qty 1

## 2019-05-21 MED ORDER — ORAL CARE MOUTH RINSE
15.0000 mL | Freq: Two times a day (BID) | OROMUCOSAL | Status: DC
  Administered 2019-05-22 – 2019-05-27 (×8): 15 mL via OROMUCOSAL

## 2019-05-21 MED ORDER — ALBUMIN HUMAN 5 % IV SOLN
INTRAVENOUS | Status: DC | PRN
  Administered 2019-05-21 (×2): via INTRAVENOUS

## 2019-05-21 MED ORDER — TRANEXAMIC ACID 1000 MG/10ML IV SOLN
INTRAVENOUS | Status: DC | PRN
  Administered 2019-05-21: 1.5 mg/kg/h via INTRAVENOUS

## 2019-05-21 MED ORDER — TRAMADOL HCL 50 MG PO TABS: 50.0000 mg | ORAL_TABLET | ORAL | Status: DC | PRN

## 2019-05-21 MED ORDER — LACTATED RINGERS IV SOLN: INTRAVENOUS | Status: DC

## 2019-05-21 MED ORDER — PROPOFOL 10 MG/ML IV BOLUS
INTRAVENOUS | Status: DC | PRN
  Administered 2019-05-21 (×2): 50 mg via INTRAVENOUS
  Administered 2019-05-21: 30 mg via INTRAVENOUS

## 2019-05-21 MED ORDER — PROTAMINE SULFATE 10 MG/ML IV SOLN
INTRAVENOUS | Status: DC | PRN
  Administered 2019-05-21: 190 mg via INTRAVENOUS
  Administered 2019-05-21: 30 mg via INTRAVENOUS

## 2019-05-21 MED ORDER — ACETAMINOPHEN 500 MG PO TABS
1000.0000 mg | ORAL_TABLET | Freq: Four times a day (QID) | ORAL | Status: AC
  Administered 2019-05-22 – 2019-05-26 (×17): 1000 mg via ORAL
  Filled 2019-05-21 (×17): qty 2

## 2019-05-21 MED ORDER — LEVOTHYROXINE SODIUM 100 MCG PO TABS
100.0000 ug | ORAL_TABLET | Freq: Every day | ORAL | Status: DC
  Administered 2019-05-22 – 2019-05-27 (×6): 100 ug via ORAL
  Filled 2019-05-21 (×6): qty 1

## 2019-05-21 MED ORDER — SODIUM CHLORIDE 0.9% IV SOLUTION: Freq: Once | INTRAVENOUS | Status: AC

## 2019-05-21 SURGICAL SUPPLY — 140 items
ADAPTER CARDIO PERF ANTE/RETRO (ADAPTER) ×3 IMPLANT
BAG DECANTER FOR FLEXI CONT (MISCELLANEOUS) ×6 IMPLANT
BANDAGE ELASTIC 4 VELCRO ST LF (GAUZE/BANDAGES/DRESSINGS) ×3 IMPLANT
BANDAGE ELASTIC 6 VELCRO ST LF (GAUZE/BANDAGES/DRESSINGS) ×3 IMPLANT
BASKET HEART (ORDER IN 25'S) (MISCELLANEOUS) ×1
BASKET HEART (ORDER IN 25S) (MISCELLANEOUS) ×2 IMPLANT
BLADE CLIPPER SURG (BLADE) IMPLANT
BLADE STERNUM SYSTEM 6 (BLADE) ×3 IMPLANT
BLADE SURG 11 STRL SS (BLADE) ×6 IMPLANT
BNDG ELASTIC 4X5.8 VLCR STR LF (GAUZE/BANDAGES/DRESSINGS) ×3 IMPLANT
BNDG ELASTIC 6X5.8 VLCR STR LF (GAUZE/BANDAGES/DRESSINGS) ×3 IMPLANT
BNDG GAUZE ELAST 4 BULKY (GAUZE/BANDAGES/DRESSINGS) ×3 IMPLANT
CANISTER SUCT 3000ML PPV (MISCELLANEOUS) ×3 IMPLANT
CANNULA EZ GLIDE AORTIC 21FR (CANNULA) ×3 IMPLANT
CANNULA GUNDRY RCSP 15FR (MISCELLANEOUS) ×3 IMPLANT
CANNULA SOFTFLOW AORTIC 7M21FR (CANNULA) ×3 IMPLANT
CATH CPB KIT OWEN (MISCELLANEOUS) ×3 IMPLANT
CATH HEART VENT LEFT (CATHETERS) ×2 IMPLANT
CATH THORACIC 36FR (CATHETERS) ×3 IMPLANT
CATH THORACIC 36FR RT ANG (CATHETERS) ×3 IMPLANT
CLIP RETRACTION 3.0MM CORONARY (MISCELLANEOUS) ×6 IMPLANT
CLIP VESOCCLUDE MED 24/CT (CLIP) IMPLANT
CLIP VESOCCLUDE SM WIDE 24/CT (CLIP) IMPLANT
CONT SPEC 4OZ CLIKSEAL STRL BL (MISCELLANEOUS) ×3 IMPLANT
COVER SURGICAL LIGHT HANDLE (MISCELLANEOUS) ×3 IMPLANT
COVER WAND RF STERILE (DRAPES) ×3 IMPLANT
DERMABOND ADVANCED (GAUZE/BANDAGES/DRESSINGS) ×1
DERMABOND ADVANCED .7 DNX12 (GAUZE/BANDAGES/DRESSINGS) ×2 IMPLANT
DEVICE SUT CK QUICK LOAD MINI (Prosthesis & Implant Heart) ×6 IMPLANT
DRAIN CHANNEL 32F RND 10.7 FF (WOUND CARE) ×6 IMPLANT
DRAPE CARDIOVASCULAR INCISE (DRAPES) ×1
DRAPE CV SPLIT W-CLR ANES SCRN (DRAPES) IMPLANT
DRAPE INCISE IOBAN 66X45 STRL (DRAPES) ×6 IMPLANT
DRAPE PERI GROIN 82X75IN TIB (DRAPES) IMPLANT
DRAPE SLUSH/WARMER DISC (DRAPES) ×3 IMPLANT
DRAPE SRG 135X102X78XABS (DRAPES) ×2 IMPLANT
DRSG AQUACEL AG ADV 3.5X14 (GAUZE/BANDAGES/DRESSINGS) ×3 IMPLANT
DRSG COVADERM 4X14 (GAUZE/BANDAGES/DRESSINGS) ×3 IMPLANT
DRSG XEROFORM 1X8 (GAUZE/BANDAGES/DRESSINGS) ×3 IMPLANT
ELECT BLADE 4.0 EZ CLEAN MEGAD (MISCELLANEOUS) ×3
ELECT REM PT RETURN 9FT ADLT (ELECTROSURGICAL) ×6
ELECTRODE BLDE 4.0 EZ CLN MEGD (MISCELLANEOUS) ×2 IMPLANT
ELECTRODE REM PT RTRN 9FT ADLT (ELECTROSURGICAL) ×4 IMPLANT
FELT TEFLON 1X6 (MISCELLANEOUS) ×6 IMPLANT
GAUZE SPONGE 4X4 12PLY STRL (GAUZE/BANDAGES/DRESSINGS) ×6 IMPLANT
GAUZE XEROFORM 1X8 LF (GAUZE/BANDAGES/DRESSINGS) ×3 IMPLANT
GLOVE BIO SURGEON STRL SZ 6 (GLOVE) IMPLANT
GLOVE BIO SURGEON STRL SZ 6.5 (GLOVE) IMPLANT
GLOVE BIO SURGEON STRL SZ7 (GLOVE) ×3 IMPLANT
GLOVE BIO SURGEON STRL SZ7.5 (GLOVE) ×6 IMPLANT
GLOVE BIOGEL PI IND STRL 6 (GLOVE) ×2 IMPLANT
GLOVE BIOGEL PI IND STRL 7.0 (GLOVE) ×2 IMPLANT
GLOVE BIOGEL PI IND STRL 7.5 (GLOVE) ×2 IMPLANT
GLOVE BIOGEL PI INDICATOR 6 (GLOVE) ×1
GLOVE BIOGEL PI INDICATOR 7.0 (GLOVE) ×1
GLOVE BIOGEL PI INDICATOR 7.5 (GLOVE) ×1
GLOVE ORTHO TXT STRL SZ7.5 (GLOVE) ×9 IMPLANT
GOWN STRL REUS W/ TWL LRG LVL3 (GOWN DISPOSABLE) ×14 IMPLANT
GOWN STRL REUS W/TWL LRG LVL3 (GOWN DISPOSABLE) ×7
HEMOSTAT POWDER SURGIFOAM 1G (HEMOSTASIS) ×9 IMPLANT
INSERT FOGARTY XLG (MISCELLANEOUS) ×3 IMPLANT
IV NS IRRIG 3000ML ARTHROMATIC (IV SOLUTION) ×3 IMPLANT
KIT BASIN OR (CUSTOM PROCEDURE TRAY) ×3 IMPLANT
KIT SUCTION CATH 14FR (SUCTIONS) ×9 IMPLANT
KIT SUT CK MINI COMBO 4X17 (Prosthesis & Implant Heart) ×3 IMPLANT
KIT TURNOVER KIT B (KITS) ×3 IMPLANT
KIT VASOVIEW HEMOPRO 2 VH 4000 (KITS) ×3 IMPLANT
LEAD PACING MYOCARDI (MISCELLANEOUS) ×3 IMPLANT
LINE VENT (MISCELLANEOUS) ×3 IMPLANT
MARKER GRAFT CORONARY BYPASS (MISCELLANEOUS) ×9 IMPLANT
NS IRRIG 1000ML POUR BTL (IV SOLUTION) ×15 IMPLANT
PACK E OPEN HEART (SUTURE) ×3 IMPLANT
PACK OPEN HEART (CUSTOM PROCEDURE TRAY) ×3 IMPLANT
PAD ARMBOARD 7.5X6 YLW CONV (MISCELLANEOUS) ×6 IMPLANT
PAD ELECT DEFIB RADIOL ZOLL (MISCELLANEOUS) ×3 IMPLANT
PENCIL BUTTON HOLSTER BLD 10FT (ELECTRODE) ×3 IMPLANT
POSITIONER HEAD DONUT 9IN (MISCELLANEOUS) ×3 IMPLANT
POWDER SURGICEL 3.0 GRAM (HEMOSTASIS) ×3 IMPLANT
PUNCH AORTIC ROT 4.0MM RCL 40 (MISCELLANEOUS) ×3 IMPLANT
PUNCH AORTIC ROTATE 4.0MM (MISCELLANEOUS) IMPLANT
PUNCH AORTIC ROTATE 4.5MM 8IN (MISCELLANEOUS) IMPLANT
PUNCH AORTIC ROTATE 5MM 8IN (MISCELLANEOUS) IMPLANT
SET CARDIOPLEGIA MPS 5001102 (MISCELLANEOUS) ×3 IMPLANT
SET IRRIG TUBING LAPAROSCOPIC (IRRIGATION / IRRIGATOR) ×3 IMPLANT
SOLUTION ANTI FOG 6CC (MISCELLANEOUS) ×3 IMPLANT
SPONGE LAP 18X18 RF (DISPOSABLE) ×3 IMPLANT
SPONGE LAP 4X18 RFD (DISPOSABLE) ×3 IMPLANT
SURGIFLO W/THROMBIN 8M KIT (HEMOSTASIS) ×3 IMPLANT
SUT BONE WAX W31G (SUTURE) ×3 IMPLANT
SUT ETHIBON 2 0 V 52N 30 (SUTURE) ×12 IMPLANT
SUT ETHIBON EXCEL 2-0 V-5 (SUTURE) IMPLANT
SUT ETHIBOND 2 0 SH (SUTURE)
SUT ETHIBOND 2 0 SH 36X2 (SUTURE) IMPLANT
SUT ETHIBOND 2 0 V4 (SUTURE) IMPLANT
SUT ETHIBOND 2 0V4 GREEN (SUTURE) IMPLANT
SUT ETHIBOND 4 0 RB 1 (SUTURE) IMPLANT
SUT ETHIBOND V-5 VALVE (SUTURE) IMPLANT
SUT ETHIBOND X763 2 0 SH 1 (SUTURE) ×9 IMPLANT
SUT MNCRL AB 3-0 PS2 18 (SUTURE) ×6 IMPLANT
SUT MNCRL AB 4-0 PS2 18 (SUTURE) ×3 IMPLANT
SUT PDS AB 1 CTX 36 (SUTURE) ×6 IMPLANT
SUT PROLENE 2 0 SH DA (SUTURE) IMPLANT
SUT PROLENE 3 0 SH DA (SUTURE) ×3 IMPLANT
SUT PROLENE 3 0 SH1 36 (SUTURE) ×12 IMPLANT
SUT PROLENE 4 0 RB 1 (SUTURE) ×3
SUT PROLENE 4 0 SH DA (SUTURE) ×3 IMPLANT
SUT PROLENE 4-0 RB1 .5 CRCL 36 (SUTURE) ×6 IMPLANT
SUT PROLENE 5 0 C 1 36 (SUTURE) IMPLANT
SUT PROLENE 6 0 C 1 30 (SUTURE) ×6 IMPLANT
SUT PROLENE 7.0 RB 3 (SUTURE) ×30 IMPLANT
SUT PROLENE 8 0 BV175 6 (SUTURE) ×3 IMPLANT
SUT PROLENE BLUE 7 0 (SUTURE) ×3 IMPLANT
SUT PROLENE POLY MONO (SUTURE) ×21 IMPLANT
SUT SILK  1 MH (SUTURE) ×1
SUT SILK 1 MH (SUTURE) ×2 IMPLANT
SUT SILK 2 0 SH CR/8 (SUTURE) IMPLANT
SUT SILK 3 0 SH CR/8 (SUTURE) IMPLANT
SUT STEEL 6MS V (SUTURE) ×3 IMPLANT
SUT STEEL STERNAL CCS#1 18IN (SUTURE) IMPLANT
SUT STEEL SZ 6 DBL 3X14 BALL (SUTURE) ×6 IMPLANT
SUT VIC AB 1 CTX 36 (SUTURE)
SUT VIC AB 1 CTX36XBRD ANBCTR (SUTURE) IMPLANT
SUT VIC AB 2-0 CT1 27 (SUTURE) ×1
SUT VIC AB 2-0 CT1 TAPERPNT 27 (SUTURE) ×2 IMPLANT
SUT VIC AB 2-0 CTX 27 (SUTURE) IMPLANT
SUT VIC AB 3-0 SH 27 (SUTURE)
SUT VIC AB 3-0 SH 27X BRD (SUTURE) IMPLANT
SUT VIC AB 3-0 X1 27 (SUTURE) IMPLANT
SUT VICRYL 4-0 PS2 18IN ABS (SUTURE) IMPLANT
SYSTEM SAHARA CHEST DRAIN ATS (WOUND CARE) ×3 IMPLANT
TAPE CLOTH SURG 4X10 WHT LF (GAUZE/BANDAGES/DRESSINGS) ×3 IMPLANT
TAPE PAPER 2X10 WHT MICROPORE (GAUZE/BANDAGES/DRESSINGS) ×3 IMPLANT
TOWEL GREEN STERILE (TOWEL DISPOSABLE) ×3 IMPLANT
TOWEL GREEN STERILE FF (TOWEL DISPOSABLE) ×3 IMPLANT
TRAY FOLEY SLVR 16FR TEMP STAT (SET/KITS/TRAYS/PACK) ×3 IMPLANT
TUBING LAP HI FLOW INSUFFLATIO (TUBING) ×3 IMPLANT
UNDERPAD 30X30 (UNDERPADS AND DIAPERS) ×3 IMPLANT
VALVE AORTIC SZ25 INSP/RESIL (Prosthesis & Implant Heart) ×3 IMPLANT
VENT LEFT HEART 12002 (CATHETERS) ×3
WATER STERILE IRR 1000ML POUR (IV SOLUTION) ×6 IMPLANT

## 2019-05-21 NOTE — Anesthesia Postprocedure Evaluation (Signed)
Anesthesia Post Note  Patient: Dennis Gomez  Procedure(s) Performed: CORONARY ARTERY BYPASS GRAFTING (CABG) x Four , using left internal mammary artery and right leg greater saphenous vein harvested endoscopically (N/A Chest) AORTIC VALVE REPLACEMENT (AVR) using Inspiris Valve size 38mm (N/A Chest) TRANSESOPHAGEAL ECHOCARDIOGRAM (TEE) (N/A )     Patient location during evaluation: SICU Anesthesia Type: General Level of consciousness: sedated Pain management: pain level controlled Vital Signs Assessment: post-procedure vital signs reviewed and stable Respiratory status: patient remains intubated per anesthesia plan Cardiovascular status: stable Postop Assessment: no apparent nausea or vomiting Anesthetic complications: no    Last Vitals:  Vitals:   05/21/19 1745 05/21/19 1800  BP:  109/80  Pulse: 70 70  Resp: 20 20  Temp: 36.5 C 36.8 C  SpO2: 98% 99%    Last Pain:  Vitals:   05/21/19 1530  TempSrc: Core  PainSc:                  Karyl Kinnier Ellender

## 2019-05-21 NOTE — Anesthesia Procedure Notes (Signed)
Arterial Line Insertion Start/End8/01/2019 6:55 AM, 05/21/2019 7:15 AM Performed by: Murvin Natal, MD, Milford Cage, CRNA, CRNA  Patient location: Pre-op. Preanesthetic checklist: patient identified, IV checked, site marked, risks and benefits discussed, surgical consent, monitors and equipment checked, pre-op evaluation, timeout performed and anesthesia consent Lidocaine 1% used for infiltration Left, radial was placed Catheter size: 20 G Hand hygiene performed , maximum sterile barriers used  and Seldinger technique used  Attempts: 3 Procedure performed without using ultrasound guided technique. Following insertion, dressing applied. Post procedure assessment: normal and unchanged  Patient tolerated the procedure well with no immediate complications.

## 2019-05-21 NOTE — Progress Notes (Signed)
      [address]       [place] [address]             [phone]     CARDIOTHORACIC SURGERY PROGRESS NOTE  Subjective: Dennis Gomez has been scheduled for Procedure(s): CORONARY ARTERY BYPASS GRAFTING (CABG) (N/A) AORTIC VALVE REPLACEMENT (AVR) (N/A) TRANSESOPHAGEAL ECHOCARDIOGRAM (TEE) (N/A) today.   Objective: Vital signs in last 24 hours: Temp:  [98.8 F (37.1 C)] 98.8 F (37.1 C) (08/04 0423) Pulse Rate:  [66] 66 (08/04 0423) Cardiac Rhythm: Normal sinus rhythm (08/03 2300) Resp:  [13-17] 13 (08/04 0423) BP: (119-162)/(43-53) 151/47 (08/04 0423) SpO2:  [100 %] 100 % (08/04 0423) Weight:  [58.4 kg] 58.4 kg (08/04 0423)  Physical Exam: Unchanged from previously   Intake/Output from previous day: 08/03 0701 - 08/04 0700 In: 909 [P.O.:906; I.V.:3] Out: 1050 [Urine:1050] Intake/Output this shift: Total I/O In: 243 [P.O.:240; I.V.:3] Out: 250 [Urine:250]  Lab Results: Recent Labs    05/19/19 0506 05/21/19 0425  WBC 7.0 8.2  HGB 10.3* 9.9*  HCT 31.1* 30.4*  PLT 181 178   BMET:  Recent Labs    05/19/19 0506 05/21/19 0425  NA 140 144  K 3.3* 3.8  CL 105 107  CO2 25 25  GLUCOSE 83 86  BUN 43* 42*  CREATININE 1.20 1.21  CALCIUM 9.5 9.6    CBG (last 3)  No results for input(s): GLUCAP in the last 72 hours. PT/INR:  No results for input(s): LABPROT, INR in the last 72 hours.  Assessment/Plan:   The various methods of treatment have been discussed with the patient. After consideration of the risks, benefits and treatment options the patient has consented to the planned procedure.   The patient has been seen and labs reviewed. There are no changes in the patient's condition to prevent proceeding with the planned procedure today.   Rexene Alberts, MD 05/21/2019 6:49 AM

## 2019-05-21 NOTE — Progress Notes (Addendum)
PROGRESS NOTE    Dennis Gomez  BPZ:[id number] DOB: 1951-03-17 DOA: 05/16/2019 PCP: Serita Grammes, MD    Brief Narrative:  [name] a67 y.o.male,w hypothyroidism, Copd, CHF (EF 35-40%), apparently admitted for chest pain on 05/14/2019 at [facility]. Chest pain w/up includedechocardiogram that revealed new systolic with combined diastolic congestive heart failure with an EF of 35-40% with moderate to severe aortic regurgitation seen. Cardiology consulted and recommended transfer to [facility] for cardiac catheterization and possible surgical consult foraortic regurgitation. Cath done with severe 3 vessel disease. 05/21/2019: Per CT surgery plan for aortic valve replacement and CABG today.   Assessment & Plan:  Principal Problem:   Aortic regurgitation Active Problems:   Hypothyroidism   COPD (chronic obstructive pulmonary disease) (HCC)   Chest pain   Acute kidney injury (HCC)   Chronic kidney disease (CKD), stage III (moderate) (HCC)   Coronary artery disease involving native coronary artery of native heart with angina pectoris (HCC)   Tobacco abuse   Severe aortic regurgitation   Chronic systolic congestive heart failure (HCC)   Essential hypertension   Hyperlipidemia LDL goal <70    CAD -cath shows severe multivessel disease and in need of CABG and valve repair.  -ASA/statin -no BB as HR low -CT surgery consultappreciated 05/21/2019: Per CT surgery plan for aortic valve replacement and CABG today.  Aortic regurgitation/Mitral regurgitation  -evaluated by cards: repeated echo, right and left heart cath.  -Given need for CABG will need AVR- planned. 05/21/2019: Plan for aortic valve replacement today.  Cardiomyopathy  -EF 45-50 now -Cont lisinopril 10mg  po qday -Cont hydralazine 10mg  po tid,   Acute kidney injuryvs CKD -baseline unknown -trending down 05/20/2019: Labs ordered for a.m. 05/21/2019: Creatinine 1.21 today.   Hypothyroidism Cont Levothyroxine at 150 micrograms po qday   Hyperlipidemia (LDL =174) Cont Lipitor 40mg  po qhs  Copd stable at baseline. Not on home oxygen -duoneb prn -Anoro 1puff qday -Albuterol neb q6h prn   Anemia- unknown (perhaps CKD) -iron only mildly low  Tobacco abuse Counseled on cessation  Code Status:full Family Communication: Plan of care discussed with patient.  Discussed yesterday with his wife. Disposition Plan:CABG/AVR today   Consultants:  CVTS  cards   Subjective: Denies any major complaints at this time.  Objective: Vitals:   05/21/19 1515 05/21/19 1530 05/21/19 1545 05/21/19 1600  BP:    111/85  Pulse: 80 70 70 70  Resp: 16 16 17 17   Temp: (!) 96.1 F (35.6 C) (!) 95.5 F (35.3 C) (!) 95.7 F (35.4 C) (!) 95.5 F (35.3 C)  TempSrc:      SpO2: 95% 97% 97% 99%  Weight:      Height:        Intake/Output Summary (Last 24 hours) at 05/21/2019 1615 Last data filed at 05/21/2019 1507 Gross per 24 hour  Intake 3785 ml  Output 1550 ml  Net 2235 ml   Filed Weights   05/18/19 0608 05/19/19 0553 05/21/19 0423  Weight: 56.9 kg 60.6 kg 58.4 kg    Examination:  General exam: Appears calm and comfortable  Respiratory system: Clear to auscultation. Respiratory effort normal. Cardiovascular system: S1 & S2 heard, murmur Gastrointestinal system: Abdomen is nondistended, soft and nontender. Normal bowel sounds heard. Central nervous system: Alert and oriented. No focal neurological deficits. Extremities: Symmetric 5 x 5 power.   Data Reviewed: I have personally reviewed following labs and imaging studies  CBC: Recent Labs  Lab 05/17/19 0436  05/18/19 0328 05/19/19 0506 05/21/19 0425  05/21/19 1204 05/21/19 1221 05/21/19 1339 05/21/19 1342 05/21/19 1530  WBC 6.7  --  8.2 7.0 8.2  --   --   --   --   --  11.3*  HGB 12.6*   < > 10.9* 10.3* 9.9*   < > 6.9* 7.1* 8.2* 8.8* 9.1*  HCT 38.2*  36.5*   < > 33.1* 31.1* 30.4*   < >  20.8* 21.0* 24.0* 26.0* 27.4*  MCV 94.1  --  95.1 94.0 96.5  --   --   --   --   --  95.1  PLT 231  --  185 181 178  --  123*  --   --   --  PENDING   < > = values in this interval not displayed.   Basic Metabolic Panel: Recent Labs  Lab 05/17/19 0436  05/18/19 0328 05/19/19 0506 05/21/19 0425  05/21/19 0958  05/21/19 1021 05/21/19 1121 05/21/19 1221 05/21/19 1339 05/21/19 1342  NA 139   < > 140 140 144   < > 144   < > 143 141 142 143 143  K 3.7   < > 3.7 3.3* 3.8   < > 3.9   < > 4.3 4.4 5.0 4.6 4.7  CL 100  --  102 105 107  --   --   --   --   --   --   --   --   CO2 27  --  25 25 25   --   --   --   --   --   --   --   --   GLUCOSE 83  --  82 83 86   < > 90  --  98 90 101* 110*  --   BUN 36*  --  42* 43* 42*  --   --   --   --   --   --   --   --   CREATININE 1.46*  --  1.37* 1.20 1.21  --   --   --   --   --   --   --   --   CALCIUM 10.1  --  9.5 9.5 9.6  --   --   --   --   --   --   --   --    < > = values in this interval not displayed.   GFR: Estimated Creatinine Clearance: 50.3 mL/min (by C-G formula based on SCr of 1.21 mg/dL). Liver Function Tests: Recent Labs  Lab 05/17/19 0436 05/18/19 0328  AST 27 24  ALT 14 11  ALKPHOS 65 59  BILITOT 0.9 1.0  PROT 8.0 7.2  ALBUMIN 4.2 3.8   No results for input(s): LIPASE, AMYLASE in the last 168 hours. No results for input(s): AMMONIA in the last 168 hours. Coagulation Profile: Recent Labs  Lab 05/17/19 0436 05/21/19 1530  INR 1.3* 1.7*   Cardiac Enzymes: No results for input(s): CKTOTAL, CKMB, CKMBINDEX, TROPONINI in the last 168 hours. BNP (last 3 results) No results for input(s): PROBNP in the last 8760 hours. HbA1C: No results for input(s): HGBA1C in the last 72 hours. CBG: Recent Labs  Lab 05/21/19 1535  GLUCAP 123*   Lipid Profile: No results for input(s): CHOL, HDL, LDLCALC, TRIG, CHOLHDL, LDLDIRECT in the last 72 hours. Thyroid Function Tests: No results for input(s): TSH, T4TOTAL, FREET4,  T3FREE, THYROIDAB in the last 72 hours. Anemia Panel: Recent Labs  05/19/19 0506  FERRITIN 178  TIBC 280  IRON 37*   Sepsis Labs: No results for input(s): PROCALCITON, LATICACIDVEN in the last 168 hours.  Recent Results (from the past 240 hour(s))  SARS Coronavirus 2 (CEPHEID - Performed in [facility] hospital lab), Hosp Order     Status: None   Collection Time: 05/16/19 11:41 PM   Specimen: Nasopharyngeal Swab  Result Value Ref Range Status   SARS Coronavirus 2 NEGATIVE NEGATIVE Final    Comment: (NOTE) If result is NEGATIVE SARS-CoV-2 target nucleic acids are NOT DETECTED. The SARS-CoV-2 RNA is generally detectable in upper and lower  respiratory specimens during the acute phase of infection. The lowest  concentration of SARS-CoV-2 viral copies this assay can detect is 250  copies / mL. A negative result does not preclude SARS-CoV-2 infection  and should not be used as the sole basis for treatment or other  patient management decisions.  A negative result may occur with  improper specimen collection / handling, submission of specimen other  than nasopharyngeal swab, presence of viral mutation(s) within the  areas targeted by this assay, and inadequate number of viral copies  (<250 copies / mL). A negative result must be combined with clinical  observations, patient history, and epidemiological information. If result is POSITIVE SARS-CoV-2 target nucleic acids are DETECTED. The SARS-CoV-2 RNA is generally detectable in upper and lower  respiratory specimens dur ing the acute phase of infection.  Positive  results are indicative of active infection with SARS-CoV-2.  Clinical  correlation with patient history and other diagnostic information is  necessary to determine patient infection status.  Positive results do  not rule out bacterial infection or co-infection with other viruses. If result is PRESUMPTIVE POSTIVE SARS-CoV-2 nucleic acids MAY BE PRESENT.   A presumptive  positive result was obtained on the submitted specimen  and confirmed on repeat testing.  While 2019 novel coronavirus  (SARS-CoV-2) nucleic acids may be present in the submitted sample  additional confirmatory testing may be necessary for epidemiological  and / or clinical management purposes  to differentiate between  SARS-CoV-2 and other Sarbecovirus currently known to infect humans.  If clinically indicated additional testing with an alternate test  methodology [phone]) is advised. The SARS-CoV-2 RNA is generally  detectable in upper and lower respiratory sp ecimens during the acute  phase of infection. The expected result is Negative. Fact Sheet for Patients:  BoilerBrush.com.cyhttps://www.fda.gov/media/136312/download Fact Sheet for Healthcare Providers: https://pope.com/https://www.fda.gov/media/136313/download This test is not yet approved or cleared by the [place] FDA and has been authorized for detection and/or diagnosis of SARS-CoV-2 by FDA under an Emergency Use Authorization (EUA).  This EUA will remain in effect (meaning this test can be used) for the duration of the COVID-19 declaration under Section 564(b)(1) of the Act, 21 U.S.C. section 360bbb-3(b)(1), unless the authorization is terminated or revoked sooner. Performed at [facility], [address] [place], [place] [address]   Surgical pcr screen     Status: None   Collection Time: 05/20/19  1:39 PM   Specimen: Nasal Mucosa; Nasal Swab  Result Value Ref Range Status   MRSA, PCR NEGATIVE NEGATIVE Final   Staphylococcus aureus NEGATIVE NEGATIVE Final    Comment: (NOTE) The Xpert SA Assay (FDA approved for NASAL specimens in patients 66 years of age and older), is one component of a comprehensive surveillance program. It is not intended to diagnose infection nor to guide or monitor treatment. Performed at [facility], [address] [place], [place] [address]       LOS: 4 days    Time spent: 25 min    Vonzella NippleAnupama  Mireya Meditz, MD [facility] Pager on amion  If 7PM-7AM, please contact night-coverage [email or web address] Password [email or web address] 05/21/2019, 4:15 PM

## 2019-05-21 NOTE — Progress Notes (Signed)
Updated patient's spouse [facility] via phone.  Joellen Jersey, RN

## 2019-05-21 NOTE — Op Note (Signed)
CARDIOTHORACIC SURGERY OPERATIVE NOTE  Date of Procedure:  05/21/2019  Preoperative Diagnosis:   Severe Aortic Insufficiency  Severe 3-vessel Coronary Artery Disease  Unstable Angina Pectoris  Postoperative Diagnosis: Same  Procedure:    Aortic Valve Replacement  Edwards Inspiris Resilia Stented Bovine Pericardial Tissue Valve (size 35mm, model # 11500A, serial # [id number])  Teflon felt plication of aortic root   Coronary Artery Bypass Grafting x 4  Left Internal Mammary Artery to Distal Left Anterior Descending Coronary Artery  Saphenous Vein Graft to Left Posterior Descending Coronary Artery  Saphenous Vein Graft to Obtuse Marginal Branch of Left Circumflex Coronary Artery  Saphenous Vein Graft to Diagonal Branch of Left Anterior Descending Coronary Artery  Endoscopic Vein Harvest from Right Thigh and Lower Leg  Surgeon: Valentina Gu. Roxy Manns, MD  Assistants: Melodie Bouillon, MD and Ellwood Handler, PA-C  Anesthesia: Adele Barthel, MD  Operative Findings:  [place] type I bicuspid aortic valve with severe aortic insufficiency  Dilated left ventricle with normal left ventricular systolic function  Good quality left internal mammary artery conduit  Good quality saphenous vein conduit  Good quality target vessels for grafting           BRIEF CLINICAL NOTE AND INDICATIONS FOR SURGERY  Patient is a 68 year old male with no previous cardiac history who has been referred for surgical consultation to discuss treatment options for management of severe aortic insufficiency and multivessel coronary artery disease with unstable angina pectoris.  Patient denies any known history of heart disease, heart murmur, or history of rheumatic fever.  He has a long history of tobacco abuse, continues to smoke 1 pack cigarettes daily and has been diagnosed with COPD.  He denies any known history of hyperlipidemia or strong family history of coronary artery disease.  He states that for the  past year or more he has been experiencing substernal chest discomfort with physical exertion.  Symptoms have progressed recently and occasionally occur when he bends over at the waist or stands up.  Symptoms are always relieved by rest.  More recently symptoms have occurred following meals and become associated with diaphoresis.  He denies any associated symptoms of exertional shortness of breath, resting shortness of breath, PND, or orthopnea.  He has recently developed some mild lower extremity edema.  He presented to [facility] May 14, 2019 with complaints of similar chest pain.  Troponin levels were reportedly minimally elevated but BNP level is elevated 17,900.  Echocardiogram there revealed dilated left ventricle with moderate to severe left ventricular systolic dysfunction, ejection fraction estimated 35 to 40%.  He was noted to have severe aortic insufficiency.  He was transferred to [facility] for further management.  Subsequent transthoracic echocardiogram confirmed the presence of moderate to severe left ventricular chamber enlargement and global left ventricular systolic dysfunction.  There was moderate to severe aortic insufficiency.  Left and right heart catheterization performed by Dr. Burt Knack revealed multivessel coronary artery disease with normal right heart pressures and cardiac output.  Cardiothoracic surgical consultation was requested.  The patient has been seen in consultation and counseled at length regarding the indications, risks and potential benefits of surgery.  All questions have been answered, and the patient provides full informed consent for the operation as described.    DETAILS OF THE OPERATIVE PROCEDURE  Preparation:  The patient is brought to the operating room on the above mentioned date and central monitoring was established by the anesthesia team including placement of Swan-Ganz catheter and radial arterial line. The  patient is placed in the  supine position on the operating table.  Intravenous antibiotics are administered. General endotracheal anesthesia is induced uneventfully. A Foley catheter is placed.  Baseline transesophageal echocardiogram was performed.  Findings were notable for bicuspid aortic valve with severe aortic insufficiency.  The left ventricle was dilated but there was normal LV systolic function.  There was trivial mitral regurgitation.  The patient's chest, abdomen, both groins, and both lower extremities are prepared and draped in a sterile manner. A time out procedure is performed.   Surgical Approach and Conduit Harvest:  A median sternotomy incision was performed and the left internal mammary artery is dissected from the chest wall and prepared for bypass grafting. The left internal mammary artery is notably good quality conduit. Simultaneously, saphenous vein is obtained from the patient's right thigh an using endoscopic vein harvest technique. The saphenous vein is notably good quality conduit. After removal of the saphenous vein, the small surgical incisions in the lower extremity are closed with absorbable suture. Following systemic heparinization, the left internal mammary artery was transected distally noted to have excellent flow.   Extracorporeal Cardiopulmonary Bypass and Myocardial Protection:  The pericardium is opened. The ascending aorta is mildly dilated in appearance. The ascending aorta and the right atrium are cannulated for cardiopulmonary bypass.  Adequate heparinization is verified.   A retrograde cardioplegia cannula is placed through the right atrium into the coronary sinus.  The operative field was continuously flooded with carbon dioxide gas.  The entire pre-bypass portion of the operation was notable for stable hemodynamics.  Cardiopulmonary bypass was begun and a left ventricular vent placed through the right superior pulmonary vein.  The surface of the heart inspected. Distal target  vessels are selected for coronary artery bypass grafting. A cardioplegia cannula is placed in the ascending aorta.  A temperature probe was placed in the interventricular septum.  The patient is cooled to 32C systemic temperature.  The aortic cross clamp is applied and cardioplegia is delivered initially in an antegrade fashion through the aortic root using modified [place] cold blood cardioplegia ([place] blood cardioplegia protocol).  As soon as the patient developed ventricular fibrillation cardioplegia was switched and administered retrograde through the coronary sinus catheter.  The initial cardioplegic arrest is rapid with early diastolic arrest.  Repeat doses of cardioplegia are administered at 90 minutes and every 30 minutes thereafter through the aortic root, the coronary sinus catheter, and through subsequently placed vein grafts in order to maintain completely flat electrocardiogram and septal myocardial temperature below 15C.  Myocardial protection was felt to be excellent.   Coronary Artery Bypass Grafting:   The posterior descending branch of the distal left circumflex coronary artery was grafted using a reversed saphenous vein graft in an end-to-side fashion.  At the site of distal anastomosis the target vessel was fair quality and measured approximately 1.5 mm in diameter.  The obtuse marginal branch of the left circumflex coronary artery was grafted using a reversed saphenous vein graft in an end-to-side fashion.  At the site of distal anastomosis the target vessel was fair quality and measured approximately 2.0 mm in diameter.  The diagonal branch of the left anterior descending coronary artery was grafted using a reversed greater saphenous vein graft in end-to-side fashion.  At the site of distal anastomosis the target vessel was good quality and measured approximately 1.7 mm in diameter.  The distal left anterior coronary artery was grafted with the left internal mammary  artery in an end-to-side fashion.  At the site of distal Anastomosis the target vessel was good quality and measured approximately 2.0 mm in diameter.   Aortic Valve Replacement:  A low transverse aortotomy incision was performed.  The aortic valve was inspected and notable for a congenitally bicuspid valve ([place] type I) with a single raphae between the left and right leaflets.  There was severe aortic insufficiency.  The aortic valve leaflets were excised sharply.  Decalcification was not necessary.  The aortic annulus was sized to accept a 25 mm prosthesis.  The aortic root and left ventricle were irrigated with copious cold saline solution.  Aortic valve replacement was performed using interrupted horizontal mattress 2-0 Ethibond pledgeted sutures with pledgets in the subannular position.  An Edwards Inspiris Resilia stented bovine pericardial tissue valve (size 25 mm, model # 11500A, serial # [id number]) was implanted uneventfully. The valve seated appropriately with adequate space beneath the left main and right coronary artery.   Plication of Aortic Root:  The aortotomy was closed using a 2-layer closure of running 4-0 Prolene suture with Teflon felt strips placed circumferentially around the aortic root to plicate the diameter of the aortic root and prevent future aneurysmal dilatation.   Procedure Completion:  All proximal vein graft anastomoses were placed directly to the ascending aorta prior to removal of the aortic cross clamp.  The septal myocardial temperature rose rapidly after reperfusion of the left internal mammary artery graft.  One final dose of warm retrograde "reanimation dose" cardioplegia was administered through the coronary sinus catheter while all air was evacuated through the aortic root.  The aortic cross clamp was removed after a total cross clamp time of 155 minutes.  All proximal and distal coronary anastomoses were inspected for hemostasis and appropriate graft  orientation.  Some bleeding developed from the proximal vein graft anastomosis which could not be controlled without compromising the integrity of the anastomosis.  Subsequently the proximal end of this vein graft was transected and reimplanted on the proximal end of the vein graft to the diagonal branch.  Epicardial pacing wires are fixed to the inferior right ventricular freewall and to the right atrial appendage. The patient is rewarmed to 37C temperature. The aortic and left ventricular vents were removed.  The patient is weaned and disconnected from cardiopulmonary bypass.  The patient's rhythm at separation from bypass was AV paced.  The patient was weaned from cardiopulmonary bypass without any inotropic support. Total cardiopulmonary bypass time for the operation was 212 minutes.  Followup transesophageal echocardiogram performed after separation from bypass revealeda well-seated bioprosthetic tissue valve in the aortic position that was functioning normally.  There was no paravalvular leak.  There were otherwise no changes from the preoperative exam.  The aortic and venous cannula were removed uneventfully. Protamine was administered to reverse the anticoagulation. The mediastinum and pleural space were inspected for hemostasis and irrigated with saline solution. The mediastinum and the left pleural space were drained using 3 chest tubes placed through separate stab incisions inferiorly.  The soft tissues anterior to the aorta were reapproximated loosely. The sternum is closed with double strength sternal wire. The soft tissues anterior to the sternum were closed in multiple layers and the skin is closed with a running subcuticular skin closure.  The post-bypass portion of the operation was notable for stable rhythm and hemodynamics.  The patient received 2 units packed red blood cells during the procedure due to anemia which was present preoperatively and exacerbated by acute blood loss and  hemodilution during cardiopulmonary  bypass.    Disposition:  The patient tolerated the procedure well and is transported to the surgical intensive care in stable condition. There are no intraoperative complications. All sponge instrument and needle counts are verified correct at completion of the operation.   Salvatore Decentlarence H. Cornelius Moraswen MD 05/21/2019 2:32 PM

## 2019-05-21 NOTE — Progress Notes (Signed)
NIF: -32 VC: .9L IS: 659ml.  Pt extubated to [place] per protocol.  Audible cuff leak and no stridor present post extubation.

## 2019-05-21 NOTE — Progress Notes (Signed)
Extubated at 21:33 with RT and RN at bedside. Now on 4L [place]. Will draw ABG in an hour.

## 2019-05-21 NOTE — Transfer of Care (Signed)
Immediate Anesthesia Transfer of Care Note  Patient: Dennis Gomez  Procedure(s) Performed: CORONARY ARTERY BYPASS GRAFTING (CABG) x Four , using left internal mammary artery and right leg greater saphenous vein harvested endoscopically (N/A Chest) AORTIC VALVE REPLACEMENT (AVR) using Inspiris Valve size 71mm (N/A Chest) TRANSESOPHAGEAL ECHOCARDIOGRAM (TEE) (N/A )  Patient Location: ICU  Anesthesia Type:General  Level of Consciousness: Patient remains intubated per anesthesia plan  Airway & Oxygen Therapy: Patient remains intubated per anesthesia plan and Patient placed on Ventilator (see vital sign flow sheet for setting)  Post-op Assessment: Report given to RN and Post -op Vital signs reviewed and stable  Post vital signs: Reviewed and stable  Last Vitals:  Vitals Value Taken Time  BP 104/61 05/21/19 1500  Temp 35.6 C 05/21/19 1515  Pulse 80 05/21/19 1515  Resp 16 05/21/19 1515  SpO2 95 % 05/21/19 1515  Vitals shown include unvalidated device data.  Last Pain:  Vitals:   05/21/19 0423  TempSrc: Oral  PainSc: 0-No pain      Patients Stated Pain Goal: 0 (40/97/35 3299)  Complications: No apparent anesthesia complications

## 2019-05-21 NOTE — Progress Notes (Signed)
Pt flipped to CPAP/PS by RT Blanca at 2100. Will draw ABG at 21:20.

## 2019-05-21 NOTE — Brief Op Note (Signed)
05/16/2019 - 05/21/2019  2:27 PM  PATIENT:  Dennis Gomez  68 y.o. male  PRE-OPERATIVE DIAGNOSIS:  Coronary Artery Disease Aortic Stenosis  POST-OPERATIVE DIAGNOSIS:  Coronary Artery Disease Aortic Stenosis  PROCEDURE:  Procedure(s):  CORONARY ARTERY BYPASS GRAFTING x4 -Left Internal Mammary Artery to Left Anterior Descending -SVG to Obtuse Marginal -SVG to Diagonal -SVG to Left Posterior Descending Artery  ENDOSCOPIC HARVEST GREATER SAPHENOUS VEIN  -Right Leg  AORTIC VALVE REPLACEMENT (AVR) -25 mm Edwards Resilia Inspiris Bioprosthetic Valve  TRANSESOPHAGEAL ECHOCARDIOGRAM (TEE) (N/A)  SURGEON:  Surgeon(s) and Role:    * Rexene Alberts, MD - Primary    * Lightfoot, Lucile Crater, MD - Assisting  PHYSICIAN ASSISTANT: Ellwood Handler PA-C  ANESTHESIA:   general  EBL:  675 mL   BLOOD ADMINISTERED:CELLSAVER  DRAINS: Left Pleural Chest Tubes, Mediastinal Chest Drains   LOCAL MEDICATIONS USED:  NONE  SPECIMEN:  Source of Specimen:  Aortic Valve Leaflets  DISPOSITION OF SPECIMEN:  PATHOLOGY  COUNTS:  YES  TOURNIQUET:  * No tourniquets in log *  DICTATION: .Dragon Dictation  PLAN OF CARE: Admit to inpatient   PATIENT DISPOSITION:  ICU - intubated and hemodynamically stable.   Delay start of Pharmacological VTE agent (>24hrs) due to surgical blood loss or risk of bleeding: yes

## 2019-05-21 NOTE — Progress Notes (Signed)
Pt switched to 40/4 per rapid wean protocol by RT [facility].

## 2019-05-21 NOTE — Progress Notes (Signed)
Rapid wean initiated per protocol  

## 2019-05-21 NOTE — Progress Notes (Signed)
[facility] BRIEF SICU PROGRESS NOTE  Day of Surgery  S/P Procedure(s) (LRB): CORONARY ARTERY BYPASS GRAFTING (CABG) x Four , using left internal mammary artery and right leg greater saphenous vein harvested endoscopically (N/A) AORTIC VALVE REPLACEMENT (AVR) using Inspiris Valve size 69mm (N/A) TRANSESOPHAGEAL ECHOCARDIOGRAM (TEE) (N/A)   Sedated on vent NSR - AAI paced PA pressures relatively low, cardiac index 1.3-1.5 on low dose neo O2 sats 100% Chest tube output low UOP adequate Labs okay  Plan: Start low dose milrinone.  Wean vent to extubate  Dennis Alberts, MD 05/21/2019 6:30 PM

## 2019-05-21 NOTE — Progress Notes (Signed)
Patient with consistently low CO/CI. Patient has received 3 vials albumin and one 500 ml LR bolus. BP has mostly stabilized on some neo. Dr. Roxy Manns notified of the above. Orders received to obtain a coox. Will wait for weaning to extubate orders after MD rounds.  Joellen Jersey, RN

## 2019-05-21 NOTE — Anesthesia Procedure Notes (Signed)
Central Venous Catheter Insertion Performed by: Murvin Natal, MD, anesthesiologist Start/End8/01/2019 6:50 AM, 05/21/2019 7:10 AM Patient location: Pre-op. Preanesthetic checklist: patient identified, IV checked, site marked, risks and benefits discussed, surgical consent, monitors and equipment checked, pre-op evaluation, timeout performed and anesthesia consent Position: Trendelenburg Lidocaine 1% used for infiltration and patient sedated Hand hygiene performed , maximum sterile barriers used  and Seldinger technique used Catheter size: 9 Fr Total catheter length 12. PA cath was placed.MAC introducer Swan type:thermodilution PA Cath depth:45 Procedure performed using ultrasound guided technique. Ultrasound Notes:anatomy identified, needle tip was noted to be adjacent to the nerve/plexus identified and no ultrasound evidence of intravascular and/or intraneural injection Attempts: 1 Following insertion, line sutured, dressing applied and Biopatch. Post procedure assessment: blood return through all ports, free fluid flow and no air  Patient tolerated the procedure well with no immediate complications.

## 2019-05-21 NOTE — Anesthesia Procedure Notes (Signed)
Procedure Name: Intubation Performed by: Milford Cage, CRNA Pre-anesthesia Checklist: Patient identified, Emergency Drugs available, Suction available and Patient being monitored Patient Re-evaluated:Patient Re-evaluated prior to induction Oxygen Delivery Method: Circle System Utilized Preoxygenation: Pre-oxygenation with 100% oxygen Induction Type: IV induction Ventilation: Mask ventilation without difficulty and Oral airway inserted - appropriate to patient size Laryngoscope Size: Mac and 3 Grade View: Grade I Tube type: Oral Tube size: 7.5 mm Number of attempts: 1 Airway Equipment and Method: Stylet and Oral airway Placement Confirmation: ETT inserted through vocal cords under direct vision,  positive ETCO2 and breath sounds checked- equal and bilateral Secured at: 20 cm Tube secured with: Tape Dental Injury: Teeth and Oropharynx as per pre-operative assessment

## 2019-05-22 ENCOUNTER — Encounter (HOSPITAL_COMMUNITY): Payer: Self-pay | Admitting: Thoracic Surgery (Cardiothoracic Vascular Surgery)

## 2019-05-22 ENCOUNTER — Inpatient Hospital Stay (HOSPITAL_COMMUNITY): Payer: Medicare Other

## 2019-05-22 LAB — CBC
HCT: 27.5 % — ABNORMAL LOW (ref 39.0–52.0)
HCT: 26.9 % — ABNORMAL LOW (ref 39.0–52.0)
HCT: 25.7 % — ABNORMAL LOW (ref 39.0–52.0)
Hemoglobin: 9.1 g/dL — ABNORMAL LOW (ref 13.0–17.0)
Hemoglobin: 9.1 g/dL — ABNORMAL LOW (ref 13.0–17.0)
Hemoglobin: 8.5 g/dL — ABNORMAL LOW (ref 13.0–17.0)
MCH: 30.8 pg (ref 26.0–34.0)
MCH: 31.2 pg (ref 26.0–34.0)
MCH: 30.8 pg (ref 26.0–34.0)
MCHC: 33.1 g/dL (ref 30.0–36.0)
MCHC: 33.8 g/dL (ref 30.0–36.0)
MCHC: 33.1 g/dL (ref 30.0–36.0)
MCV: 93.2 fL (ref 80.0–100.0)
MCV: 92.1 fL (ref 80.0–100.0)
MCV: 93.1 fL (ref 80.0–100.0)
Platelets: 95 10*3/uL — ABNORMAL LOW (ref 150–400)
Platelets: 91 10*3/uL — ABNORMAL LOW (ref 150–400)
Platelets: 83 10*3/uL — ABNORMAL LOW (ref 150–400)
RBC: 2.95 MIL/uL — ABNORMAL LOW (ref 4.22–5.81)
RBC: 2.92 MIL/uL — ABNORMAL LOW (ref 4.22–5.81)
RBC: 2.76 MIL/uL — ABNORMAL LOW (ref 4.22–5.81)
RDW: 17.2 % — ABNORMAL HIGH (ref 11.5–15.5)
RDW: 17.2 % — ABNORMAL HIGH (ref 11.5–15.5)
RDW: 17.7 % — ABNORMAL HIGH (ref 11.5–15.5)
WBC: 9 10*3/uL (ref 4.0–10.5)
WBC: 10.4 10*3/uL (ref 4.0–10.5)
WBC: 11.2 10*3/uL — ABNORMAL HIGH (ref 4.0–10.5)
nRBC: 0 % (ref 0.0–0.2)
nRBC: 0 % (ref 0.0–0.2)
nRBC: 0 % (ref 0.0–0.2)

## 2019-05-22 LAB — BASIC METABOLIC PANEL
Anion gap: 10 (ref 5–15)
Anion gap: 9 (ref 5–15)
Anion gap: 9 (ref 5–15)
BUN: 36 mg/dL — ABNORMAL HIGH (ref 8–23)
BUN: 35 mg/dL — ABNORMAL HIGH (ref 8–23)
BUN: 37 mg/dL — ABNORMAL HIGH (ref 8–23)
CO2: 19 mmol/L — ABNORMAL LOW (ref 22–32)
CO2: 19 mmol/L — ABNORMAL LOW (ref 22–32)
CO2: 20 mmol/L — ABNORMAL LOW (ref 22–32)
Calcium: 7.4 mg/dL — ABNORMAL LOW (ref 8.9–10.3)
Calcium: 7.5 mg/dL — ABNORMAL LOW (ref 8.9–10.3)
Calcium: 7.4 mg/dL — ABNORMAL LOW (ref 8.9–10.3)
Chloride: 113 mmol/L — ABNORMAL HIGH (ref 98–111)
Chloride: 114 mmol/L — ABNORMAL HIGH (ref 98–111)
Chloride: 109 mmol/L (ref 98–111)
Creatinine, Ser: 1.13 mg/dL (ref 0.61–1.24)
Creatinine, Ser: 1.13 mg/dL (ref 0.61–1.24)
Creatinine, Ser: 1.27 mg/dL — ABNORMAL HIGH (ref 0.61–1.24)
GFR calc Af Amer: >60 mL/min (ref >60)
GFR calc Af Amer: >60 mL/min (ref >60)
GFR calc Af Amer: >60 mL/min (ref >60)
GFR calc non Af Amer: >60 mL/min (ref >60)
GFR calc non Af Amer: >60 mL/min (ref >60)
GFR calc non Af Amer: 59 mL/min — ABNORMAL LOW (ref >60)
Glucose, Bld: 177 mg/dL — ABNORMAL HIGH (ref 70–99)
Glucose, Bld: 106 mg/dL — ABNORMAL HIGH (ref 70–99)
Glucose, Bld: 139 mg/dL — ABNORMAL HIGH (ref 70–99)
Potassium: 3.8 mmol/L (ref 3.5–5.1)
Potassium: 3.9 mmol/L (ref 3.5–5.1)
Potassium: 3.8 mmol/L (ref 3.5–5.1)
Sodium: 142 mmol/L (ref 135–145)
Sodium: 142 mmol/L (ref 135–145)
Sodium: 138 mmol/L (ref 135–145)

## 2019-05-22 LAB — GLUCOSE, CAPILLARY
Glucose-Capillary: 101 mg/dL — ABNORMAL HIGH (ref 70–99)
Glucose-Capillary: 113 mg/dL — ABNORMAL HIGH (ref 70–99)
Glucose-Capillary: 114 mg/dL — ABNORMAL HIGH (ref 70–99)
Glucose-Capillary: 116 mg/dL — ABNORMAL HIGH (ref 70–99)
Glucose-Capillary: 123 mg/dL — ABNORMAL HIGH (ref 70–99)
Glucose-Capillary: 131 mg/dL — ABNORMAL HIGH (ref 70–99)
Glucose-Capillary: 136 mg/dL — ABNORMAL HIGH (ref 70–99)
Glucose-Capillary: 138 mg/dL — ABNORMAL HIGH (ref 70–99)
Glucose-Capillary: 158 mg/dL — ABNORMAL HIGH (ref 70–99)
Glucose-Capillary: 94 mg/dL (ref 70–99)

## 2019-05-22 LAB — COOXEMETRY PANEL
Carboxyhemoglobin: 1.4 % (ref 0.5–1.5)
Carboxyhemoglobin: 0.6 % (ref 0.5–1.5)
Methemoglobin: 1.3 % (ref 0.0–1.5)
Methemoglobin: 0.6 % (ref 0.0–1.5)
O2 Saturation: 43.2 %
O2 Saturation: 42.7 %
Total hemoglobin: 9.3 g/dL — ABNORMAL LOW (ref 12.0–16.0)
Total hemoglobin: 9.7 g/dL — ABNORMAL LOW (ref 12.0–16.0)

## 2019-05-22 LAB — MAGNESIUM
Magnesium: 3.3 mg/dL — ABNORMAL HIGH (ref 1.7–2.4)
Magnesium: 3.2 mg/dL — ABNORMAL HIGH (ref 1.7–2.4)
Magnesium: 3.1 mg/dL — ABNORMAL HIGH (ref 1.7–2.4)

## 2019-05-22 MED ORDER — INSULIN ASPART 100 UNIT/ML ~~LOC~~ SOLN
0.0000 [IU] | SUBCUTANEOUS | Status: DC
  Administered 2019-05-22 (×2): 2 [IU] via SUBCUTANEOUS

## 2019-05-22 MED ORDER — ALBUMIN HUMAN 5 % IV SOLN
12.5000 g | INTRAVENOUS | Status: AC
  Administered 2019-05-22: 12.5 g via INTRAVENOUS
  Filled 2019-05-22: qty 250

## 2019-05-22 MED ORDER — SODIUM CHLORIDE 0.9 % IV SOLN
INTRAVENOUS | Status: DC | PRN
  Administered 2019-05-22: 15:00:00 500 mL via INTRAVENOUS

## 2019-05-22 MED ORDER — FUROSEMIDE 10 MG/ML IJ SOLN
20.0000 mg | Freq: Once | INTRAMUSCULAR | Status: AC
  Administered 2019-05-22: 20:00:00 20 mg via INTRAVENOUS
  Filled 2019-05-22: qty 2

## 2019-05-22 MED ORDER — ATORVASTATIN CALCIUM 80 MG PO TABS
80.0000 mg | ORAL_TABLET | Freq: Every day | ORAL | Status: DC
  Administered 2019-05-23 – 2019-05-26 (×4): 80 mg via ORAL
  Filled 2019-05-22 (×4): qty 1

## 2019-05-22 MED ORDER — AMIODARONE HCL IN DEXTROSE 360-4.14 MG/200ML-% IV SOLN
30.0000 mg/h | INTRAVENOUS | Status: AC
  Administered 2019-05-22: 30 mg/h via INTRAVENOUS
  Filled 2019-05-22: qty 200

## 2019-05-22 MED ORDER — ENOXAPARIN SODIUM 30 MG/0.3ML ~~LOC~~ SOLN: 30.0000 mg | Freq: Every day | SUBCUTANEOUS | Status: DC

## 2019-05-22 MED ORDER — ENOXAPARIN SODIUM 40 MG/0.4ML ~~LOC~~ SOLN
40.0000 mg | Freq: Every day | SUBCUTANEOUS | Status: DC
  Administered 2019-05-22 – 2019-05-26 (×5): 40 mg via SUBCUTANEOUS
  Filled 2019-05-22 (×5): qty 0.4

## 2019-05-22 MED ORDER — AMIODARONE LOAD VIA INFUSION
150.0000 mg | Freq: Once | INTRAVENOUS | Status: AC
  Administered 2019-05-22: 150 mg via INTRAVENOUS
  Filled 2019-05-22: qty 83.34

## 2019-05-22 MED ORDER — INSULIN ASPART 100 UNIT/ML ~~LOC~~ SOLN: 0.0000 [IU] | SUBCUTANEOUS | Status: DC

## 2019-05-22 MED ORDER — AMIODARONE HCL IN DEXTROSE 360-4.14 MG/200ML-% IV SOLN
60.0000 mg/h | INTRAVENOUS | Status: AC
  Administered 2019-05-22 (×2): 60 mg/h via INTRAVENOUS
  Filled 2019-05-22 (×2): qty 200

## 2019-05-22 NOTE — Plan of Care (Signed)

## 2019-05-22 NOTE — Progress Notes (Signed)
Patient in afib this morning. New orders to start amio infusion. Approx 15 mins after receiving amio bolus patient's BP began dropping. Adjusted neo as appropriate. This continued for over an hour with MAP consistently <65. Dr. Roxy Manns notified. Orders received for albumin. BP improved after this. Dr. Roxy Manns notified. Patient converted to NSR/SB at 57-62. Will use external pacer to keep HR 70-80.  Joellen Jersey, RN

## 2019-05-22 NOTE — Progress Notes (Signed)
Progress Note  Patient Name: Dennis Gomez Date of Encounter: 05/22/2019   Primary Cardiologist: Lauree Chandler, MD (New Patient)  Subjective   Progressing well despite PAF and low coox  Inpatient Medications    Scheduled Meds: . acetaminophen  1,000 mg Oral Q6H  . amiodarone  150 mg Intravenous Once  . aspirin EC  325 mg Oral Daily  . [START ON 05/23/2019] atorvastatin  80 mg Oral q1800  . bisacodyl  10 mg Oral Daily   Or  . bisacodyl  10 mg Rectal Daily  . chlorhexidine gluconate (MEDLINE KIT)  15 mL Mouth Rinse BID  . Chlorhexidine Gluconate Cloth  6 each Topical Daily  . docusate sodium  200 mg Oral Daily  . enoxaparin (LOVENOX) injection  40 mg Subcutaneous QHS  . insulin aspart  0-24 Units Subcutaneous Q4H  . levothyroxine  100 mcg Oral Daily  . mouth rinse  15 mL Mouth Rinse BID  . [START ON 05/23/2019] pantoprazole  40 mg Oral Daily  . sodium chloride flush  10-40 mL Intracatheter Q12H  . sodium chloride flush  3 mL Intravenous Q12H  . umeclidinium-vilanterol  1 puff Inhalation Daily   Continuous Infusions: . sodium chloride    . amiodarone 60 mg/hr (05/22/19 0911)   Followed by  . amiodarone    . cefUROXime (ZINACEF)  IV 1.5 g (05/22/19 0744)  . famotidine (PEPCID) IV Stopped (05/21/19 1545)  . lactated ringers    . lactated ringers 20 mL/hr at 05/22/19 0710  . milrinone 0.2 mcg/kg/min (05/22/19 0710)  . phenylephrine (NEO-SYNEPHRINE) Adult infusion 25 mcg/min (05/22/19 0710)   PRN Meds: metoprolol tartrate, morphine injection, ondansetron (ZOFRAN) IV, oxyCODONE, sodium chloride flush, sodium chloride flush, traMADol   Vital Signs    Vitals:   05/22/19 0645 05/22/19 0700 05/22/19 0715 05/22/19 0753  BP:      Pulse: 93 89 95 96  Resp: (!) 21 (!) '21 19 20  ' Temp: 97.9 F (36.6 C) 97.7 F (36.5 C) 97.9 F (36.6 C) 97.7 F (36.5 C)  TempSrc:      SpO2: 95% (!) 89% 96% (!) 88%  Weight:      Height:        Intake/Output Summary (Last 24 hours)  at 05/22/2019 0913 Last data filed at 05/22/2019 0800 Gross per 24 hour  Intake 8193.16 ml  Output 3730 ml  Net 4463.16 ml   Last 3 Weights 05/22/2019 05/21/2019 05/19/2019  Weight (lbs) 152 lb 5.4 oz 128 lb 12.8 oz 133 lb 9.6 oz  Weight (kg) 69.1 kg 58.423 kg 60.601 kg      Telemetry    PAF rates 80-95 bpm  ECG    Afib ICLBBB rate 94   Physical Exam   Post sternotomy SEM Abdomen soft  Chest tubes  No edema PT/DP palpable   Labs    High Sensitivity Troponin:  No results for input(s): TROPONINIHS in the last 720 hours.    Cardiac EnzymesNo results for input(s): TROPONINI in the last 168 hours. No results for input(s): TROPIPOC in the last 168 hours.   Chemistry Recent Labs  Lab 05/17/19 0436  05/18/19 0328  05/21/19 0425  05/21/19 1339  05/21/19 2231 05/21/19 2314 05/22/19 0413  NA 139   < > 140   < > 144   < > 143   < > 146* 142 142  K 3.7   < > 3.7   < > 3.8   < > 4.6   < >  4.0 3.8 3.9  CL 100  --  102   < > 107  --   --   --   --  113* 114*  CO2 27  --  25   < > 25  --   --   --   --  19* 19*  GLUCOSE 83  --  82   < > 86   < > 110*  --   --  177* 106*  BUN 36*  --  42*   < > 42*  --   --   --   --  36* 35*  CREATININE 1.46*  --  1.37*   < > 1.21  --   --   --   --  1.13 1.13  CALCIUM 10.1  --  9.5   < > 9.6  --   --   --   --  7.4* 7.5*  PROT 8.0  --  7.2  --   --   --   --   --   --   --   --   ALBUMIN 4.2  --  3.8  --   --   --   --   --   --   --   --   AST 27  --  24  --   --   --   --   --   --   --   --   ALT 14  --  11  --   --   --   --   --   --   --   --   ALKPHOS 65  --  59  --   --   --   --   --   --   --   --   BILITOT 0.9  --  1.0  --   --   --   --   --   --   --   --   GFRNONAA 50*  --  54*   < > >60  --   --   --   --  >60 >60  GFRAA 58*  --  >60   < > >60  --   --   --   --  >60 >60  ANIONGAP 12  --  13   < > 12  --   --   --   --  10 9   < > = values in this interval not displayed.     Hematology Recent Labs  Lab 05/21/19 1530  05/21/19  2231 05/21/19 2314 05/22/19 0413  WBC 11.3*  --   --  9.0 10.4  RBC 2.88*  --   --  2.95* 2.92*  HGB 9.1*   < > 8.8* 9.1* 9.1*  HCT 27.4*   < > 26.0* 27.5* 26.9*  MCV 95.1  --   --  93.2 92.1  MCH 31.6  --   --  30.8 31.2  MCHC 33.2  --   --  33.1 33.8  RDW 16.4*  --   --  17.2* 17.2*  PLT 109*  --   --  95* 91*   < > = values in this interval not displayed.    BNPNo results for input(s): BNP, PROBNP in the last 168 hours.   DDimer No results for input(s): DDIMER in the last 168 hours.   Radiology    Dg Chest [facility] 1 View  Result  Date: 05/22/2019 CLINICAL DATA:  68 year old male with a history of cardiac surgery EXAM: PORTABLE CHEST 1 VIEW COMPARISON:  May 21, 2019 FINDINGS: Cardiomediastinal silhouette unchanged with surgical changes of median sternotomy, CABG, aortic valve repair. Interval removal of the endotracheal tube and gastric tube. Unchanged position of right IJ sheath which transmits Swan-Ganz catheter, with the tip terminating in the region of the main pulmonary artery. Mediastinal/pleural drains unchanged. Epicardial pacing leads in place. No visualized pneumothorax.  No pleural effusion. Low lung volumes with reticular opacities/architectural distortion of the lungs and no evidence of interlobular septal thickening or confluent airspace disease. IMPRESSION: Interval extubation and removal of the gastric tube. Unchanged mediastinal/pleural drains.  No visualized pneumothorax. Low lung volumes and chronic lung changes. Right IJ sheath and Swan-Ganz catheter unchanged. Surgical changes of median sternotomy, CABG, aortic valve repair. Electronically Signed   By: Corrie Mckusick D.O.   On: 05/22/2019 08:27   Dg Chest Port 1 View  Result Date: 05/21/2019 CLINICAL DATA:  Status post aortic valve replacement EXAM: PORTABLE CHEST 1 VIEW COMPARISON:  05/19/2019, 05/17/2009 FINDINGS: Interval intubation, tip of the endotracheal tube is about 3.6 cm superior to the carina. Esophageal tube  tip is in the left upper quadrant. Interval sternotomy and aortic valve replacement. Right IJ Swan-Ganz catheter tip projects over pulmonary outflow tract. Midline and left-sided chest drainage catheters. No appreciable pneumothorax. Mild cardiomegaly. No large effusion. Patchy atelectasis at the left base. IMPRESSION: 1. Interval sternotomy and aortic valve replacement. Endotracheal tube tip about 3.6 cm superior to carina. Placement of additional support lines and tubes as above 2. Patchy atelectasis at the left base. 3. Mild cardiomegaly Electronically Signed   By: Donavan Foil M.D.   On: 05/21/2019 15:49    Cardiac Studies   Echocardiogram 05/17/2019: Impressions: 1. The left ventricle has mildly reduced systolic function, with an ejection fraction of 45-50%. The cavity size was mild to moderately dilated. There is mild concentric left ventricular hypertrophy. Left ventricular diastolic Doppler parameters are  indeterminate. Left ventricular diffuse hypokinesis.  2. Diffuse hypokinesis in basal to mid segments, slightly improved at apex. In multiple coronary territories.  3. The right ventricle has normal systolic function. The cavity was normal. There is no increase in right ventricular wall thickness. Right ventricular systolic pressure could not be assessed.  4. Left atrial size was mildly dilated.  5. Right atrial size was mildly dilated.  6. Trivial pericardial effusion is present.  7. The mitral valve is abnormal.  8. The aortic valve is abnormal. Aortic valve regurgitation is moderate to severe by color flow Doppler.  9. Eccentric aortic regurgitation seen, affecting mitral valve without clear preclosure. Likely at least moderate and possibly severe regurgitation. 10. The aorta is abnormal in size and structure. 11. The aortic arch is normal in size and structure. 12. There is mild dilatation of the aortic root and of the ascending aorta measuring 40 mm. 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 Bi- cuspid AV with no stenosis severe AR by cath / echo 2. Mild ascending aortic root enlargement 4.0 cm with moderate calcific atherosclerosis 3.  Bovine Arch 4. Severe CAD involving the proximal LAD, D1 and OM1 with left dominant circulation and small RCA  Patient Profile   Mr. Arlington is a 68 y.o. male with a history of COPD, hypothyroidism, and tobacco abuse who presented to [facility] on 05/14/2019 for further evaluation of chest pain. Echo showed severely dilated LV with EF of 35-40% and moderate to severe aortic regurgitation. Troponin mildly elevated. Patient was transferred to [facility] for further evaluation. Patient found to have severe multivessel CAD and CABG/AVR done 05/20/19   Assessment & Plan    Post AVR/CABG for severe AR bicuspid valve and CAD. 25 mm Edwards INspiris Resilia Stented bovine pericardial valve with LIMA to LAD, SVG to PDA and SVG to OM Pre op EF 45-50% but intra operative TEE indicated 60-65% ???  Agree with amiodarone for PAF and weaning milrinone If coox remains an issue consider post op echo to assess valve gradients and EF  Jenkins Rouge

## 2019-05-22 NOTE — Progress Notes (Signed)
1 Day Post-Op Procedure(s) (LRB): CORONARY ARTERY BYPASS GRAFTING (CABG) x Four , using left internal mammary artery and right leg greater saphenous vein harvested endoscopically (N/A) AORTIC VALVE REPLACEMENT (AVR) using Inspiris Valve size 58mm (N/A) TRANSESOPHAGEAL ECHOCARDIOGRAM (TEE) (N/A) Subjective: States his stomach bothers him a little after dinner  Objective: Vital signs in last 24 hours: Temp:  [97.3 F (36.3 C)-98.8 F (37.1 C)] 97.3 F (36.3 C) (08/05 1550) Pulse Rate:  [36-143] 69 (08/05 1820) Cardiac Rhythm: Normal sinus rhythm;Atrial fibrillation (08/05 0800) Resp:  [13-26] 18 (08/05 1820) BP: (65-137)/(52-96) 112/77 (08/05 1807) SpO2:  [79 %-100 %] 99 % (08/05 1820) Arterial Line BP: (79-153)/(46-91) 86/49 (08/05 1000) FiO2 (%):  [40 %-50 %] 40 % (08/04 2100) Weight:  [69.1 kg] 69.1 kg (08/05 0500)  Hemodynamic parameters for last 24 hours: PAP: (18-45)/(7-33) 28/15 CVP:  [14 mmHg] 14 mmHg CO:  [2.4 L/min-3.4 L/min] 3 L/min CI:  [1.4 L/min/m2-2.1 L/min/m2] 1.8 L/min/m2  Intake/Output from previous day: 08/04 0701 - 08/05 0700 In: 8186.4 [P.O.:600; I.V.:5293.9; Blood:598; IV Piggyback:1694.5] Out: 3664 [Urine:2375; Blood:675; Chest Tube:640] Intake/Output this shift: Total I/O In: 1956.5 [P.O.:720; I.V.:836.3; IV Piggyback:400.1] Out: 625 [Urine:375; Chest Tube:250]  General appearance: alert, cooperative and no distress Neurologic: intact Heart: regular rate and rhythm, S1, S2 normal, no murmur, click, rub or gallop Wound: dressed, dry  Lab Results: Recent Labs    05/22/19 0413 05/22/19 1714  WBC 10.4 11.2*  HGB 9.1* 8.5*  HCT 26.9* 25.7*  PLT 91* 83*   BMET:  Recent Labs    05/22/19 0413 05/22/19 1714  NA 142 138  K 3.9 3.8  CL 114* 109  CO2 19* 20*  GLUCOSE 106* 139*  BUN 35* 37*  CREATININE 1.13 1.27*  CALCIUM 7.5* 7.4*    PT/INR:  Recent Labs    05/21/19 1530  LABPROT 19.7*  INR 1.7*   ABG    Component Value Date/Time   PHART 7.387 05/21/2019 2231   HCO3 18.6 (L) 05/21/2019 2231   TCO2 20 (L) 05/21/2019 2231   ACIDBASEDEF 6.0 (H) 05/21/2019 2231   O2SAT 42.7 05/22/2019 1745   CBG (last 3)  Recent Labs    05/22/19 0753 05/22/19 1201 05/22/19 1549  GLUCAP 158* 123* 101*    Assessment/Plan: S/P Procedure(s) (LRB): CORONARY ARTERY BYPASS GRAFTING (CABG) x Four , using left internal mammary artery and right leg greater saphenous vein harvested endoscopically (N/A) AORTIC VALVE REPLACEMENT (AVR) using Inspiris Valve size 80mm (N/A) TRANSESOPHAGEAL ECHOCARDIOGRAM (TEE) (N/A) doing well; repeat labs in am; cvp 13--hold diuresis; continue amiodarone   LOS: 5 days    Wonda Olds 05/22/2019

## 2019-05-22 NOTE — Progress Notes (Signed)
  Amiodarone Drug - Drug Interaction Consult Note  Recommendations: 1) Atorvastatin 80 mg po daily - monitor for muscle pain and weakness, continue current therapy 2) Enoxaparin 40 mg  daily - monitor for bleeding, continue current therapy  Amiodarone is metabolized by the cytochrome P450 system and therefore has the potential to cause many drug interactions. Amiodarone has an average plasma half-life of 50 days (range 20 to 100 days).   There is potential for drug interactions to occur several weeks or months after stopping treatment and the onset of drug interactions may be slow after initiating amiodarone.   [x]  Statins: Increased risk of myopathy. Simvastatin- restrict dose to 20mg  daily. Other statins: counsel patients to report any muscle pain or weakness immediately.  [x]  Anticoagulants: Amiodarone can increase anticoagulant effect. Consider warfarin dose reduction. Patients should be monitored closely and the dose of anticoagulant altered accordingly, remembering that amiodarone levels take several weeks to stabilize.  []  Antiepileptics: Amiodarone can increase plasma concentration of phenytoin, the dose should be reduced. Note that small changes in phenytoin dose can result in large changes in levels. Monitor patient and counsel on signs of toxicity.  []  Beta blockers: increased risk of bradycardia, AV block and myocardial depression. Sotalol - avoid concomitant use.  []   Calcium channel blockers (diltiazem and verapamil): increased risk of bradycardia, AV block and myocardial depression.  []   Cyclosporine: Amiodarone increases levels of cyclosporine. Reduced dose of cyclosporine is recommended.  []  Digoxin dose should be halved when amiodarone is started.  []  Diuretics: increased risk of cardiotoxicity if hypokalemia occurs.  []  Oral hypoglycemic agents (glyburide, glipizide, glimepiride): increased risk of hypoglycemia. Patient's glucose levels should be monitored closely when  initiating amiodarone therapy.   []  Drugs that prolong the QT interval:  Torsades de pointes risk may be increased with concurrent use - avoid if possible.  Monitor QTc, also keep magnesium/potassium WNL if concurrent therapy can't be avoided. [name] Antibiotics: e.g. fluoroquinolones, erythromycin. . Antiarrhythmics: e.g. quinidine, procainamide, disopyramide, sotalol. . Antipsychotics: e.g. phenothiazines, haloperidol.  . Lithium, tricyclic antidepressants, and methadone.  Vertis Kelch, PharmD PGY2 Cardiology Pharmacy Resident Phone [phone] 05/22/2019       8:31 AM  Please check [email or web address] for unit-specific pharmacist phone numbers

## 2019-05-22 NOTE — Progress Notes (Signed)
Notified Dr. Orvan Seen of patient's low urine output. No new orders.  Joellen Jersey, RN

## 2019-05-22 NOTE — Progress Notes (Addendum)
[facility] DAILY ICU PROGRESS NOTE                   [address]            [place] [address]          [phone]   1 Day Post-Op Procedure(s) (LRB): CORONARY ARTERY BYPASS GRAFTING (CABG) x Four , using left internal mammary artery and right leg greater saphenous vein harvested endoscopically (N/A) AORTIC VALVE REPLACEMENT (AVR) using Inspiris Valve size 41m (N/A) TRANSESOPHAGEAL ECHOCARDIOGRAM (TEE) (N/A)  Total Length of Stay:  LOS: 5 days   Subjective:  States he is doing fine.  States his pain is fine.  Denies N/V  Objective: Vital signs in last 24 hours: Temp:  [95.5 F (35.3 C)-98.8 F (37.1 C)] 97.9 F (36.6 C) (08/05 0715) Pulse Rate:  [36-112] 95 (08/05 0715) Cardiac Rhythm: Normal sinus rhythm;Sinus tachycardia (08/05 0400) Resp:  [12-26] 19 (08/05 0715) BP: (83-123)/(61-91) 93/68 (08/05 0400) SpO2:  [79 %-100 %] 96 % (08/05 0715) Arterial Line BP: (79-153)/(46-91) 108/59 (08/05 0715) FiO2 (%):  [40 %-50 %] 40 % (08/04 2100) Weight:  [69.1 kg] 69.1 kg (08/05 0500)  Filed Weights   05/19/19 0553 05/21/19 0423 05/22/19 0500  Weight: 60.6 kg 58.4 kg 69.1 kg    Weight change: 10.7 kg   Hemodynamic parameters for last 24 hours: PAP: (17-45)/(7-33) 23/15 CO:  [2.1 L/min-3.4 L/min] 3 L/min CI:  [1.3 L/min/m2-2.1 L/min/m2] 1.9 L/min/m2  Intake/Output from previous day: 08/04 0701 - 08/05 0700 In: 8186.4 [P.O.:600; I.V.:5293.9; Blood:598; IV Piggyback:1694.5] Out: 33557[Urine:2375; Blood:675; Chest Tube:640]  Intake/Output this shift: Total I/O In: 6.8 [I.V.:6.8] Out: -   Current Meds: Scheduled Meds: . acetaminophen  1,000 mg Oral Q6H  . aspirin EC  325 mg Oral Daily  . [START ON 05/23/2019] atorvastatin  80 mg Oral q1800  . bisacodyl  10 mg Oral Daily   Or  . bisacodyl  10 mg Rectal Daily  . chlorhexidine gluconate (MEDLINE KIT)  15 mL Mouth Rinse BID  . Chlorhexidine Gluconate Cloth  6 each Topical Daily  . docusate sodium  200 mg  Oral Daily  . enoxaparin (LOVENOX) injection  40 mg Subcutaneous QHS  . insulin aspart  0-24 Units Subcutaneous Q4H  . levothyroxine  100 mcg Oral Daily  . mouth rinse  15 mL Mouth Rinse BID  . [START ON 05/23/2019] pantoprazole  40 mg Oral Daily  . sodium chloride flush  10-40 mL Intracatheter Q12H  . sodium chloride flush  3 mL Intravenous Q12H  . umeclidinium-vilanterol  1 puff Inhalation Daily   Continuous Infusions: . sodium chloride    . cefUROXime (ZINACEF)  IV Stopped (05/21/19 1934)  . famotidine (PEPCID) IV Stopped (05/21/19 1545)  . lactated ringers    . lactated ringers 20 mL/hr at 05/22/19 0710  . milrinone 0.2 mcg/kg/min (05/22/19 0710)  . phenylephrine (NEO-SYNEPHRINE) Adult infusion 25 mcg/min (05/22/19 0710)   PRN Meds:.metoprolol tartrate, morphine injection, ondansetron (ZOFRAN) IV, oxyCODONE, sodium chloride flush, sodium chloride flush, traMADol  General appearance: alert, cooperative and no distress Heart: regular rate and rhythm Lungs: clear to auscultation bilaterally Abdomen: soft, non-tender; bowel sounds normal; no masses,  no organomegaly Extremities: edema trace Wound: clean and dry EVH sites, aquacel on sternotomy  Lab Results: CBC: Recent Labs    05/21/19 2314 05/22/19 0413  WBC 9.0 10.4  HGB 9.1* 9.1*  HCT 27.5* 26.9*  PLT 95* 91*   BMET:  Recent  Labs    05/21/19 2314 05/22/19 0413  NA 142 142  K 3.8 3.9  CL 113* 114*  CO2 19* 19*  GLUCOSE 177* 106*  BUN 36* 35*  CREATININE 1.13 1.13  CALCIUM 7.4* 7.5*    CMET: Lab Results  Component Value Date   WBC 10.4 05/22/2019   HGB 9.1 (L) 05/22/2019   HCT 26.9 (L) 05/22/2019   PLT 91 (L) 05/22/2019   GLUCOSE 106 (H) 05/22/2019   ALT 11 05/18/2019   AST 24 05/18/2019   NA 142 05/22/2019   K 3.9 05/22/2019   CL 114 (H) 05/22/2019   CREATININE 1.13 05/22/2019   BUN 35 (H) 05/22/2019   CO2 19 (L) 05/22/2019   INR 1.7 (H) 05/21/2019   HGBA1C 5.6 05/18/2019      PT/INR:   Recent Labs    05/21/19 1530  LABPROT 19.7*  INR 1.7*   Radiology: Dg Chest Port 1 View  Result Date: 05/21/2019 CLINICAL DATA:  Status post aortic valve replacement EXAM: PORTABLE CHEST 1 VIEW COMPARISON:  05/19/2019, 05/17/2009 FINDINGS: Interval intubation, tip of the endotracheal tube is about 3.6 cm superior to the carina. Esophageal tube tip is in the left upper quadrant. Interval sternotomy and aortic valve replacement. Right IJ Swan-Ganz catheter tip projects over pulmonary outflow tract. Midline and left-sided chest drainage catheters. No appreciable pneumothorax. Mild cardiomegaly. No large effusion. Patchy atelectasis at the left base. IMPRESSION: 1. Interval sternotomy and aortic valve replacement. Endotracheal tube tip about 3.6 cm superior to carina. Placement of additional support lines and tubes as above 2. Patchy atelectasis at the left base. 3. Mild cardiomegaly Electronically Signed   By: Donavan Foil M.D.   On: 05/21/2019 15:49     Assessment/Plan: S/P Procedure(s) (LRB): CORONARY ARTERY BYPASS GRAFTING (CABG) x Four , using left internal mammary artery and right leg greater saphenous vein harvested endoscopically (N/A) AORTIC VALVE REPLACEMENT (AVR) using Inspiris Valve size 8m (N/A) TRANSESOPHAGEAL ECHOCARDIOGRAM (TEE) (N/A)  1. CV- NSR, on Neo for BP, Milrinone wean as tolerated, will get Coox in AM, wean Neo for BP as able 2. Pulm- no acute issues, 640 cc output from chest tube, no pleural effusion on CXR, no pneumothorax... leave chest tubes in place for now 3. Renal- creatinine stable at 1.13, if accurate weight is up 11 lbs, hold off on diuretics for now while on Neo 4. Expected post operative blood loss anemia, mild Hgb at 9.1 5. CBGs- off insulin drip since late last night, will continue SSIP 6. Dispo- patient stable, maintaining NSR, on Neo, Milrinone will wean as able, Hgb stable, POD #1 progression orders     EEllwood Handler8/02/2019 7:33 AM    I have  seen and examined the patient and agree with the assessment and plan as outlined.  Looks great although in and out of Afib w/ controlled rate.  Mixed venous co-ox low despite normal cardiac output by Swan-Ganz thermodilution.  Will add amiodarone.  Mobilize.  Wean milrinone slowly.  D/C tubes later today or tomorrow, depending on output.  Hold diuretics until BP improved and off Neo drip.  CRexene Alberts MD 05/22/2019 8:25 AM

## 2019-05-23 ENCOUNTER — Telehealth: Payer: Self-pay | Admitting: Cardiovascular Disease

## 2019-05-23 ENCOUNTER — Inpatient Hospital Stay (HOSPITAL_COMMUNITY): Payer: Medicare Other

## 2019-05-23 DIAGNOSIS — N183 Chronic kidney disease, stage 3 (moderate): Secondary | ICD-10-CM

## 2019-05-23 LAB — CBC
HCT: 23.9 % — ABNORMAL LOW (ref 39.0–52.0)
Hemoglobin: 7.9 g/dL — ABNORMAL LOW (ref 13.0–17.0)
MCH: 30.5 pg (ref 26.0–34.0)
MCHC: 33.1 g/dL (ref 30.0–36.0)
MCV: 92.3 fL (ref 80.0–100.0)
Platelets: 81 10*3/uL — ABNORMAL LOW (ref 150–400)
RBC: 2.59 MIL/uL — ABNORMAL LOW (ref 4.22–5.81)
RDW: 17.5 % — ABNORMAL HIGH (ref 11.5–15.5)
WBC: 10.1 10*3/uL (ref 4.0–10.5)
nRBC: 0 % (ref 0.0–0.2)

## 2019-05-23 LAB — BPAM RBC
Blood Product Expiration Date: 202008232359
Blood Product Expiration Date: 202008232359
Blood Product Expiration Date: 202008242359
Blood Product Expiration Date: 202008242359
Blood Product Expiration Date: 202008242359
Blood Product Expiration Date: 202008242359
Blood Product Expiration Date: 202008242359
ISSUE DATE / TIME: 202008040820
ISSUE DATE / TIME: 202008040820
ISSUE DATE / TIME: 202008041237
ISSUE DATE / TIME: 202008041508
ISSUE DATE / TIME: 202008041848
Unit Type and Rh: 6200
Unit Type and Rh: 6200
Unit Type and Rh: 6200
Unit Type and Rh: 6200
Unit Type and Rh: 6200
Unit Type and Rh: 6200
Unit Type and Rh: 6200

## 2019-05-23 LAB — TYPE AND SCREEN
ABO/RH(D): A POS
Antibody Screen: NEGATIVE
Unit division: 0
Unit division: 0
Unit division: 0
Unit division: 0
Unit division: 0
Unit division: 0
Unit division: 0

## 2019-05-23 LAB — COOXEMETRY PANEL
Carboxyhemoglobin: 0.7 % (ref 0.5–1.5)
Methemoglobin: 0.7 % (ref 0.0–1.5)
O2 Saturation: 51.5 %
Total hemoglobin: 7.8 g/dL — ABNORMAL LOW (ref 12.0–16.0)

## 2019-05-23 LAB — BASIC METABOLIC PANEL
Anion gap: 9 (ref 5–15)
BUN: 34 mg/dL — ABNORMAL HIGH (ref 8–23)
CO2: 21 mmol/L — ABNORMAL LOW (ref 22–32)
Calcium: 7.5 mg/dL — ABNORMAL LOW (ref 8.9–10.3)
Chloride: 107 mmol/L (ref 98–111)
Creatinine, Ser: 1.3 mg/dL — ABNORMAL HIGH (ref 0.61–1.24)
GFR calc Af Amer: >60 mL/min (ref >60)
GFR calc non Af Amer: 57 mL/min — ABNORMAL LOW (ref >60)
Glucose, Bld: 114 mg/dL — ABNORMAL HIGH (ref 70–99)
Potassium: 3.5 mmol/L (ref 3.5–5.1)
Sodium: 137 mmol/L (ref 135–145)

## 2019-05-23 LAB — GLUCOSE, CAPILLARY
Glucose-Capillary: 104 mg/dL — ABNORMAL HIGH (ref 70–99)
Glucose-Capillary: 73 mg/dL (ref 70–99)
Glucose-Capillary: 90 mg/dL (ref 70–99)

## 2019-05-23 LAB — PREPARE RBC (CROSSMATCH)

## 2019-05-23 MED ORDER — AMIODARONE HCL 200 MG PO TABS
200.0000 mg | ORAL_TABLET | Freq: Two times a day (BID) | ORAL | Status: DC
  Administered 2019-05-23 – 2019-05-27 (×9): 200 mg via ORAL
  Filled 2019-05-23 (×9): qty 1

## 2019-05-23 MED ORDER — FUROSEMIDE 10 MG/ML IJ SOLN
20.0000 mg | Freq: Four times a day (QID) | INTRAMUSCULAR | Status: AC
  Administered 2019-05-23 (×3): 20 mg via INTRAVENOUS
  Filled 2019-05-23 (×3): qty 2

## 2019-05-23 MED ORDER — FE FUMARATE-B12-VIT C-FA-IFC PO CAPS
1.0000 | ORAL_CAPSULE | Freq: Three times a day (TID) | ORAL | Status: DC
  Administered 2019-05-23 – 2019-05-27 (×12): 1 via ORAL
  Filled 2019-05-23 (×12): qty 1

## 2019-05-23 MED ORDER — FUROSEMIDE 40 MG PO TABS
40.0000 mg | ORAL_TABLET | Freq: Every day | ORAL | Status: DC
  Administered 2019-05-24 – 2019-05-27 (×4): 40 mg via ORAL
  Filled 2019-05-23 (×5): qty 1

## 2019-05-23 MED ORDER — SODIUM CHLORIDE 0.9% IV SOLUTION: Freq: Once | INTRAVENOUS | Status: AC

## 2019-05-23 MED ORDER — POTASSIUM CHLORIDE 10 MEQ/50ML IV SOLN
10.0000 meq | INTRAVENOUS | Status: AC
  Administered 2019-05-23 (×5): 10 meq via INTRAVENOUS
  Filled 2019-05-23 (×5): qty 50

## 2019-05-23 MED ORDER — POTASSIUM CHLORIDE CRYS ER 20 MEQ PO TBCR
20.0000 meq | EXTENDED_RELEASE_TABLET | Freq: Every day | ORAL | Status: DC
  Administered 2019-05-24 – 2019-05-27 (×4): 20 meq via ORAL
  Filled 2019-05-23 (×4): qty 1

## 2019-05-23 MED ORDER — MOVING RIGHT ALONG BOOK
Freq: Once | Status: AC
  Administered 2019-05-23: 09:00:00
  Filled 2019-05-23: qty 1

## 2019-05-23 MED FILL — Lidocaine HCl Local Preservative Free (PF) Inj 2%: INTRAMUSCULAR | Qty: 15 | Status: AC

## 2019-05-23 MED FILL — Sodium Bicarbonate IV Soln 8.4%: INTRAVENOUS | Qty: 50 | Status: AC

## 2019-05-23 MED FILL — Heparin Sodium (Porcine) Inj 1000 Unit/ML: INTRAMUSCULAR | Qty: 30 | Status: AC

## 2019-05-23 MED FILL — Potassium Chloride Inj 2 mEq/ML: INTRAVENOUS | Qty: 40 | Status: AC

## 2019-05-23 MED FILL — Heparin Sodium (Porcine) Inj 1000 Unit/ML: INTRAMUSCULAR | Qty: 10 | Status: AC

## 2019-05-23 MED FILL — Electrolyte-R (PH 7.4) Solution: INTRAVENOUS | Qty: 1000 | Status: AC

## 2019-05-23 MED FILL — Sodium Chloride IV Soln 0.9%: INTRAVENOUS | Qty: 4000 | Status: AC

## 2019-05-23 NOTE — Progress Notes (Signed)
Patient transferred from 2 Heart. In bed resting. No s/s of acute distress noted. Nursing will continue to monitor.

## 2019-05-23 NOTE — Progress Notes (Addendum)
TCTS DAILY ICU PROGRESS NOTE                   [address]            [place] [address]          [phone]   2 Days Post-Op Procedure(s) (LRB): CORONARY ARTERY BYPASS GRAFTING (CABG) x Four , using left internal mammary artery and right leg greater saphenous vein harvested endoscopically (N/A) AORTIC VALVE REPLACEMENT (AVR) using Inspiris Valve size 43m (N/A) TRANSESOPHAGEAL ECHOCARDIOGRAM (TEE) (N/A)  Total Length of Stay:  LOS: 6 days   Subjective:   Patient without specific complaints.  Denies chest pain, shortness of breath, N/V  Objective: Vital signs in last 24 hours: Temp:  [97.3 F (36.3 C)-97.9 F (36.6 C)] 97.5 F (36.4 C) (08/06 0400) Pulse Rate:  [55-143] 69 (08/06 0600) Cardiac Rhythm: Normal sinus rhythm;Atrial paced (08/06 0400) Resp:  [15-24] 20 (08/06 0600) BP: (65-137)/(52-96) 115/75 (08/06 0600) SpO2:  [85 %-100 %] 96 % (08/06 0600) Arterial Line BP: (86-126)/(49-72) 86/49 (08/05 1000) Weight:  [68.2 kg] 68.2 kg (08/06 0500)  Filed Weights   05/21/19 0423 05/22/19 0500 05/23/19 0500  Weight: 58.4 kg 69.1 kg 68.2 kg    Weight change: -0.9 kg   Hemodynamic parameters for last 24 hours: PAP: (22-28)/(11-15) 28/15 CVP:  [14 mmHg] 14 mmHg CO:  [3 L/min] 3 L/min CI:  [1.8 L/min/m2] 1.8 L/min/m2  Intake/Output from previous day: 08/05 0701 - 08/06 0700 In: 2519.5 [P.O.:720; I.V.:1298.8; IV Piggyback:500.7] Out: 1560 [Urine:1050; Chest Tube:510]  Current Meds: Scheduled Meds: . acetaminophen  1,000 mg Oral Q6H  . aspirin EC  325 mg Oral Daily  . atorvastatin  80 mg Oral q1800  . bisacodyl  10 mg Oral Daily   Or  . bisacodyl  10 mg Rectal Daily  . chlorhexidine gluconate (MEDLINE KIT)  15 mL Mouth Rinse BID  . Chlorhexidine Gluconate Cloth  6 each Topical Daily  . docusate sodium  200 mg Oral Daily  . enoxaparin (LOVENOX) injection  40 mg Subcutaneous QHS  . furosemide  20 mg Intravenous Q6H  . [START ON 05/24/2019]  furosemide  40 mg Oral Daily  . levothyroxine  100 mcg Oral Daily  . mouth rinse  15 mL Mouth Rinse BID  . moving right along book   Does not apply Once  . pantoprazole  40 mg Oral Daily  . [START ON 05/24/2019] potassium chloride  20 mEq Oral Daily  . sodium chloride flush  10-40 mL Intracatheter Q12H  . sodium chloride flush  3 mL Intravenous Q12H  . umeclidinium-vilanterol  1 puff Inhalation Daily   Continuous Infusions: . sodium chloride    . sodium chloride Stopped (05/22/19 2316)  . amiodarone 30 mg/hr (05/23/19 0600)  . cefUROXime (ZINACEF)  IV Stopped (05/22/19 2101)  . lactated ringers    . potassium chloride 10 mEq (05/23/19 0701)   PRN Meds:.sodium chloride, metoprolol tartrate, morphine injection, ondansetron (ZOFRAN) IV, oxyCODONE, sodium chloride flush, sodium chloride flush, traMADol  General appearance: alert, cooperative and no distress Heart: regular rate and rhythm and paced Lungs: clear to auscultation bilaterally Abdomen: soft, non-tender; bowel sounds normal; no masses,  no organomegaly Extremities: edema trace Wound: clean and dry, aquacel remains in place on sternotoy  Lab Results: CBC: Recent Labs    05/22/19 1714 05/23/19 0433  WBC 11.2* 10.1  HGB 8.5* 7.9*  HCT 25.7* 23.9*  PLT 83* 81*   BMET:  Recent  Labs    05/22/19 1714 05/23/19 0433  NA 138 137  K 3.8 3.5  CL 109 107  CO2 20* 21*  GLUCOSE 139* 114*  BUN 37* 34*  CREATININE 1.27* 1.30*  CALCIUM 7.4* 7.5*    CMET: Lab Results  Component Value Date   WBC 10.1 05/23/2019   HGB 7.9 (L) 05/23/2019   HCT 23.9 (L) 05/23/2019   PLT 81 (L) 05/23/2019   GLUCOSE 114 (H) 05/23/2019   ALT 11 05/18/2019   AST 24 05/18/2019   NA 137 05/23/2019   K 3.5 05/23/2019   CL 107 05/23/2019   CREATININE 1.30 (H) 05/23/2019   BUN 34 (H) 05/23/2019   CO2 21 (L) 05/23/2019   INR 1.7 (H) 05/21/2019   HGBA1C 5.6 05/18/2019      PT/INR:  Recent Labs    05/21/19 1530  LABPROT 19.7*  INR 1.7*    Radiology: No results found.   Assessment/Plan: S/P Procedure(s) (LRB): CORONARY ARTERY BYPASS GRAFTING (CABG) x Four , using left internal mammary artery and right leg greater saphenous vein harvested endoscopically (N/A) AORTIC VALVE REPLACEMENT (AVR) using Inspiris Valve size 58m (N/A) TRANSESOPHAGEAL ECHOCARDIOGRAM (TEE) (N/A)  1. CV- Sinus BLoletha Grayer currently paced at 70- on IV amiodarone drip, will convert to oral Amiodarone later this evening vs. Tomorrow, off milrinone, Coox is at 51 2. Pulm- underlying COPD, CT output has been low, CXR w/o effusions/pneumothorax, mild atelectasis..Marland Kitchenwill d/c chest tubes today, continue aggressive use of IS 3. Renal- creatinine 1.30, weight is down 2 lbs, gentle diuretics ordered, K is WNL 4. Expected post operative blood loss anemia, Hgb down to 7.9 from 8.5, will monitor, start iron supplementation.. no active signs of bleeding 5. Expected post operative thrombocytopenia, remains low but stable, monitor 6. CBGs remain controlled, patient is not a diabetic, continue SSIP 7. Dispo- patient stable, sinus brady under pacer, d/c chest tubes today, continue aggressive pulm toilet, watch Hgb, continue current care     Dennis Gomez 05/23/2019 7:40 AM    I have seen and examined the patient and agree with the assessment and plan as outlined.  Will transfuse 1 unit PRBC's for acute blood loss anemia.  Convert amiodarone to oral.  D/C tubes and mobilize.  Diuresis.  Transfer 4E  CRexene Alberts MD 05/23/2019 9:49 AM

## 2019-05-23 NOTE — Progress Notes (Signed)
      [address]       [place] [address]             [phone]     POD # 2 AVR, CABG x 4  Up in chair  BP (!) 141/90   Pulse 80   Temp 97.6 F (36.4 C) (Oral)   Resp (!) 21   Ht 5\' 5"  (1.651 m)   Wt 68.2 kg   SpO2 (!) 83%   BMI 25.02 kg/m    Intake/Output Summary (Last 24 hours) at 05/23/2019 1801 Last data filed at 05/23/2019 1715 Gross per 24 hour  Intake 1351.66 ml  Output 1245 ml  Net 106.66 ml   Awaiting bed on 4E  Paulette Rockford C. Roxan Hockey, MD [facility] [phone]

## 2019-05-23 NOTE — Telephone Encounter (Signed)
New message   Per Daune Perch scheduled a TOC appointment with Melina Copa on 06/05/19 at 9:15 am.

## 2019-05-23 NOTE — Discharge Summary (Signed)
Physician Discharge Summary  Patient ID: Dennis Gomez MRN: [id number] DOB/AGE: 1950-11-20 68 y.o.  Admit date: 05/16/2019 Discharge date: 05/27/2019  Admission Diagnoses:  Patient Active Problem List   Diagnosis Date Noted  . Severe aortic regurgitation   . Chronic systolic congestive heart failure ([place])   . Essential hypertension   . Hyperlipidemia LDL goal <70   . Chronic kidney disease (CKD), stage III (moderate) (H[facility])   . Coronary artery disease involving native coronary artery of native heart with angina pectoris ([place])   . Tobacco abuse   . Acute kidney injury ([place]) 05/17/2019  . Hypothyroidism 05/16/2019  . Aortic regurgitation 05/16/2019  . COPD (chronic obstructive pulmonary disease) ([place]) 05/16/2019  . Chest pain 05/16/2019   Discharge Diagnoses:   Patient Active Problem List   Diagnosis Date Noted  . S/P aortic valve replacement with bioprosthetic valve + CABGx4 05/21/2019  . S/P CABG x 4 05/21/2019  . Severe aortic regurgitation   . Chronic systolic congestive heart failure ([place])   . Essential hypertension   . Hyperlipidemia LDL goal <70   . Chronic kidney disease (CKD), stage III (moderate) (HCC)   . Coronary artery disease involving native coronary artery of native heart with angina pectoris ([place])   . Tobacco abuse   . Acute kidney injury ([place]) 05/17/2019  . Hypothyroidism 05/16/2019  . Aortic regurgitation 05/16/2019  . COPD (chronic obstructive pulmonary disease) ([place]) 05/16/2019  . Chest pain 05/16/2019   Discharged Condition: good  History of Present Illness:  Mr. Dennis Gomez is a 68 yo white male with no previous cardiac history who has been referred for surgical consultation to discuss treatment options for management of severe aortic insufficiency and multivessel coronary artery disease with unstable angina pectoris.  The patient denies any known history of heart disease, heart murmur, or history of rheumatic fever.  He has a long history of tobacco  abuse, continues to smoke 1 pack cigarettes daily and has been diagnosed with COPD.  He denies any known history of hyperlipidemia or strong family history of coronary artery disease.  He states that for the past year or more he has been experiencing substernal chest discomfort with physical exertion.  Symptoms have progressed recently and occasionally occur when he bends over at the waist or stands up.  Symptoms are always relieved by rest.  More recently symptoms have occurred following meals and become associated with diaphoresis.  He denies any associated symptoms of exertional shortness of breath, resting shortness of breath, PND, or orthopnea.  He has recently developed some mild lower extremity edema.  He presented to [facility], 2019 with complaints of similar chest pain.  Troponin levels were reportedly minimally elevated but BNP level is elevated 17,900.  Echocardiogram there revealed dilated left ventricle with moderate to severe left ventricular systolic dysfunction, ejection fraction estimated 35 to 40%.  He was noted to have severe aortic insufficiency.  He was transferred to [facility] for further management.  Subsequent transthoracic echocardiogram confirmed the presence of moderate to severe left ventricular chamber enlargement and global left ventricular systolic dysfunction.  There was moderate to severe aortic insufficiency.  Left and right heart catheterization performed by Dr. Burt Knack revealed multivessel coronary artery disease with normal right heart pressures and cardiac output.  Cardiothoracic surgical consultation was requested.  Hospital Course:   He was evaluated by Dr. Roxy Manns.  He remained chest pain free during his hospital stay.  It was felt the patient would benefit from coronary bypass  grafting and aortic valve replacement.  The risks and benefits of the procedure were explained to the patient and he was agreeable to proceed.  He was taken to the  operating room on 05/21/2019.  He underwent CABG x 4 utilizing LIMA to LAD, SVG to Diagonal, SVG to OM, and SVG to Left PDA.  He underwent Aortic Valve Replacement with a 25 mm Edwards Insipiris Resilia Stented Valve.  He also underwent endoscopic harvest of greater saphenous vein from right leg.  He tolerated the procedure without difficulty and was taken to the SICU in stable condition.  He was extubated the evening of surgery.  During his stay in the SICU the patient was weaned off Milrinone and Neo-synephrine as tolerated.  His chest tubes and arterial lines were removed without difficulty.  He was transfused a unit of packed cells for expected post operative blood loss anemia.  He developed brief episodes of Atrial Fibrillation.  He was started on IV Amiodarone for this.  He converted to Sinus Bradycardia and was transitioned to an oral regimen of Amiodarone.  He was felt stable for transfer to the progressive care unit on 05/23/2019.  He continued to make progress.  He remained in Sinus Bradycardia.  His pacing wires were removed without difficulty.  He has been unable to weaned from oxygen.  Home use has been arranged.  He was hypertensive and started on Cozaar for better control.  He has expected post operative thrombocytopenia which remained stable.  He is ambulating independently.  His incisions are healing without evidence of infection.  He is felt to be medically stable for discharge home today.      Significant Diagnostic Studies:   Cardiac graphics:   Echocardiogram:  1. The left ventricle has mildly reduced systolic function, with an ejection fraction of 45-50%. The cavity size was mild to moderately dilated. There is mild concentric left ventricular hypertrophy. Left ventricular diastolic Doppler parameters are  indeterminate. Left ventricular diffuse hypokinesis.  2. Diffuse hypokinesis in basal to mid segments, slightly improved at apex. In multiple coronary territories.  3. The right  ventricle has normal systolic function. The cavity was normal. There is no increase in right ventricular wall thickness. Right ventricular systolic pressure could not be assessed.  4. Left atrial size was mildly dilated.  5. Right atrial size was mildly dilated.  6. Trivial pericardial effusion is present.  7. The mitral valve is abnormal.  8. The aortic valve is abnormal. Aortic valve regurgitation is moderate to severe by color flow Doppler.  9. Eccentric aortic regurgitation seen, affecting mitral valve without clear preclosure. Likely at least moderate and possibly severe regurgitation. 10. The aorta is abnormal in size and structure. 11. The aortic arch is normal in size and structure. 12. There is mild dilatation of the aortic root and of the ascending aorta measuring 40 mm.  Angiography:   Left Main  The vessel exhibits minimal luminal irregularities. Patent vessel without significant stenosis  Left Anterior Descending  Prox LAD to Mid LAD lesion 80% stenosed  Prox LAD to Mid LAD lesion is 80% stenosed. The lesion is eccentric. there is severe proximal LAD stenosis involving the first diagonal with disease extending beyond the diagonal branch with a very tight ostial diagonal stenosis  First Diagonal Branch  1st Diag lesion 80% stenosed  1st Diag lesion is 80% stenosed.  Ramus Intermedius  Vessel is small.  Ramus lesion 80% stenosed  Ramus lesion is 80% stenosed. Tiny vessel not amenable to revascularization  Left Circumflex  Prox Cx to Mid Cx lesion 40% stenosed  Prox Cx to Mid Cx lesion is 40% stenosed.  Dist Cx lesion 70% stenosed with side branch in LPAV 70% stenosed  Dist Cx lesion is 70% stenosed with 70% stenosed side branch in LPAV. diffuse disease is present in the distal AV circumflex at the origin of the PLA and PDA branches (dominant LCx)  First Obtuse Marginal Branch  1st Mrg-1 lesion 75% stenosed  1st Mrg-1 lesion is 75% stenosed. moderate caliber vessel, likely  suitable for grafting  1st Mrg-2 lesion 80% stenosed  1st Mrg-2 lesion is 80% stenosed.  Left Posterior Descending Artery  LPDA lesion 75% stenosed  LPDA lesion is 75% stenosed.  Right Coronary Artery  Vessel is small. No significant disease. Small vessel.   Treatments: surgery:    Aortic Valve Replacement             Edwards Inspiris Resilia Stented Bovine Pericardial Tissue Valve (size 25mm, model # 11500A, serial # [id number])             Teflon felt plication of aortic root   Coronary Artery Bypass Grafting x 4             Left Internal Mammary Artery to Distal Left Anterior Descending Coronary Artery             Saphenous Vein Graft to Left Posterior Descending Coronary Artery             Saphenous Vein Graft to Obtuse Marginal Branch of Left Circumflex Coronary   Discharge Exam: Blood pressure (!) 148/86, pulse 68, temperature 97.8 F (36.6 C), temperature source Oral, resp. rate 20, height 5\' 5"  (1.651 m), weight 66.1 kg, SpO2 95 %.  General appearance: alert, cooperative and no distress Heart: regular rate and rhythm Lungs: clear to auscultation bilaterally Abdomen: soft, non-tender; bowel sounds normal; no masses,  no organomegaly Extremities: edema trace Wound: clean and dry, ecchymosis RLE  Disposition:    Discharge Medications:   The patient has been discharged on:   1.Beta Blocker:  Yes [   ]                              No   [ x  ]                              If No, reason: Bradycardia  2.Ace Inhibitor/ARB: Yes [ x  ]                                     No  [    ]                                     If No, reason:  3.Statin:   Yes [ x  ]                  No  [   ]                  If No, reason:  4.Ecasa:  Yes  [ x  ]                  No   [   ]  If No, reason:     Discharge Instructions    Amb Referral to Cardiac Rehabilitation   Complete by: As directed    To [place]   Diagnosis:  CABG Valve Replacement     Valve:  Aortic   CABG X ___: 4   After initial evaluation and assessments completed: Virtual Based Care may be provided alone or in conjunction with Phase 2 Cardiac Rehab based on patient barriers.: Yes     Allergies as of 05/27/2019   No Known Allergies     Medication List    TAKE these medications   amiodarone 200 MG tablet Commonly known as: PACERONE Take 1 tablet (200 mg total) by mouth 2 (two) times daily after a meal. X 7 days, then decrease to 200 mg daily   aspirin 325 MG EC tablet Take 1 tablet (325 mg total) by mouth daily.   atorvastatin 80 MG tablet Commonly known as: LIPITOR Take 1 tablet (80 mg total) by mouth daily at 6 PM.   dextromethorphan-guaiFENesin 30-600 MG 12hr tablet Commonly known as: MUCINEX DM Take 1 tablet by mouth 2 (two) times daily.   ferrous fumarate-b12-vitamic C-folic acid capsule Commonly known as: TRINSICON / FOLTRIN Take 1 capsule by mouth 3 (three) times daily after meals.   furosemide 40 MG tablet Commonly known as: LASIX Take 1 tablet (40 mg total) by mouth daily.   levothyroxine 100 MCG tablet Commonly known as: SYNTHROID Take 100 mcg by mouth daily.   losartan 25 MG tablet Commonly known as: COZAAR Take 1 tablet (25 mg total) by mouth daily.   spironolactone 25 MG tablet Commonly known as: ALDACTONE Take 25 mg by mouth daily.   traMADol 50 MG tablet Commonly known as: ULTRAM Take 1-2 tablets (50-100 mg total) by mouth every 4 (four) hours as needed for moderate pain.   Trelegy Ellipta 100-62.5-25 MCG/INH Aepb Generic drug: Fluticasone-Umeclidin-Vilant Take 1 puff by mouth daily.            Durable Medical Equipment  (From admission, onward)         Start     Ordered   05/27/19 0751  For home use only DME oxygen  Once    Comments: If unable to wean off today  Question Answer Comment  Length of Need 6 Months   Mode or (Route) Nasal cannula   Liters per Minute 2   Oxygen delivery system Gas      05/26/19 0751    05/26/19 0751  For home use only DME Walker rolling  Once    Question:  Patient needs a walker to treat with the following condition  Answer:  Physical deconditioning   05/26/19 0751         Follow-up Information    Dunn, Dayna N, PA-C Follow up.   Specialties: Cardiology, Radiology Why: Cardiology hospital follow-up with the PA for Dr. [name] on 06/05/2019 at 9:15 AM.  Please arrive 15 minutes early for check-in. Contact information: [address] [place] [address] [phone]        [facility] [place] Follow up on 06/17/2019.   Specialty: Cardiothoracic Surgery Why: Appointment is at 1:00, please get CX[facility] at 12:30 at [facility] located on first floor of our office building Contact information: [address], [address] [place] [id number] [phone]          Signed:  Lowella Dandyrin Barrett, PA-C 05/27/2019, 7:37 AM

## 2019-05-23 NOTE — Progress Notes (Signed)
Pt arrived from [facility] Vital stable. Pt on 3 lpm via [place]. Telebox 2 applied/ccmd notified. CHG bath was given prior to tx to 4E. Call bell within reach, pt oriented to room. Will continue to monitor.  Jerald Kief, RN

## 2019-05-23 NOTE — Progress Notes (Signed)
Progress Note  Patient Name: Dennis Gomez Date of Encounter: 05/23/2019   Primary Cardiologist: Lauree Chandler, MD (New Patient)  Subjective   Sitting in chair eating breakfast   Inpatient Medications    Scheduled Meds: . acetaminophen  1,000 mg Oral Q6H  . aspirin EC  325 mg Oral Daily  . atorvastatin  80 mg Oral q1800  . bisacodyl  10 mg Oral Daily   Or  . bisacodyl  10 mg Rectal Daily  . chlorhexidine gluconate (MEDLINE KIT)  15 mL Mouth Rinse BID  . Chlorhexidine Gluconate Cloth  6 each Topical Daily  . docusate sodium  200 mg Oral Daily  . enoxaparin (LOVENOX) injection  40 mg Subcutaneous QHS  . ferrous [id number] C-folic acid  1 capsule Oral TID PC  . furosemide  20 mg Intravenous Q6H  . [START ON 05/24/2019] furosemide  40 mg Oral Daily  . levothyroxine  100 mcg Oral Daily  . mouth rinse  15 mL Mouth Rinse BID  . moving right along book   Does not apply Once  . pantoprazole  40 mg Oral Daily  . [START ON 05/24/2019] potassium chloride  20 mEq Oral Daily  . sodium chloride flush  10-40 mL Intracatheter Q12H  . sodium chloride flush  3 mL Intravenous Q12H  . umeclidinium-vilanterol  1 puff Inhalation Daily   Continuous Infusions: . sodium chloride    . sodium chloride Stopped (05/22/19 2316)  . amiodarone 30 mg/hr (05/23/19 0600)  . cefUROXime (ZINACEF)  IV Stopped (05/22/19 2101)  . lactated ringers    . potassium chloride 10 mEq (05/23/19 0701)   PRN Meds: sodium chloride, metoprolol tartrate, morphine injection, ondansetron (ZOFRAN) IV, oxyCODONE, sodium chloride flush, sodium chloride flush, traMADol   Vital Signs    Vitals:   05/23/19 0400 05/23/19 0500 05/23/19 0600 05/23/19 0819  BP: 118/76 114/84 115/75   Pulse: 69 86 69   Resp: 15 (!) 21 20   Temp: (!) 97.5 F (36.4 C)     TempSrc: Oral     SpO2: 100% 91% 96% 100%  Weight:  68.2 kg    Height:        Intake/Output Summary (Last 24 hours) at 05/23/2019 0821 Last data filed at  05/23/2019 0600 Gross per 24 hour  Intake 2430.19 ml  Output 1520 ml  Net 910.19 ml   Last 3 Weights 05/23/2019 05/22/2019 05/21/2019  Weight (lbs) 150 lb 5.7 oz 152 lb 5.4 oz 128 lb 12.8 oz  Weight (kg) 68.2 kg 69.1 kg 58.423 kg      Telemetry    PAF rates 80-95 bpm  ECG    Afib ICLBBB rate 94   Physical Exam   Post sternotomy SEM Abdomen soft  Chest tubes  No edema PT/DP palpable   Labs    High Sensitivity Troponin:  No results for input(s): TROPONINIHS in the last 720 hours.    Cardiac EnzymesNo results for input(s): TROPONINI in the last 168 hours. No results for input(s): TROPIPOC in the last 168 hours.   Chemistry Recent Labs  Lab 05/17/19 0436  05/18/19 0328  05/22/19 0413 05/22/19 1714 05/23/19 0433  NA 139   < > 140   < > 142 138 137  K 3.7   < > 3.7   < > 3.9 3.8 3.5  CL 100  --  102   < > 114* 109 107  CO2 27  --  25   < > 19* 20* 21*  GLUCOSE 83  --  82   < > 106* 139* 114*  BUN 36*  --  42*   < > 35* 37* 34*  CREATININE 1.46*  --  1.37*   < > 1.13 1.27* 1.30*  CALCIUM 10.1  --  9.5   < > 7.5* 7.4* 7.5*  PROT 8.0  --  7.2  --   --   --   --   ALBUMIN 4.2  --  3.8  --   --   --   --   AST 27  --  24  --   --   --   --   ALT 14  --  11  --   --   --   --   ALKPHOS 65  --  59  --   --   --   --   BILITOT 0.9  --  1.0  --   --   --   --   GFRNONAA 50*  --  54*   < > >60 59* 57*  GFRAA 58*  --  >60   < > >60 >60 >60  ANIONGAP 12  --  13   < > _0 < > = values in this interval not displayed.     Hematology Recent Labs  Lab 05/22/19 0413 05/22/19 1714 05/23/19 0433  WBC 10.4 11.2* 10.1  RBC 2.92* 2.76* 2.59*  HGB 9.1* 8.5* 7.9*  HCT 26.9* 25.7* 23.9*  MCV 92.1 93.1 92.3  MCH 31.2 30.8 30.5  MCHC 33.8 33.1 33.1  RDW 17.2* 17.7* 17.5*  PLT 91* 83* 81*    BNPNo results for input(s): BNP, PROBNP in the last 168 hours.   DDimer No results for input(s): DDIMER in the last 168 hours.   Radiology    Dg Chest Port 1 View  Result Date:  05/22/2019 CLINICAL DATA:  68 year old male with a history of cardiac surgery EXAM: PORTABLE CHEST 1 VIEW COMPARISON:  May 21, 2019 FINDINGS: Cardiomediastinal silhouette unchanged with surgical changes of median sternotomy, CABG, aortic valve repair. Interval removal of the endotracheal tube and gastric tube. Unchanged position of right IJ sheath which transmits Swan-Ganz catheter, with the tip terminating in the region of the main pulmonary artery. Mediastinal/pleural drains unchanged. Epicardial pacing leads in place. No visualized pneumothorax.  No pleural effusion. Low lung volumes with reticular opacities/architectural distortion of the lungs and no evidence of interlobular septal thickening or confluent airspace disease. IMPRESSION: Interval extubation and removal of the gastric tube. Unchanged mediastinal/pleural drains.  No visualized pneumothorax. Low lung volumes and chronic lung changes. Right IJ sheath and Swan-Ganz catheter unchanged. Surgical changes of median sternotomy, CABG, aortic valve repair. Electronically Signed   By: Corrie Mckusick D.O.   On: 05/22/2019 08:27   Dg Chest Port 1 View  Result Date: 05/21/2019 CLINICAL DATA:  Status post aortic valve replacement EXAM: PORTABLE CHEST 1 VIEW COMPARISON:  05/19/2019, 05/17/2009 FINDINGS: Interval intubation, tip of the endotracheal tube is about 3.6 cm superior to the carina. Esophageal tube tip is in the left upper quadrant. Interval sternotomy and aortic valve replacement. Right IJ Swan-Ganz catheter tip projects over pulmonary outflow tract. Midline and left-sided chest drainage catheters. No appreciable pneumothorax. Mild cardiomegaly. No large effusion. Patchy atelectasis at the left base. IMPRESSION: 1. Interval sternotomy and aortic valve replacement. Endotracheal tube tip about 3.6 cm superior to carina. Placement of additional support lines and tubes as above 2. Patchy atelectasis at the  left base. 3. Mild cardiomegaly Electronically  Signed   By: Donavan Foil M.D.   On: 05/21/2019 15:49    Cardiac Studies   Echocardiogram 05/17/2019: Impressions: 1. The left ventricle has mildly reduced systolic function, with an ejection fraction of 45-50%. The cavity size was mild to moderately dilated. There is mild concentric left ventricular hypertrophy. Left ventricular diastolic Doppler parameters are  indeterminate. Left ventricular diffuse hypokinesis.  2. Diffuse hypokinesis in basal to mid segments, slightly improved at apex. In multiple coronary territories.  3. The right ventricle has normal systolic function. The cavity was normal. There is no increase in right ventricular wall thickness. Right ventricular systolic pressure could not be assessed.  4. Left atrial size was mildly dilated.  5. Right atrial size was mildly dilated.  6. Trivial pericardial effusion is present.  7. The mitral valve is abnormal.  8. The aortic valve is abnormal. Aortic valve regurgitation is moderate to severe by color flow Doppler.  9. Eccentric aortic regurgitation seen, affecting mitral valve without clear preclosure. Likely at least moderate and possibly severe regurgitation. 10. The aorta is abnormal in size and structure. 11. The aortic arch is normal in size and structure. 12. There is mild dilatation of the aortic root and of the ascending aorta measuring 40 mm. 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 Bi- cuspid AV with no stenosis severe AR by cath / echo 2. Mild ascending aortic root enlargement 4.0 cm with moderate calcific atherosclerosis 3.  Bovine Arch 4. Severe CAD  involving the proximal LAD, D1 and OM1 with left dominant circulation and small RCA   Patient Profile   Mr. Pollio is a 68 y.o. male with a history of COPD, hypothyroidism, and tobacco abuse who presented to [facility] on 05/14/2019 for further evaluation of chest pain. Echo showed severely dilated LV with EF of 35-40% and moderate to severe aortic regurgitation. Troponin mildly elevated. Patient was transferred to [facility] for further evaluation. Patient found to have severe multivessel CAD and CABG/AVR done 05/20/19   Assessment & Plan    Post AVR/CABG for severe AR bicuspid valve and CAD. 25 mm Edwards INspiris Resilia Stented bovine pericardial valve with LIMA to LAD, SVG to PDA and SVG to OM Pre op EF 45-50% but intra operative TEE indicated 60-65% ???  Agree with amiodarone for PAF Coox improved 52% off milrinone Currently telemetry is A pacing no PAF Hct down to 23.9 consider transfusion per CVTS   Jenkins Rouge

## 2019-05-24 ENCOUNTER — Inpatient Hospital Stay (HOSPITAL_COMMUNITY): Payer: Medicare Other

## 2019-05-24 ENCOUNTER — Encounter (HOSPITAL_COMMUNITY): Payer: Self-pay | Admitting: Thoracic Surgery (Cardiothoracic Vascular Surgery)

## 2019-05-24 DIAGNOSIS — Z953 Presence of xenogenic heart valve: Secondary | ICD-10-CM

## 2019-05-24 LAB — TYPE AND SCREEN
ABO/RH(D): A POS
Antibody Screen: NEGATIVE
Unit division: 0

## 2019-05-24 LAB — BASIC METABOLIC PANEL
Anion gap: 10 (ref 5–15)
BUN: 35 mg/dL — ABNORMAL HIGH (ref 8–23)
CO2: 20 mmol/L — ABNORMAL LOW (ref 22–32)
Calcium: 7.8 mg/dL — ABNORMAL LOW (ref 8.9–10.3)
Chloride: 108 mmol/L (ref 98–111)
Creatinine, Ser: 1.2 mg/dL (ref 0.61–1.24)
GFR calc Af Amer: >60 mL/min (ref >60)
GFR calc non Af Amer: >60 mL/min (ref >60)
Glucose, Bld: 95 mg/dL (ref 70–99)
Potassium: 4.3 mmol/L (ref 3.5–5.1)
Sodium: 138 mmol/L (ref 135–145)

## 2019-05-24 LAB — BPAM RBC
Blood Product Expiration Date: 202008242359
ISSUE DATE / TIME: 202008061507
Unit Type and Rh: 6200

## 2019-05-24 LAB — CBC
HCT: 27.5 % — ABNORMAL LOW (ref 39.0–52.0)
Hemoglobin: 9.2 g/dL — ABNORMAL LOW (ref 13.0–17.0)
MCH: 30.6 pg (ref 26.0–34.0)
MCHC: 33.5 g/dL (ref 30.0–36.0)
MCV: 91.4 fL (ref 80.0–100.0)
Platelets: 82 10*3/uL — ABNORMAL LOW (ref 150–400)
RBC: 3.01 MIL/uL — ABNORMAL LOW (ref 4.22–5.81)
RDW: 17.2 % — ABNORMAL HIGH (ref 11.5–15.5)
WBC: 6.2 10*3/uL (ref 4.0–10.5)
nRBC: 0.3 % — ABNORMAL HIGH (ref 0.0–0.2)

## 2019-05-24 LAB — GLUCOSE, CAPILLARY: Glucose-Capillary: 95 mg/dL (ref 70–99)

## 2019-05-24 MED ORDER — ALBUTEROL SULFATE (2.5 MG/3ML) 0.083% IN NEBU: 2.5000 mg | INHALATION_SOLUTION | RESPIRATORY_TRACT | Status: DC | PRN

## 2019-05-24 MED ORDER — LOSARTAN POTASSIUM 25 MG PO TABS
25.0000 mg | ORAL_TABLET | Freq: Every day | ORAL | Status: DC
  Administered 2019-05-24 – 2019-05-27 (×4): 25 mg via ORAL
  Filled 2019-05-24 (×4): qty 1

## 2019-05-24 MED ORDER — SPIRONOLACTONE 25 MG PO TABS
25.0000 mg | ORAL_TABLET | Freq: Every day | ORAL | Status: DC
  Administered 2019-05-24 – 2019-05-27 (×4): 25 mg via ORAL
  Filled 2019-05-24 (×4): qty 1

## 2019-05-24 NOTE — Telephone Encounter (Signed)
**Note De-Identified Colandra Ohanian Obfuscation** There is a discharge summary in the pts chart that indicates he was discharged yesterday. I called the pts home phone and s/w a lady that advised me the pt is still in the hospital and that it is her understanding that he will be there until at least Monday 8/10.  We will monitor the pts chart and call him once discharged.

## 2019-05-24 NOTE — Progress Notes (Signed)
CARDIAC REHAB PHASE I   PRE:  Rate/Rhythm: 65 first deg    BP: lying 118/75    SaO2: 95 2L, 85 RA, slow to increase back to 91 2L  MODE:  Ambulation: 190 ft   POST:  Rate/Rhythm: 82 SR    BP: sitting 147/79     SaO2: 92 2L  Pt in bed on my arrival. ? HOH as his responses did not line up with my conversation. Attempted RA however pt dropped to 85 RA after a few minutes and then took >5 min on 2L to increase again. Pt needed mod assist to move to EOB and then stand. Ambulated with RW and 2L. Significant weakness, esp legs. Anterior lean and shuffled feet somewhat, esp on turns. Only c/o when asked is "I'm getting tired". Not able to walk very far in hall. Continued anterior lean getting to recliner and needed mod assist to support him. VSS. Pt would benefit from PT order to help strengthen before d/c home with wife (he has 4 boys but they work).  Encouraged x2 more walks today and also IS (1000 mL). Left in recliner on 2L.  [phone]  [place], ACSM 05/24/2019 11:07 AM

## 2019-05-24 NOTE — Care Management Important Message (Signed)
Important Message  Patient Details  Name: Dennis Gomez MRN: [id number] Date of Birth: Mar 12, 1951   Medicare Important Message Given:  Yes     Orbie Pyo 05/24/2019, 3:53 PM

## 2019-05-24 NOTE — Progress Notes (Addendum)
      [address]       [place] [address]             [phone]      3 Days Post-Op Procedure(s) (LRB): CORONARY ARTERY BYPASS GRAFTING (CABG) x Four , using left internal mammary artery and right leg greater saphenous vein harvested endoscopically (N/A) AORTIC VALVE REPLACEMENT (AVR) using Inspiris Valve size 23mm (N/A) TRANSESOPHAGEAL ECHOCARDIOGRAM (TEE) (N/A)   Subjective:  No new complaints.  States he needs his dentures.  Nursing is trying to locate.  + flatus, no BM  Objective: Vital signs in last 24 hours: Temp:  [97.3 F (36.3 C)-99.8 F (37.7 C)] 98.1 F (36.7 C) (08/07 0757) Pulse Rate:  [25-94] 67 (08/07 0345) Cardiac Rhythm: Normal sinus rhythm (08/06 1828) Resp:  [13-26] 17 (08/07 0345) BP: (109-154)/(74-98) 133/74 (08/07 0757) SpO2:  [24 %-100 %] 92 % (08/07 0749) Weight:  [68.7 kg] 68.7 kg (08/07 0345)  Intake/Output from previous day: 08/06 0701 - 08/07 0700 In: 938.6 [P.O.:150; I.V.:82.6; Blood:356.7; IV Piggyback:349.3] Out: 885 [Urine:725; Chest Tube:160]  General appearance: alert, cooperative and no distress Heart: regular rate and rhythm Lungs: clear to auscultation bilaterally Abdomen: soft, non-tender; bowel sounds normal; no masses,  no organomegaly Extremities: edema trace Wound: clean and dry  Lab Results: Recent Labs    05/23/19 0433 05/24/19 0313  WBC 10.1 6.2  HGB 7.9* 9.2*  HCT 23.9* 27.5*  PLT 81* 82*   BMET:  Recent Labs    05/23/19 0433 05/24/19 0313  NA 137 138  K 3.5 4.3  CL 107 108  CO2 21* 20*  GLUCOSE 114* 95  BUN 34* 35*  CREATININE 1.30* 1.20  CALCIUM 7.5* 7.8*    PT/INR:  Recent Labs    05/21/19 1530  LABPROT 19.7*  INR 1.7*   ABG    Component Value Date/Time   PHART 7.387 05/21/2019 2231   HCO3 18.6 (L) 05/21/2019 2231   TCO2 20 (L) 05/21/2019 2231   ACIDBASEDEF 6.0 (H) 05/21/2019 2231   O2SAT 51.5 05/23/2019 0415   CBG (last 3)  Recent Labs    05/23/19 0402 05/23/19  0828 05/23/19 1225  GLUCAP 104* 73 90    Assessment/Plan: S/P Procedure(s) (LRB): CORONARY ARTERY BYPASS GRAFTING (CABG) x Four , using left internal mammary artery and right leg greater saphenous vein harvested endoscopically (N/A) AORTIC VALVE REPLACEMENT (AVR) using Inspiris Valve size 77mm (N/A) TRANSESOPHAGEAL ECHOCARDIOGRAM (TEE) (N/A)  1.PAF/ Sinus Bradycardia, + HTN- continue Amiodarone, will add low dose Cozaar 2. Pulm- weaning oxygen as tolerated, CXR is free of pneumothorax, continue IS 3. Renal- creatinine has been stable, weight is trending down continue Lasix, potassium 4. Expected post operative blood loss anemia, Hgb up to 9.2 after transfusion 5. Expected post operative thrombocytopenia- plt count remains low but stable, monitor 6. Dispo- patient stable, add cozaar for BP control, continue diuretics, Hgb stable after transfusion, continue current care   LOS: 7 days    Erin Barrett 05/24/2019  I have seen and examined the patient and agree with the assessment and plan as outlined.  Making good progress.  Maintaining NSR w/ HR 60's.  Continue amiodarone.  Start ARB and continue to hold beta blockers.  Possibly ready for d/c home 1-2 days.  Rexene Alberts, MD 05/24/2019 8:39 AM

## 2019-05-25 MED ORDER — DM-GUAIFENESIN ER 30-600 MG PO TB12
1.0000 | ORAL_TABLET | Freq: Two times a day (BID) | ORAL | Status: DC
  Administered 2019-05-25 – 2019-05-27 (×5): 1 via ORAL
  Filled 2019-05-25 (×6): qty 1

## 2019-05-25 NOTE — Progress Notes (Signed)
Pt ambulated in hallway about 480 ft. Tried room air, dropped to mid 80s, came back up to mid 90s on 2L. Pt tolerated walk well. Assisted back to bed.

## 2019-05-25 NOTE — Progress Notes (Signed)
EPW removed per order. Tips intact. VSS. Pt education on 1 hour bedrest. Call light in reach.  Clyde Canterbury, RN

## 2019-05-25 NOTE — Progress Notes (Signed)
CARDIAC REHAB PHASE I   PRE:  Rate/Rhythm: 75 first deg    BP: sitting 135/87    SaO2: 94 2L  MODE:  Ambulation: 390 ft   POST:  Rate/Rhythm: 87 SR    BP: sitting 152/88     SaO2: 90 2L  Pt somewhat stronger today. Did not need as much assistance and able to increase distance. He sts his calves hurt with walking, this has been a problem for a long time. Needs RW for home. He will probably need O2 but should be tested closer to d/c. 1000 mL on IS.  His wife is here and I discussed ed with pt and wife. Discussed sternal precautions, IS, exercise, smoking cessation, diet, and CRPII. He voiced understanding but does not seem very interested in quitting smoking. Discussed the ramifications. Will refer to [place]. Encouraged more walking and IS. [phone]  Darrick Meigs CES, ACSM 05/25/2019 2:34 PM

## 2019-05-25 NOTE — Progress Notes (Signed)
PT Cancellation Note  Patient Details Name: Dennis Gomez MRN: [id number] DOB: 10-Mar-1951   Cancelled Treatment:    Reason Eval/Treat Not Completed: Patient declined, no reason specified.  Pt just in bed after his 3rd trial of ambulation today.  Not wanting to do anymore.  Will see 8/9 as able. 05/25/2019  Donnella Sham, [place] [phone]  (pager) [phone]  (office)   Tessie Fass Kendrew Paci 05/25/2019, 4:50 PM

## 2019-05-25 NOTE — Progress Notes (Signed)
[address]       [place] [address]             [phone]      4 Days Post-Op Procedure(s) (LRB): CORONARY ARTERY BYPASS GRAFTING (CABG) x Four , using left internal mammary artery and right leg greater saphenous vein harvested endoscopically (N/A) AORTIC VALVE REPLACEMENT (AVR) using Inspiris Valve size 30m (N/A) TRANSESOPHAGEAL ECHOCARDIOGRAM (TEE) (N/A) Subjective: Feels ok, some mildly productive cough  Objective: Vital signs in last 24 hours: Temp:  [98 F (36.7 C)-98.5 F (36.9 C)] 98.1 F (36.7 C) (08/08 0427) Pulse Rate:  [64-73] 64 (08/08 0427) Cardiac Rhythm: Heart block;Bundle branch block (08/07 1917) Resp:  [18-25] 18 (08/08 0427) BP: (116-135)/(59-86) 131/86 (08/08 0427) SpO2:  [92 %-96 %] 95 % (08/08 0427) Weight:  [67.4 kg] 67.4 kg (08/08 0427)  Hemodynamic parameters for last 24 hours:    Intake/Output from previous day: 08/07 0701 - 08/08 0700 In: 360 [P.O.:360] Out: 1055 [Urine:1055] Intake/Output this shift: No intake/output data recorded.  General appearance: alert, cooperative and no distress Heart: regular rate and rhythm Lungs: coarse BS throghout Abdomen: soft, non-tender Extremities: no edema Wound: incis healing well, no signs of infection  Lab Results: Recent Labs    05/23/19 0433 05/24/19 0313  WBC 10.1 6.2  HGB 7.9* 9.2*  HCT 23.9* 27.5*  PLT 81* 82*   BMET:  Recent Labs    05/23/19 0433 05/24/19 0313  NA 137 138  K 3.5 4.3  CL 107 108  CO2 21* 20*  GLUCOSE 114* 95  BUN 34* 35*  CREATININE 1.30* 1.20  CALCIUM 7.5* 7.8*    PT/INR: No results for input(s): LABPROT, INR in the last 72 hours. ABG    Component Value Date/Time   PHART 7.387 05/21/2019 2231   HCO3 18.6 (L) 05/21/2019 2231   TCO2 20 (L) 05/21/2019 2231   ACIDBASEDEF 6.0 (H) 05/21/2019 2231   O2SAT 51.5 05/23/2019 0415   CBG (last 3)  Recent Labs    05/23/19 0828 05/23/19 1225 05/23/19 1558  GLUCAP 73 90 95    Meds  Scheduled Meds: . acetaminophen  1,000 mg Oral Q6H  . amiodarone  200 mg Oral BID PC  . aspirin EC  325 mg Oral Daily  . atorvastatin  80 mg Oral q1800  . bisacodyl  10 mg Oral Daily   Or  . bisacodyl  10 mg Rectal Daily  . chlorhexidine gluconate (MEDLINE KIT)  15 mL Mouth Rinse BID  . Chlorhexidine Gluconate Cloth  6 each Topical Daily  . docusate sodium  200 mg Oral Daily  . enoxaparin (LOVENOX) injection  40 mg Subcutaneous QHS  . ferrous [id number]-folic acid  1 capsule Oral TID PC  . furosemide  40 mg Oral Daily  . levothyroxine  100 mcg Oral Daily  . losartan  25 mg Oral Daily  . mouth rinse  15 mL Mouth Rinse BID  . pantoprazole  40 mg Oral Daily  . potassium chloride  20 mEq Oral Daily  . sodium chloride flush  10-40 mL Intracatheter Q12H  . sodium chloride flush  3 mL Intravenous Q12H  . spironolactone  25 mg Oral Daily  . umeclidinium-vilanterol  1 puff Inhalation Daily   Continuous Infusions: . sodium chloride    . sodium chloride Stopped (05/22/19 2316)  . lactated ringers     PRN Meds:.sodium chloride, albuterol, metoprolol tartrate, morphine injection, ondansetron (ZOFRAN) IV, oxyCODONE, sodium chloride flush,  sodium chloride flush, traMADol  Xrays Dg Chest 2 View  Result Date: 05/24/2019 CLINICAL DATA:  68 year old male with a history aortic valve replacement EXAM: CHEST - 2 VIEW COMPARISON:  May 23, 2019, May 22, 2019 FINDINGS: Cardiomediastinal silhouette unchanged in size and contour. Surgical changes of median sternotomy, CABG, aortic valve repair. Interval removal of the right IJ sheath. Epicardial pacing leads remain. No visualized pneumothorax. Blunting blunting of the left costophrenic sulcus with meniscus on the lateral view. No interlobular septal thickening. No confluent airspace disease. Patchy ill-defined opacities at the lung bases. IMPRESSION: Basilar atelectasis and small left pleural effusion. Interval removal of right IJ sheath.  Surgical changes of median sternotomy, CABG, aortic valve repair. Epicardial pacing leads remain. Electronically Signed   By: Corrie Mckusick D.O.   On: 05/24/2019 08:58    Assessment/Plan: S/P Procedure(s) (LRB): CORONARY ARTERY BYPASS GRAFTING (CABG) x Four , using left internal mammary artery and right leg greater saphenous vein harvested endoscopically (N/A) AORTIC VALVE REPLACEMENT (AVR) using Inspiris Valve size 1m (N/A) TRANSESOPHAGEAL ECHOCARDIOGRAM (TEE) (N/A)  1 conts to do well POD #4 CABG/AVR 2 hemodyn stable in sinus with 1 deg AVB, cont amio without beta blocker for now, BP ok on Cozaar, may need some titration over time 3 no new labs/CXR today 4 sats good on 2 liters, cont to wean off, pulm toilet/meds, add mucinex, +flutter valve, + smoker 5 cont diuresis for post op volume overload, clinically improving  6 d/c epw's today 7 recheck labs in am 8 poss d/c in am  LOS: 8 days    WJohn GiovanniPMethodist Hospital [facility]8/05/2019 Pager [phone]

## 2019-05-26 LAB — CBC
HCT: 26.3 % — ABNORMAL LOW (ref 39.0–52.0)
Hemoglobin: 8.7 g/dL — ABNORMAL LOW (ref 13.0–17.0)
MCH: 31.4 pg (ref 26.0–34.0)
MCHC: 33.1 g/dL (ref 30.0–36.0)
MCV: 94.9 fL (ref 80.0–100.0)
Platelets: 127 10*3/uL — ABNORMAL LOW (ref 150–400)
RBC: 2.77 MIL/uL — ABNORMAL LOW (ref 4.22–5.81)
RDW: 17 % — ABNORMAL HIGH (ref 11.5–15.5)
WBC: 7 10*3/uL (ref 4.0–10.5)
nRBC: 0 % (ref 0.0–0.2)

## 2019-05-26 LAB — BASIC METABOLIC PANEL
Anion gap: 11 (ref 5–15)
BUN: 26 mg/dL — ABNORMAL HIGH (ref 8–23)
CO2: 21 mmol/L — ABNORMAL LOW (ref 22–32)
Calcium: 8.2 mg/dL — ABNORMAL LOW (ref 8.9–10.3)
Chloride: 109 mmol/L (ref 98–111)
Creatinine, Ser: 0.91 mg/dL (ref 0.61–1.24)
GFR calc Af Amer: >60 mL/min (ref >60)
GFR calc non Af Amer: >60 mL/min (ref >60)
Glucose, Bld: 86 mg/dL (ref 70–99)
Potassium: 3.9 mmol/L (ref 3.5–5.1)
Sodium: 141 mmol/L (ref 135–145)

## 2019-05-26 NOTE — Plan of Care (Signed)
  Problem: Health Behavior/Discharge Planning: Goal: Ability to manage health-related needs will improve Outcome: Progressing   Problem: Clinical Measurements: Goal: Will remain free from infection Outcome: Progressing Goal: Respiratory complications will improve Outcome: Progressing Goal: Cardiovascular complication will be avoided Outcome: Progressing   

## 2019-05-26 NOTE — Progress Notes (Signed)
SATURATION QUALIFICATIONS:  (This note is used to comply with regulatory documentation for home oxygen)    Patient Saturations on Room Air at Rest = 92%  Patient Saturations on Room Air while Ambulating = 87%  Patient Saturations on  oxygen while Ambulating = Did not test  Please briefly explain why patient needs home oxygen: On RA, pt was not able to maintain O2 sats >87% with consistent functional mobility.   Rolinda Roan, PT, DPT Acute Rehabilitation Services Pager: [phone] Office: [phone]

## 2019-05-26 NOTE — Progress Notes (Addendum)
[address]       [place] [address]             [phone]      5 Days Post-Op Procedure(s) (LRB): CORONARY ARTERY BYPASS GRAFTING (CABG) x Four , using left internal mammary artery and right leg greater saphenous vein harvested endoscopically (N/A) AORTIC VALVE REPLACEMENT (AVR) using Inspiris Valve size 77m (N/A) TRANSESOPHAGEAL ECHOCARDIOGRAM (TEE) (N/A) Subjective: Feels pretty well, some cough, no significant SOB, denies pain  Objective: Vital signs in last 24 hours: Temp:  [97.8 F (36.6 C)-98.2 F (36.8 C)] 98.1 F (36.7 C) (08/09 0526) Pulse Rate:  [61-71] 71 (08/09 0531) Cardiac Rhythm: Heart block (08/08 2000) Resp:  [18-23] 23 (08/09 0531) BP: (118-142)/(66-87) 126/72 (08/09 0526) SpO2:  [91 %-96 %] 92 % (08/09 0704) Weight:  [66.6 kg] 66.6 kg (08/09 0531)  Hemodynamic parameters for last 24 hours:    Intake/Output from previous day: 08/08 0701 - 08/09 0700 In: 340 [P.O.:340] Out: 375 [Urine:375] Intake/Output this shift: No intake/output data recorded.  General appearance: alert, cooperative, distracted and no distress Heart: regular rate and rhythm Lungs: mildly dim in bases,more clear today Abdomen: benign Extremities: no edema Wound: incis healing well  Lab Results: Recent Labs    05/24/19 0313 05/26/19 0319  WBC 6.2 7.0  HGB 9.2* 8.7*  HCT 27.5* 26.3*  PLT 82* 127*   BMET:  Recent Labs    05/24/19 0313 05/26/19 0319  NA 138 141  K 4.3 3.9  CL 108 109  CO2 20* 21*  GLUCOSE 95 86  BUN 35* 26*  CREATININE 1.20 0.91  CALCIUM 7.8* 8.2*    PT/INR: No results for input(s): LABPROT, INR in the last 72 hours. ABG    Component Value Date/Time   PHART 7.387 05/21/2019 2231   HCO3 18.6 (L) 05/21/2019 2231   TCO2 20 (L) 05/21/2019 2231   ACIDBASEDEF 6.0 (H) 05/21/2019 2231   O2SAT 51.5 05/23/2019 0415   CBG (last 3)  Recent Labs    05/23/19 0828 05/23/19 1225 05/23/19 1558  GLUCAP 73 90 95    Meds  Scheduled Meds: . acetaminophen  1,000 mg Oral Q6H  . amiodarone  200 mg Oral BID PC  . aspirin EC  325 mg Oral Daily  . atorvastatin  80 mg Oral q1800  . bisacodyl  10 mg Oral Daily   Or  . bisacodyl  10 mg Rectal Daily  . chlorhexidine gluconate (MEDLINE KIT)  15 mL Mouth Rinse BID  . Chlorhexidine Gluconate Cloth  6 each Topical Daily  . dextromethorphan-guaiFENesin  1 tablet Oral BID  . docusate sodium  200 mg Oral Daily  . enoxaparin (LOVENOX) injection  40 mg Subcutaneous QHS  . ferrous [id number]-folic acid  1 capsule Oral TID PC  . furosemide  40 mg Oral Daily  . levothyroxine  100 mcg Oral Daily  . losartan  25 mg Oral Daily  . mouth rinse  15 mL Mouth Rinse BID  . pantoprazole  40 mg Oral Daily  . potassium chloride  20 mEq Oral Daily  . sodium chloride flush  10-40 mL Intracatheter Q12H  . sodium chloride flush  3 mL Intravenous Q12H  . spironolactone  25 mg Oral Daily  . umeclidinium-vilanterol  1 puff Inhalation Daily   Continuous Infusions: . sodium chloride    . sodium chloride Stopped (05/22/19 2316)  . lactated ringers     PRN Meds:.sodium chloride, albuterol, metoprolol tartrate,  morphine injection, ondansetron (ZOFRAN) IV, oxyCODONE, sodium chloride flush, sodium chloride flush, traMADol  Xrays No results found.  Assessment/Plan: S/P Procedure(s) (LRB): CORONARY ARTERY BYPASS GRAFTING (CABG) x Four , using left internal mammary artery and right leg greater saphenous vein harvested endoscopically (N/A) AORTIC VALVE REPLACEMENT (AVR) using Inspiris Valve size 24m (N/A) TRANSESOPHAGEAL ECHOCARDIOGRAM (TEE) (N/A)  1 conts to make progress POD# 5 CABG/AVR 2 hemodyn stable, 1 deg block 3 sats ok on 2-3 liters [place]- cont to push pulm toilet/meds- if unable to wean will probably need home O2- will arrange 4 H/H pretty stable 5 thrombocytopenia improved 6 renal fxn normal, conts to diurese, cont lasix for now but may be able to d/c soon 7 ordered  walker for home use 8 poss d/c in am  LOS: 9 days    WJohn GiovanniPA-C 05/26/2019 Pager [phone]  Agree with above On lasix and aldactone.  Remains +3L since admission signficant smoking hx Otherwise doing well.  Will arrange home O2, and DC home

## 2019-05-26 NOTE — Progress Notes (Signed)
Patient walked 240 ft with minimal assist on 2L O2. Oxygen sats stayed between 90-92% with occasional dip to 89%. Patient returned to the room, denies SOB. Decreased patient's O2 to 1L. Sats remaining 91-92%. Patient tolerated well.

## 2019-05-26 NOTE — Plan of Care (Signed)
  Problem: Pain Managment: Goal: General experience of comfort will improve Outcome: Progressing   Problem: Health Behavior/Discharge Planning: Goal: Ability to manage health-related needs will improve Outcome: Not Progressing   Problem: Clinical Measurements: Goal: Respiratory complications will improve Outcome: Not Progressing

## 2019-05-26 NOTE — Evaluation (Signed)
Physical Therapy Evaluation Patient Details Name: Dennis Gomez MRN: [id number] DOB: 22-May-1951 Today's Date: 05/26/2019   History of Present Illness  Pt is a 68 y/o male who presents to PT s/p CABG x4 and aortic valve replacement on 05/21/2019. PMH significant for CAD, COPD, CKD III.  Clinical Impression  Pt admitted with above diagnosis. Pt currently with functional limitations due to the deficits listed below (see PT Problem List). At the time of PT eval pt was able to perform transfers and ambulation with gross min guard assist and RW for support. Pt required increased VC's for maintenance of sternal precautions, and did not seem to be fully understanding the necessity of those precautions. Noted pt's O2 sats decreased to 87% throughout gait training on RA, however was able to be improved back >90% with seated rest break and pursed-lip breathing. Recommend HHPT to follow up with transition to Cardiac Rehab when appropriate. Acutely, pt will benefit from skilled PT to increase their independence and safety with mobility to allow discharge to the venue listed below.       Follow Up Recommendations Home health PT;Supervision for mobility/OOB    Equipment Recommendations  Rolling walker with 5" wheels    Recommendations for Other Services       Precautions / Restrictions Precautions Precautions: Sternal;Fall Precaution Comments: Verbally reviewed sternal precautions. Pt required reinforcement of precautions during functional mobility.  Restrictions Weight Bearing Restrictions: No Other Position/Activity Restrictions: Can weight bear through UE's as long as "in the tube"      Mobility  Bed Mobility               General bed mobility comments: Pt was received sitting up in recliner chair with RN present in room.   Transfers Overall transfer level: Needs assistance Equipment used: Rolling walker (2 wheeled) Transfers: Sit to/from Stand Sit to Stand: Min guard          General transfer comment: Close guard for safety as pt powered up to full stand. Pt was cued to keep UE support narrow and to push up from fronts of legs if needed, but pt insisting on pulling up from walker.   Ambulation/Gait Ambulation/Gait assistance: Min guard Gait Distance (Feet): 200 Feet Assistive device: Rolling walker (2 wheeled) Gait Pattern/deviations: Step-through pattern;Decreased stride length;Trunk flexed Gait velocity: Decreased Gait velocity interpretation: <1.8 ft/sec, indicate of risk for recurrent falls General Gait Details: VC's for improved posture and pursed-lip breathing. Pt on RA throughout gait training and sats decreased to 87% by end of gait training, improving back to mid 90's with seated rest break. Pt took 1 short standing rest break but otherwise was able to ambulate almost non-stop in hall.   Stairs            Wheelchair Mobility    Modified Rankin (Stroke Patients Only)       Balance Overall balance assessment: Needs assistance Sitting-balance support: Feet supported;No upper extremity supported Sitting balance-Leahy Scale: Fair     Standing balance support: Bilateral upper extremity supported;During functional activity Standing balance-Leahy Scale: Poor Standing balance comment: Reliant on UE support throughout gait training.                              Pertinent Vitals/Pain Pain Assessment: No/denies pain    Home Living Family/patient expects to be discharged to:: Private residence Living Arrangements: Spouse/significant other Available Help at Discharge: Family;Available 24 hours/day Type of Home: House Home Access: Stairs  to enter Entrance Stairs-Rails: None Entrance Stairs-Number of Steps: 1 Home Layout: One level Home Equipment: None      Prior Function Level of Independence: Independent               Hand Dominance        Extremity/Trunk Assessment   Upper Extremity Assessment Upper Extremity  Assessment: Defer to OT evaluation    Lower Extremity Assessment Lower Extremity Assessment: Generalized weakness    Cervical / Trunk Assessment Cervical / Trunk Assessment: Other exceptions Cervical / Trunk Exceptions: Forward head posture with rounded shoulders  Communication   Communication: No difficulties  Cognition Arousal/Alertness: Awake/alert Behavior During Therapy: WFL for tasks assessed/performed Overall Cognitive Status: Impaired/Different from baseline Area of Impairment: Following commands;Safety/judgement;Awareness;Problem solving                       Following Commands: Follows one step commands consistently;Follows multi-step commands inconsistently Safety/Judgement: Decreased awareness of safety;Decreased awareness of deficits Awareness: Emergent Problem Solving: Slow processing General Comments: Pt with some instances of slow processing and requiring repetition of explanations at times to feel like pt was understanding and on the same page as therapist.       General Comments      Exercises     Assessment/Plan    PT Assessment Patient needs continued PT services  PT Problem List Decreased strength;Decreased activity tolerance;Decreased balance;Decreased mobility;Decreased knowledge of use of DME;Decreased safety awareness;Decreased knowledge of precautions       PT Treatment Interventions DME instruction;Gait training;Functional mobility training;Therapeutic activities;Therapeutic exercise;Neuromuscular re-education;Patient/family education    PT Goals (Current goals can be found in the Care Plan section)  Acute Rehab PT Goals Patient Stated Goal: Home today PT Goal Formulation: With patient Time For Goal Achievement: 06/02/19 Potential to Achieve Goals: Good    Frequency Min 3X/week   Barriers to discharge        Co-evaluation               AM-PAC PT "6 Clicks" Mobility  Outcome Measure Help needed turning from your back to  your side while in a flat bed without using bedrails?: None Help needed moving from lying on your back to sitting on the side of a flat bed without using bedrails?: A Little Help needed moving to and from a bed to a chair (including a wheelchair)?: A Little Help needed standing up from a chair using your arms (e.g., wheelchair or bedside chair)?: A Little Help needed to walk in hospital room?: A Little Help needed climbing 3-5 steps with a railing? : A Little 6 Click Score: 19    End of Session Equipment Utilized During Treatment: Gait belt Activity Tolerance: Patient tolerated treatment well Patient left: in chair;with call bell/phone within reach Nurse Communication: Mobility status PT Visit Diagnosis: Unsteadiness on feet (R26.81);Difficulty in walking, not elsewhere classified (R26.2)    Time: 1232-1300 PT Time Calculation (min) (ACUTE ONLY): 28 min   Charges:   PT Evaluation $PT Eval Moderate Complexity: 1 Mod PT Treatments $Gait Training: 8-22 mins        Rolinda Roan, PT, DPT Acute Rehabilitation Services Pager: [phone] Office: [phone]   Thelma Comp 05/26/2019, 2:10 PM

## 2019-05-27 MED ORDER — LOSARTAN POTASSIUM 25 MG PO TABS: 25.0000 mg | ORAL_TABLET | Freq: Every day | ORAL | 3 refills | Status: DC

## 2019-05-27 MED ORDER — TRAMADOL HCL 50 MG PO TABS: 50.0000 mg | ORAL_TABLET | ORAL | 0 refills | Status: DC | PRN

## 2019-05-27 MED ORDER — AMIODARONE HCL 200 MG PO TABS: 200.0000 mg | ORAL_TABLET | Freq: Two times a day (BID) | ORAL | 1 refills | Status: DC

## 2019-05-27 MED ORDER — DM-GUAIFENESIN ER 30-600 MG PO TB12: 1.0000 | ORAL_TABLET | Freq: Two times a day (BID) | ORAL | Status: DC

## 2019-05-27 MED ORDER — ATORVASTATIN CALCIUM 80 MG PO TABS: 80.0000 mg | ORAL_TABLET | Freq: Every day | ORAL | 3 refills | Status: DC

## 2019-05-27 MED ORDER — FE FUMARATE-B12-VIT C-FA-IFC PO CAPS: 1.0000 | ORAL_CAPSULE | Freq: Three times a day (TID) | ORAL | 0 refills | Status: DC

## 2019-05-27 MED ORDER — ASPIRIN 325 MG PO TBEC: 325.0000 mg | DELAYED_RELEASE_TABLET | Freq: Every day | ORAL | 0 refills | Status: DC

## 2019-05-27 MED ORDER — FUROSEMIDE 40 MG PO TABS: 40.0000 mg | ORAL_TABLET | Freq: Every day | ORAL | 0 refills | Status: DC

## 2019-05-27 NOTE — Progress Notes (Signed)
CARDIAC REHAB PHASE I   PRE:  Rate/Rhythm: 55 SR with PACs    BP: sitting 147/80    SaO2: 95 2L, 90 RA  MODE:  Ambulation: 470 ft   POST:  Rate/Rhythm: 92 SR    BP: sitting 142/75     SaO2: 87-90 RA  Ambulated pt to reassess for O2 needs before d/c.  SaO2 90 RA in bed. Pt used RW in hall. I had him stop numerous times to check SAO2. Occasionally pt SAO2 briefly 87 RA however immediately would increase to 90 RA without O2 intervention. Good waveform on pulse ox when pt stopped ambulating. Pt denied c/o, no SOB. I never noted lower than 87 RA. He was able to increase distance today although he was unsteady at times, even with RW. Not complaining of leg pain today. To recliner, SAO2 mostly 90 RA. Encouraged more IS use, today performing 1000-1200 mL. Also encouraged deep breathing, walking and resting at times at home. Wife present and also discussed brief ed with pts son on phone. All voiced understanding. [place], ACSM 05/27/2019 10:15 AM

## 2019-05-27 NOTE — Progress Notes (Signed)
Discharge instructions (including medications) discussed with and copy provided to patient/caregiver 

## 2019-05-27 NOTE — Progress Notes (Signed)
   CARDIOLOGY RECOMMENDATIONS:  Discharge is anticipated in the next 48 hours. LVEF has recovered perioperatively. Recommendations for medications and follow up:  1. Amiodarone 200 mg BID 2. Aspirin 325 mg daily 3. Atorvastatin 80 mg daily 4. Losartan 25 mg daily 5. Aldactone 25 mg daily 6. Lasix 40 mg daily - limited course  Discharge Medications: Continue medications as they are currently listed in the [facility]. Exceptions to the above:  None  Follow Up: The patient's Primary Cardiologist is Lauree Chandler, MD  Follow up in the office in 2 week(s).  Signed,   Pixie Casino, MD, FACC, [place] Director of the [facility]  [facility] Diplomate of the American Board of Clinical Lipidology Attending Cardiologist  Direct Dial: [phone]  Fax: [phone]  Website:  [email or web address]  Pixie Casino, MD  10:04 AM 05/27/2019

## 2019-05-27 NOTE — Plan of Care (Signed)
  Problem: Education: Goal: Knowledge of General Education information will improve Description: Including pain rating scale, medication(s)/side effects and non-pharmacologic comfort measures Outcome: Adequate for Discharge   Problem: Health Behavior/Discharge Planning: Goal: Ability to manage health-related needs will improve Outcome: Adequate for Discharge   Problem: Clinical Measurements: Goal: Ability to maintain clinical measurements within normal limits will improve Outcome: Adequate for Discharge Goal: Will remain free from infection Outcome: Adequate for Discharge Goal: Diagnostic test results will improve Outcome: Adequate for Discharge Goal: Respiratory complications will improve Outcome: Adequate for Discharge Goal: Cardiovascular complication will be avoided Outcome: Adequate for Discharge   Problem: Activity: Goal: Risk for activity intolerance will decrease Outcome: Adequate for Discharge   Problem: Nutrition: Goal: Adequate nutrition will be maintained Outcome: Adequate for Discharge   Problem: Coping: Goal: Level of anxiety will decrease Outcome: Adequate for Discharge   Problem: Elimination: Goal: Will not experience complications related to bowel motility Outcome: Adequate for Discharge Goal: Will not experience complications related to urinary retention Outcome: Adequate for Discharge   Problem: Pain Managment: Goal: General experience of comfort will improve Outcome: Adequate for Discharge   Problem: Safety: Goal: Ability to remain free from injury will improve Outcome: Adequate for Discharge   Problem: Skin Integrity: Goal: Risk for impaired skin integrity will decrease Outcome: Adequate for Discharge   Problem: Education: Goal: Will demonstrate proper wound care and an understanding of methods to prevent future damage Outcome: Adequate for Discharge Goal: Knowledge of disease or condition will improve Outcome: Adequate for Discharge Goal:  Knowledge of the prescribed therapeutic regimen will improve Outcome: Adequate for Discharge Goal: Individualized Educational Video(s) Outcome: Adequate for Discharge   Problem: Activity: Goal: Risk for activity intolerance will decrease Outcome: Adequate for Discharge   Problem: Cardiac: Goal: Will achieve and/or maintain hemodynamic stability Outcome: Adequate for Discharge   Problem: Clinical Measurements: Goal: Postoperative complications will be avoided or minimized Outcome: Adequate for Discharge   Problem: Respiratory: Goal: Respiratory status will improve Outcome: Adequate for Discharge   Problem: Skin Integrity: Goal: Wound healing without signs and symptoms of infection Outcome: Adequate for Discharge Goal: Risk for impaired skin integrity will decrease Outcome: Adequate for Discharge   Problem: Urinary Elimination: Goal: Ability to achieve and maintain adequate renal perfusion and functioning will improve Outcome: Adequate for Discharge   Problem: Acute Rehab PT Goals(only PT should resolve) Goal: Pt Will Go Supine/Side To Sit Outcome: Adequate for Discharge Goal: Patient Will Transfer Sit To/From Stand Outcome: Adequate for Discharge Goal: Pt Will Ambulate Outcome: Adequate for Discharge

## 2019-05-27 NOTE — TOC Transition Note (Signed)
Transition of Care [facility]) - CM/SW Discharge Note Marvetta Gibbons RN, BSN Transitions of Care Unit 4E- RN Case Manager [phone]   Patient Details  Name: Dennis Gomez MRN: [id number] Date of Birth: 20-Feb-1951  [facility]) CM/SW Contact:  Dennis Patricia, RN Phone Number: 05/27/2019, 11:24 AM   Clinical Narrative:    Pt stable for transition home today, order placed for [facility] and DME, per pt's wife pt already has RW for home, and per Cardiac rehab- pt walked on RA today and does not meet qualifying guidelines for home 02- CM spoke with pt and wife at bedside- pt sitting in chair on RA. Discussed HHRN needs and list provided Per CMS guidelines from medicare.gov website with star ratings (copy placed in shadow chart), per wife they do not have a preference [facility] with [facility] for [facility] needs- referral has been accepted for [facility]. Pt to return home with family.   Final next level of care: [place] Barriers to Discharge: No Barriers Identified   Patient Goals and CMS Choice Patient states their goals for this hospitalization and ongoing recovery are:: to go home CMS Medicare.gov Compare Post Acute Care list provided to:: Patient Choice offered to / list presented to : Patient  Discharge Placement  Home with [facility]                     Discharge Plan and Services   Discharge Planning Services: CM Consult Post Acute Care Choice: Durable Medical Equipment, Home Health            DME Agency: NA       HH Arranged: RN [facility] Agency: [place] ([place]) Date HH Agency Contacted: 05/27/19 Time [place]: 1122 Representative spoke with at [place]: [place] ([place]) Interventions     Readmission Risk Interventions Readmission Risk Prevention Plan 05/27/2019  Transportation Screening Complete  PCP or Specialist Appt within 5-7 Days Complete  Home Care  Screening Complete  Medication Review (RN CM) Complete

## 2019-05-27 NOTE — Progress Notes (Signed)
      [address]       [place] [address]             [phone]      6 Days Post-Op Procedure(s) (LRB): CORONARY ARTERY BYPASS GRAFTING (CABG) x Four , using left internal mammary artery and right leg greater saphenous vein harvested endoscopically (N/A) AORTIC VALVE REPLACEMENT (AVR) using Inspiris Valve size 37mm (N/A) TRANSESOPHAGEAL ECHOCARDIOGRAM (TEE) (N/A)   Subjective:  No new complaints.  Ready to go home.  He is has been unable to wean off oxygen.  Objective: Vital signs in last 24 hours: Temp:  [97.6 F (36.4 C)-98.2 F (36.8 C)] 97.8 F (36.6 C) (08/10 0418) Pulse Rate:  [64-72] 68 (08/10 0418) Cardiac Rhythm: Heart block;Bundle branch block;Normal sinus rhythm (08/09 1900) Resp:  [13-21] 20 (08/10 0418) BP: (126-154)/(79-86) 148/86 (08/10 0418) SpO2:  [94 %-98 %] 95 % (08/10 0418) Weight:  [66.1 kg] 66.1 kg (08/10 0418)  Intake/Output from previous day: 08/09 0701 - 08/10 0700 In: 340 [P.O.:340] Out: 600 [Urine:600]  General appearance: alert, cooperative and no distress Heart: regular rate and rhythm Lungs: clear to auscultation bilaterally Abdomen: soft, non-tender; bowel sounds normal; no masses,  no organomegaly Extremities: edema trace Wound: clean and dry, ecchymosis RLE  Lab Results: Recent Labs    05/26/19 0319  WBC 7.0  HGB 8.7*  HCT 26.3*  PLT 127*   BMET:  Recent Labs    05/26/19 0319  NA 141  K 3.9  CL 109  CO2 21*  GLUCOSE 86  BUN 26*  CREATININE 0.91  CALCIUM 8.2*    PT/INR: No results for input(s): LABPROT, INR in the last 72 hours. ABG    Component Value Date/Time   PHART 7.387 05/21/2019 2231   HCO3 18.6 (L) 05/21/2019 2231   TCO2 20 (L) 05/21/2019 2231   ACIDBASEDEF 6.0 (H) 05/21/2019 2231   O2SAT 51.5 05/23/2019 0415   CBG (last 3)  No results for input(s): GLUCAP in the last 72 hours.  Assessment/Plan: S/P Procedure(s) (LRB): CORONARY ARTERY BYPASS GRAFTING (CABG) x Four , using left  internal mammary artery and right leg greater saphenous vein harvested endoscopically (N/A) AORTIC VALVE REPLACEMENT (AVR) using Inspiris Valve size 63mm (N/A) TRANSESOPHAGEAL ECHOCARDIOGRAM (TEE) (N/A)  1. CV- Sinus Loletha Grayer with 1st degree AV Block-on Amiodarone, Cozaar 2. Pulm- underlying COPD, unable to wean oxygen, home use has been arranged, will continue IS 3. Renal- creatinine stable, weight is up about 14 lbs, continue Lasix, Aldactone 4. Dispo- patient stable, home oxygen arranged, will d/c home today   LOS: 10 days    Ellwood Handler 05/27/2019

## 2019-05-27 NOTE — Discharge Instructions (Signed)
Aortic Valve Regurgitation  Aortic valve regurgitation is a condition that happens when the aortic valve does not close all the way. The aortic valve is a gate-like structure between the lower left chamber of the heart (left ventricle) and the main blood vessel that supplies blood to the rest of the body (aorta). The aortic valve opens when the left ventricle squeezes to pump blood into the aorta, and it closes when the left ventricle relaxes. In aortic valve regurgitation, which may also be called aortic insufficiency, blood in the aorta leaks through the aortic valve after it has closed. This causes the heart to work harder than usual. If aortic valve regurgitation is not treated, it causes enlargement and weakening of the left ventricle. This can result in heart failure, abnormal heart rhythms (arrhythmias), and other dangerous conditions. If this condition develops suddenly, it may need to be treated with emergency surgery. What are the causes? This condition may be caused by anything that weakens the aortic valve, such as:  Severe high blood pressure (hypertension).  Infection of the inner lining of the heart or the heart valves (endocarditis).  A ballooning of a weak spot in the aorta wall (aortic aneurysm).  A tear or separation of the inner walls of the aorta (aortic dissection).  Injury (trauma) that damages the aortic valve.  Certain medicines.  Disease of a protein in the body called collagen (collagen vascular disease).  A heart problem (bicuspid aortic valve) that is present at birth (congenital).  An inflammatory condition that can develop after an untreated strep throat infection (rheumatic fever).  Complications during or after a heart surgery. This is rare. What are the signs or symptoms? Symptoms of this condition include:  Fatigue.  Shortness of breath.  Difficulty breathing while lying flat (orthopnea). You may need to sleep on two or more pillows to breathe  better.  Chest discomfort (angina).  Head bobbing.  A fluttering feeling in the chest (palpitations).  An irregular or faster-than-normal heartbeat. Symptoms usually develop gradually, unless this condition was caused by a major injury or by endocarditis. How is this diagnosed? This condition is diagnosed based on:  A physical exam.  An imaging test that uses sound waves to produce images of the heart (echocardiogram). You may also have other tests to confirm the diagnosis, including:  Chest X-ray.  MRI.  A test that records the electrical impulses of the heart (electrocardiogram, ECG).  CT angiogram (CTA). In this procedure, a large X-ray machine, called a CT scanner, takes detailed pictures of blood vessels after dye has been injected into the vessels.  Aortic angiogram. In this procedure, X-ray images are taken after dye has been injected into blood vessels. This tests the function of the aorta. How is this treated? Treatment depends on your symptoms, how severe the condition is, and what problems the condition is causing. Treatment may include:  Observation. If your condition is mild, you may not need treatment. However, you will need to have your condition checked regularly to make sure it is not getting worse or causing serious problems.  Medicines that help the heart work more efficiently.  Surgery to repair or replace the valve, in severe cases. Surgery is usually recommended if the left ventricle enlarges beyond a certain point. If aortic valve regurgitation occurs suddenly, surgery may be needed immediately. Follow these instructions at home:  Take over-the-counter and prescription medicines only as told by your health care provider.  Do not use any products that contain nicotine  or tobacco, such as cigarettes, e-cigarettes, and chewing tobacco. If you need help quitting, ask your health care provider.  If directed by your health care provider, avoid heavy weight  lifting and contact sports such as football.  Follow instructions from your health care provider about eating or drinking restrictions. Your health care provider may recommend that you: ? Limit alcohol use to:  0-1 drink a day for women.  0-2 drinks a day for men. ? Be aware of how much alcohol is in your drink. In the [place] one drink equals one 12 oz bottle of beer (355 mL), one 5 oz glass of wine (148 mL), or one 1 oz glass of hard liquor (44 mL). ? Eat foods that are high in fiber, such as fresh fruits and vegetables, whole grains, and beans. ? Eat less salt (sodium) and salty foods. Check ingredients and nutrition facts on packaged foods and beverages.  Keep all follow-up visits as told by your health care provider. This is important. You may need regular tests to monitor your condition and check how well your heart is pumping blood. Contact a health care provider if:  Your angina symptoms are more frequent or seem to be getting worse.  Your breathing problems seem to be getting worse.  You feel dizzy or close to fainting.  You have swelling in your feet, ankles, legs, or abdomen.  You urinate more than usual during the night (nocturia).  You have an unexplained fever that lasts 2 days or longer.  You develop new symptoms. Get help right away if you:  Have severe chest pain.  Have severe shortness of breath.  Feel rapid or irregular heartbeats.  Feel light-headed or you faint.  Have sudden, unexplained weight gain. Summary  Aortic valve regurgitation is a condition in which the aortic valve does not close all the way. This causes the heart to work harder than usual.  This condition may be treated with observation, medicines, or surgery.  Take over-the-counter and prescription medicines only as told by your health care provider.  Eat less salt (sodium) and salty foods. Check ingredients and nutrition facts on packaged foods and beverages.  Get help right away if  you have severe chest pain, shortness of breath, irregular heartbeats, sudden weight gain, or if you feel light-headed or you faint. This information is not intended to replace advice given to you by your health care provider. Make sure you discuss any questions you have with your health care provider. Document Released: 04/09/2003 Document Revised: 11/07/2018 Document Reviewed: 06/26/2018 Elsevier Patient Education  2020 Elsevier Inc.   Atelectasis, Adult  Atelectasis is a collapse of air sacs in the lungs (alveoli). The condition causes all or part of a lung to collapse. Atelectasis is a common problem after surgery. Its severity depends on the size of lung tissue area involved and the underlying cause. When severe, it can lead to shortness of breath and heart problems. Atelectasis can develop suddenly or over a long period of time. Atelectasis that develops over a long period of time (chronic atelectasis) often leads to infection, scarring, and other problems. What are the causes? This condition may be caused by:  Shallow breathing.  Medicines that make breathing more shallow.  A blockage in an airway. Blockages can result from: ? A buildup of mucus. ? A tumor. ? An inhaled object (foreign body). ? Enlarged lymph nodes. ? Fluid in the lungs (pleural effusion). ? A blood clot in the lungs.  Outside pressure on  the lung. Pressure can be due to: ? A tumor. ? Fluid in the lungs (pleural effusion). ? Air leaking between the lung and rib cage (pneumothorax). ? Enlarged lymph nodes.  Improper expansion of the lungs. This may occur in newborns because of: ? Prematurity. ? Low oxygen levels. ? Secretions at birth that block the airway. ? Amniotic fluid that goes into the lungs (aspiration). What increases the risk? This condition is more likely to develop in people who:  Have an injury or health problem that makes taking deep breaths difficult or painful.  Have certain infections  or diseases, such as pneumonia or cystic fibrosis.  Have had surgery on the chest or abdomen.  Have broken ribs.  Have a tight bandage around their chest.  Have a collapsed lung due to pneumothorax.  Take medicines that decrease the rate of their breathing or how deeply they breathe, like sedatives.  Lie flat for long periods of time. What are the signs or symptoms? Often, there are no symptoms for this condition. When symptoms do appear, they may include:  Shortness of breath.  Bluish color to the nails, lips, or mouth (cyanosis).  A cough. How is this diagnosed? This condition may be diagnosed based on:  Symptoms.  A physical exam.  A chest X-ray. Sometimes specialized imaging tests are needed to diagnose the condition. How is this treated? Treatment for this condition depends on what caused the condition. Treatment may involve:  Coughing. Coughing helps loosen mucus in the airway.  Chest physiotherapy. This is a treatment to help loosen and clear mucus from the airways. It is done by clapping the chest.  Postural drainage techniques. This treatment involves positioning your body so your head is lower than your chest. It helps mucus drain from your airways.  An incentive spirometer. This is a device that is used to help with taking deeper breaths.  Positive pressure breathing. This is a form of breathing assistance in which air is forced into the lungs when you breathe in (inhale). You may have this treatment if your condition is severe.  Treatment of the underlying condition. Follow these instructions at home:  Take over-the-counter and prescription medicines only as told by your health care provider.  Practice taking relaxed and deep breaths when you are sitting. A good time to practice is when you are watching TV. Take a few deep breaths during each commercial break.  Make sure to lie on your unaffected side when you are lying down. For example, if you have  atelectasis in your left lung, lie on your right side. This will help mucus drain from your airway.  Cough several times a day as told by your health care provider.  Perform chest physiotherapy or postural drainage techniques as told by your health care provider. If necessary, have someone help you.  If you were given a device to help with breathing, use it as told by your health care provider.  Stay as active as possible. Get help right away if:  Your breathing problems get worse.  You have severe chest pain.  You develop severe coughing.  You cough up blood.  You have a fever.  You have persistent symptoms for more than 2-3 days.  Your symptoms suddenly get worse. This information is not intended to replace advice given to you by your health care provider. Make sure you discuss any questions you have with your health care provider. Document Released: 10/03/2005 Document Revised: 09/15/2017 Document Reviewed: 03/07/2016 Elsevier Patient Education  2020 Elsevier Inc.   Chest Wall Pain Chest wall pain is pain in or around the bones and muscles of your chest. Chest wall pain may be caused by:  An injury.  Coughing a lot.  Using your chest and arm muscles too much. Sometimes, the cause may not be known. This pain may take a few weeks or longer to get better. Follow these instructions at home: Managing pain, stiffness, and swelling If told, put ice on the painful area:  Put ice in a plastic bag.  Place a towel between your skin and the bag.  Leave the ice on for 20 minutes, 2-3 times a day.  Activity  Rest as told by your doctor.  Avoid doing things that cause pain. This includes lifting heavy items.  Ask your doctor what activities are safe for you. General instructions   Take over-the-counter and prescription medicines only as told by your doctor.  Do not use any products that contain nicotine or tobacco, such as cigarettes, e-cigarettes, and chewing  tobacco. If you need help quitting, ask your doctor.  Keep all follow-up visits as told by your doctor. This is important. Contact a doctor if:  You have a fever.  Your chest pain gets worse.  You have new symptoms. Get help right away if:  You feel sick to your stomach (nauseous) or you throw up (vomit).  You feel sweaty or light-headed.  You have a cough with mucus from your lungs (sputum) or you cough up blood.  You are short of breath. These symptoms may be an emergency. Do not wait to see if the symptoms will go away. Get medical help right away. Call your local emergency services (911 in the [place] Do not drive yourself to the hospital. Summary  Chest wall pain is pain in or around the bones and muscles of your chest.  It may be treated with ice, rest, and medicines. Your condition may also get better if you avoid doing things that cause pain.  Contact a doctor if you have a fever, chest pain that gets worse, or new symptoms.  Get help right away if you feel light-headed or you get short of breath. These symptoms may be an emergency. This information is not intended to replace advice given to you by your health care provider. Make sure you discuss any questions you have with your health care provider. Document Released: 03/21/2008 Document Revised: 04/05/2018 Document Reviewed: 04/05/2018 Elsevier Patient Education  2020 [employer]   Cardiac Rehabilitation What is cardiac rehabilitation? Cardiac rehabilitation is a treatment program that helps improve the health and well-being of people who have heart problems. Cardiac rehabilitation includes exercise training, education, and counseling to help you get stronger and return to an active lifestyle. This program can help you get better faster and reduce any future hospital stays. Why might I need cardiac rehabilitation? Cardiac rehabilitation programs can help when you have or have had:  A heart attack.  Heart  failure.  Peripheral artery disease.  Coronary artery disease.  Angina.  Lung or breathing problems. Cardiac rehabilitation programs are also used when you have had:  Coronary artery bypass graft surgery.  Heart valve replacement.  Heart stent placement.  Heart transplant.  Aneurysm repair. What are the benefits of cardiac rehabilitation? Cardiac rehabilitation can help you:  Reduce problems like chest pain and trouble breathing.  Change risk factors that contribute to heart disease, such as: ? Smoking. ? High blood pressure. ? High cholesterol. ? Diabetes. ?  Being inactive. ? Weighing over 30% more than your ideal weight. ? Diet.  Improve your emotional outlook so you feel: ? More hopeful. ? Better about yourself. ? More confident about taking care of yourself.  Get support from health experts as well as other people with similar problems.  Learn healthy ways to manage stress.  Learn how to manage and understand your medicines.  Teach your family about your condition and how to participate in your recovery. What happens in cardiac rehabilitation? You will be assessed by a cardiac rehabilitation team. They will check your health history and do a physical exam. You may need blood tests, exercise stress tests, and other evaluations to make sure that you are ready to start cardiac rehabilitation. The cardiac rehabilitation team works with you to make a plan based on your health and goals. Your program will be tailored to fit you and your needs and may change as you progress. You may work with a health care team that includes:  Doctors.  Nurses.  Dietitians.  Psychologists.  Exercise specialists.  Physical and occupational therapists. What are the phases of cardiac rehabilitation? A cardiac rehabilitation program is often divided into phases. You advance from one phase to the next. Phase 1 This phase starts while you are still in the hospital. You  may:  Start by walking in your room and then in the hall.  Do some simple exercises with a therapist.  Phase 2 This phase begins when you go home or to another facility. You will travel to a cardiac rehabilitation center or another place where rehabilitation is offered. This phase may last 8-12 weeks. During this phase:  You will slowly increase your activity level while being closely watched by a nurse or therapist.  You will have medical tests and exams to monitor your progress.  Your exercises may include strength or resistance training along with activities that cause your heart to beat faster (aerobic exercises), such as walking on a treadmill.  Your condition will determine how often and how long these sessions last.  You may learn how to: ? Adriana SimasCook heart-healthy meals. ? Control your blood sugar, if this applies. ? Stop smoking. ? Manage your medicines. You may need help with scheduling or planning how and when to take your medicines. If you have questions about your medicines, it is very important that you talk with your health care provider.  Phase 3 This phase continues for the rest of your life. In this phase:  There will be less supervision.  You may continue to participate in cardiac rehabilitation activities or become part of a group in your community.  You may benefit from talking about your experience with other people who are facing similar challenges. Follow these instructions at home:  Take over-the-counter and prescription medicines only as told by your health care provider.  Keep all follow-up visits as told by your health care provider. This is important. Get help right away if:  You have severe chest discomfort, especially if the pain is crushing or pressure-like and spreads to your arms, back, neck, or jaw. Do not wait to see if the pain will go away.  You have weakness or numbness in your face, arms, or legs, especially on one side of the body.  Your  speech is slurred.  You are confused.  You have a sudden, severe headache or loss of vision.  You have shortness of breath.  You are sweating and have nausea.  You feel dizzy or  faint.  You are fatigued. These symptoms may represent a serious problem that is an emergency. Do not wait to see if the symptoms will go away. Get medical help right away. Call your local emergency services (911 in the [place] Do not drive yourself to the hospital. Summary  Cardiac rehabilitation is a treatment program that helps improve the health and well-being of people who have heart problems.  A cardiac rehabilitation program is often divided into phases. You advance from one phase to the next.  The cardiac rehabilitation team works with you to make a plan based on your health and goals.  Cardiac rehabilitation includes exercise training, education, and counseling to help you get stronger and return to an active lifestyle. This information is not intended to replace advice given to you by your health care provider. Make sure you discuss any questions you have with your health care provider. Document Released: 07/12/2008 Document Revised: 01/23/2019 Document Reviewed: 08/02/2018 Elsevier Patient Education  2020 [employer]. Discharge Instructions:  1. You may shower, please wash incisions daily with soap and water and keep dry.  If you wish to cover wounds with dressing you may do so but please keep clean and change daily.  No tub baths or swimming until incisions have completely healed.  If your incisions become red or develop any drainage please call our office at [phone]  2. No Driving until cleared by Dr. Orvan July  office and you are no longer using narcotic pain medications  3. Monitor your weight daily.. Please use the same scale and weigh at same time... If you gain 5-10 lbs in 48 hours with associated lower extremity swelling, please contact our office at [phone]  4. Fever of 101.5 for  at least 24 hours with no source, please contact our office at [phone]  5. Activity- up as tolerated, please walk at least 3 times per day.  Avoid strenuous activity, no lifting, pushing, or pulling with your arms over 8-10 lbs for a minimum of 6 weeks  6. If any questions or concerns arise, please do not hesitate to contact our office at [phone]

## 2019-05-28 NOTE — Telephone Encounter (Signed)
**Note De-Identified  Obfuscation** Patient contacted regarding discharge from [facility] on 05/27/2019. The pt gave permission over the phone for me to s/w his wife Dennis Gomez concerning his care.  Patient and his wife understands to follow up with provider Melina Copa, PA-c on 06/05/2019 at 9:15 at [facility] in [place]. Patient and his wife understands discharge instructions? Yes Patient and his wife understands medications and regiment? Yes Patient and his wife understands to bring all medications to this visit? Yes   Postop Surgical Patients:                What is your wound status? Per Dennis Gomez the pts wound looks good  Any signs/ symptoms of infection (Temp, redness/ red streaks, swelling, purulent drainage, foul odor or smell)? No .             Please do not place any creams/ lotions/ or antibiotic ointment on any surgical incisions/ wounds without physician approval. Dennis Gomez is aware. .             Do you have any questions about your medications?  No All medications (except pain medications) are to be filled by your Cardiologist AFTER your first post op appointment with them.  Are you taking your pain medication? No .             How is your pain controlled? Very well             Pain level?  On a scale of 1 to 10 with 1 being the least Dennis Gomez states that his pain level is 1 as he has not complained of pain nor took any pain meds since being home. .             If you require a refill on pain medications, know that the same medication/ amount may not be prescribed or a refill may not be given.  Please contact your pharmacy for refill requests. Dennis Gomez is aware .             Do you have help at home with ADL's? Yes  If you have home health, have you been contacted or seen by the agency? Yes .             Please refer to your Pre/post surgery booklet, there is a lot of useful information in it that may answer any questions you may have. Dennis Gomez is aware .             Please note that it is ok to remove your  surgical dressing, shower (soap/ water), and pat the incision dry. Dennis Gomez is aware

## 2019-05-29 DIAGNOSIS — Z48812 Encounter for surgical aftercare following surgery on the circulatory system: Secondary | ICD-10-CM | POA: Diagnosis not present

## 2019-05-30 ENCOUNTER — Telehealth: Payer: Self-pay | Admitting: Physician Assistant

## 2019-05-30 NOTE — Telephone Encounter (Signed)
New Message     Pts feet are swollen and pts wife and is wondering if she can elevate his feet or safe for her too because they took a vein out of his leg    Please call

## 2019-05-30 NOTE — Telephone Encounter (Signed)
Pts wife called to ask if the pt can elevate his feet when sitting.. I advised her that would be very helpful.. she says just his feet are getting puffy because he lets them hang down and he has a habit of crossing his legs.. I advised her that everytime he sits to put them up. To watch his sodium intake and to call us if the swelling starts to worsen and he cannot wear his normal shoes.. she denies that he is SOB and says he has been doing well otherwise.. he will keep his appt with [place] next week.

## 2019-05-31 ENCOUNTER — Telehealth: Payer: Self-pay | Admitting: Physician Assistant

## 2019-05-31 NOTE — Telephone Encounter (Signed)
New message:   Dennis Gomez from [facility] calling concering patient BP was 82/60

## 2019-05-31 NOTE — Telephone Encounter (Addendum)
Spoke with Levada Dy with [facility] and she called to report the pts BP 82/60... I asked her for some other reasons but she said she just start seeing him and was not sure of any others. She says he seemed to be feeling well but very tired. She says he has not been drinking much fluids as he should be. She called Dr. Ricard Dillon office and was told he was in surgery and to call our office.   I called the pts wife and she reports that she has been pushing him to drink more today and he seems to be responding well. She says he is "stubborn"... his foot edema from her call yesterday has much improved.. she says he is sleeping well this afternoon... she will have her son bring his BP cuff over and will monitor his BP over the weekend... he has been taking the Tramadol for pain... I advised her to have him be careful with ambulation after taking it if it is lowering his BP.  She will call the MD on call over the weekend here or at the surgeons office if he has any problems and she has concerns about his BP. Will forward to Dr. Angelena Form for review. She will keep his appt next Wed 06/05/19 with Melina Copa PA.

## 2019-06-03 ENCOUNTER — Encounter: Payer: Self-pay | Admitting: Physician Assistant

## 2019-06-03 NOTE — Progress Notes (Addendum)
Cardiology Office Note    Date:  06/05/2019   ID:  Dennis ChimeMelvin Gomez, DOB 03/24/1951, MRN [id number]  PCP:  Buckner MaltaBurgart, Jennifer, MD  Cardiologist:  Verne Carrowhristopher McAlhany, MD  Electrophysiologist:  None   Chief Complaint: f/u surgery for bypass and valve repair - 2 episodes of syncope  History of Present Illness:   Dennis Gomez is a 68 y.o. male with history of COPD, tobacco abuse, hypothroidism and recently diagnosed HTN, acute CHF/LV dysfunction (EF 35-40%), moderate-severe aortic regurgitation s/p pericardial AVR, CAD s/p CABG who presents for post-hospital f/u.  He was recently [facility] to [facility] with chest pressure, leg swelling, and diaphoresis. His troponin was mildly elevated. His echo at [facility] was reported to have shown a severely dilated left ventricle with moderate concentric LVH, EF 35 to 40%, moderate-severe aortic insufficiency. His echocardiogram on 05/17/19 at [facility] was read as 45-50%, mild LVH, diffuse HK, normal RV, mild LAE/RAE, moderate-severe AI, mild dilation of aortic root and ascending aorta. He underwent cardiac cath showing severe multivessel disease with moderately severe AI and normal right heart hemodynamics with low right-sided filling pressures. Coronary CT showed bicuspid aortic valve with mild ascending aortic root enlargement. He underwent AVR (bovine pericardial tissue valve) and CABGx4 on 05/21/19. Post-op TEE showed EF 60-65%. He did have episodes of post-op AF treated with amiodarone, which he was discharged on. He was not felt to require anticoagulation. Post-op course also notable for anemia/thrombocytopenia and low co-ox requiring milrinone. Last labs 05/2019 showed Hgb 8.7, plt 127, K 4.3, Cr 1.20, normal LFTs, no lipid profile on file. IM note indicates [facility] at [facility] was [facility] but unclear if his dose of levothyroxine was increased during admission. Pre-op R/L ABIs indicated mild lower extremity arterial disease and carotid  duplex showed 1-39% stenosis.  He has had a tumultuous post-op course c/b hypotension. He was seen at [facility] 06/01/19 with episode of syncope. The first time he got up around 3am and passed out and fell into a cabinet. He went to [facility] and got 66 stitches. Wife states they were dissolvable. When he got home after being discharged from the ED, he was getting out of the truck and his eyes rolled back in his head and lips turned blue and he passed out. EMS was called at pt was transported back to [facility]. His labs were nonacute, Hgb in the 9, Cr 0.9. CT of the head did not show any intracranial bleeding. His pulse ox was reported to be low. EKG reported to show NSR. Wife reports EMS said BP was 100/38. His BP had been intermittently soft so losartan and spironolactone were discontinued. There is also question that he could have had adverse reaction to hydrocodone as well, maybe contributing to low O2 sat.  He returns for follow-up today overall without complaints. He does not really contribute much to the conversation - wife assists. He denies any recurrent syncope, dizziness, lightheadedness since Saturday. He did not have any warning symptoms. No CP, SOB, palpitations, edema, or orthopnea. Home health has been involved in his care with regular home visits. Family helps with pill box.   Past Medical History:  Diagnosis Date   Aortic regurgitation    Chronic combined systolic and diastolic CHF (congestive heart failure) (HCC)    a. L[facility] 35-40% [facility], 45-50% [facility] pre-AVR, then 60-65% by postop TEE.   COPD (chronic obstructive pulmonary disease) (HCC)    Coronary artery disease    a.  s/p CABGx4 in 05/2019.   Essential hypertension    Hypothyroid    PAD (peripheral artery disease) (HCC)    a. R/L ABIs indicated mild lower extremity arterial disease and carotid duplex showed 1-39% stenosis.   Postoperative atrial fibrillation (HCC)    S/P aortic valve replacement  with bioprosthetic valve 05/21/2019   25 mm Edwards Inspiris Resilia stented bovine pericardial tissue valve   S/P CABG x 4 05/21/2019   LIMA to LAD, SVG to D1, SVG to OM, SVG to LPDA with EVH via right thigh and leg   Tobacco abuse     Past Surgical History:  Procedure Laterality Date   AORTIC VALVE REPLACEMENT N/A 05/21/2019   Procedure: AORTIC VALVE REPLACEMENT (AVR) using Inspiris Valve size 57mm;  Surgeon: Purcell Nails, MD;  Location: [facility] OR;  Service: Open Heart Surgery;  Laterality: N/A;   CORONARY ARTERY BYPASS GRAFT N/A 05/21/2019   Procedure: CORONARY ARTERY BYPASS GRAFTING (CABG) x Four , using left internal mammary artery and right leg greater saphenous vein harvested endoscopically;  Surgeon: Purcell Nails, MD;  Location: [facility] OR;  Service: Open Heart Surgery;  Laterality: N/A;   RIGHT/LEFT HEART CATH AND CORONARY ANGIOGRAPHY N/A 05/17/2019   Procedure: RIGHT/LEFT HEART CATH AND CORONARY ANGIOGRAPHY;  Surgeon: Tonny Bollman, MD;  Location: [facility] INVASIVE CV LAB;  Service: Cardiovascular;  Laterality: N/A;   TEE WITHOUT CARDIOVERSION N/A 05/21/2019   Procedure: TRANSESOPHAGEAL ECHOCARDIOGRAM (TEE);  Surgeon: Purcell Nails, MD;  Location: [facility] OR;  Service: Open Heart Surgery;  Laterality: N/A;    Current Medications: Current Meds  Medication Sig   amiodarone (PACERONE) 200 MG tablet Take 1 tablet (200 mg total) by mouth 2 (two) times daily after a meal. X 7 days, then decrease to 200 mg daily   aspirin EC 325 MG EC tablet Take 1 tablet (325 mg total) by mouth daily.   atorvastatin (LIPITOR) 80 MG tablet Take 1 tablet (80 mg total) by mouth daily at 6 PM.   dextromethorphan-guaiFENesin (MUCINEX DM) 30-600 MG 12hr tablet Take 1 tablet by mouth 2 (two) times daily.   ferrous fumarate-b12-vitamic C-folic acid (TRINSICON / FOLTRIN) capsule Take 1 capsule by mouth 3 (three) times daily after meals.   furosemide (LASIX) 40 MG tablet Take 1 tablet (40 mg total) by mouth daily.    levothyroxine (SYNTHROID) 100 MCG tablet Take 100 mcg by mouth daily.   traMADol (ULTRAM) 50 MG tablet Take 1-2 tablets (50-100 mg total) by mouth every 4 (four) hours as needed for moderate pain.   TRELEGY ELLIPTA 100-62.5-25 MCG/INH AEPB Take 1 puff by mouth daily.     Allergies:   Hydrocodone   Social History   Socioeconomic History   Marital status: Married    Spouse name: Not on file   Number of children: Not on file   Years of education: Not on file   Highest education level: Not on file  Occupational History   Not on file  Social Needs   Financial resource strain: Not on file   Food insecurity    Worry: Not on file    Inability: Not on file   Transportation needs    Medical: Not on file    Non-medical: Not on file  Tobacco Use   Smoking status: Current Every Day Smoker    Types: Cigarettes   Smokeless tobacco: Never Used  Substance and Sexual Activity   Alcohol use: Not Currently   Drug use: Not on file   Sexual activity:  Not on file  Lifestyle   Physical activity    Days per week: Not on file    Minutes per session: Not on file   Stress: Not on file  Relationships   Social connections    Talks on phone: Not on file    Gets together: Not on file    Attends religious service: Not on file    Active member of club or organization: Not on file    Attends meetings of clubs or organizations: Not on file    Relationship status: Not on file  Other Topics Concern   Not on file  Social History Narrative   Not on file     Family History:  The patient's family history includes COPD in his father; Cancer in his mother.  ROS:   Please see the history of present illness.  All other systems are reviewed and otherwise negative.    EKGs/Labs/Other Studies Reviewed:    Studies reviewed were summarized above.   EKG:  EKG is ordered today, personally reviewed, demonstrating NSR 1st degree AVB, left axis deviation, NSIVCD, cannot r/o prior inferior  infarct, nonspecific STT changes. Stable.  Recent Labs: 05/18/2019: ALT 11 05/22/2019: Magnesium 3.1 05/26/2019: BUN 26; Creatinine, Ser 0.91; Hemoglobin 8.7; Platelets 127; Potassium 3.9; Sodium 141  Recent Lipid Panel No results found for: CHOL, TRIG, HDL, CHOLHDL, VLDL, LDLCALC, LDLDIRECT  PHYSICAL EXAM:    VS:  BP 128/74    Pulse 65    Ht 5\' 5"  (1.651 m)    Wt 130 lb 12.8 oz (59.3 kg)    BMI 21.77 kg/m   BMI: Body mass index is 21.77 kg/m.  GEN: Frail elderly WM in no acute distress HEENT: normocephalic, various sutures across forehead and L eye with mild left eye swelling Neck: no JVD, carotid bruits, or masses Cardiac: RRR; no murmurs, rubs, or gallops, no edema  Respiratory:  clear to auscultation bilaterally, normal work of breathing GI: soft, nontender, nondistended, + BS MS: no deformity or atrophy Skin: warm and dry, no rash. Sternal scar well healing without dehiscence, suppuration or bleeding despite fall. Endovascular sites appear stable as well Neuro:  Alert and Oriented x 3, Strength and sensation are intact, follows commands Psych: euthymic mood, stoic affect  Wt Readings from Last 3 Encounters:  06/05/19 130 lb 12.8 oz (59.3 kg)  05/27/19 145 lb 11.6 oz (66.1 kg)     ASSESSMENT & PLAN:   1. Syncope - occurred in the context of soft blood pressure and narcotics, likely contributing. His O2 sat is tending to be low normal. This may be due in part to his COPD, but needs CT angio to rule out PE given 2 episodes of syncope and post-op status. Will arrange stat today. If this is unrevealing, plan 2D echocardiogram and 30 day monitor for completeness. I am hopeful this was simply related to BP/narcotic medication and will not recur, but need to be cautious in the setting of recurrent event. He is advised not to drive for now. Will need to be reassessed at subsequent follow-up. Recheck thyroid as below (recent TSH 79). F/u BMET/CBC today. 2. Chronic combined CHF/cardiomyopathy -  clinically appears euvolemic. Stay off losartan/spironolactone for now. He has completed course of Lasix. 3. Aortic regurgitation s/p pericardial AVR - as above, will reassess by echo but on auscultation there does not sound like any acute valvular dysfunction. Reviewed SBE ppx (has false teeth). 4. CAD s/p CABG - no recurrent anginal symptoms. Continue full dose ASA,  await input on surgery when this can be reduced to 81mg  daily. No BB given sinus bradycardia. Continue statin. If tolerating statin at f/u visit, anticipate getting lipids at that time. (Will not order yet in case other labs are needing at that time.) 5. Postop atrial fibrillation - in NSR today. Will assess via monitor. Anticipate discontinuation of amiodarone after 3 months if maintaining NSR. He was not felt to require anticoagulation in the hospital. I reminded pt/wife that at this point he should be on amiodarone once daily per instructions if he hasn't already made this change at home. 6. PAD - follow clinically, anticipate yearly fu of dilated aortic root and carotid disease - will defer timing of f/u testing to primary cardiologist. 7. Abnormal TSH - will recheck TSH today with free T4.   Disposition: F/u with Dr. McAlhany/care team after monitor, sooner if needed.  Medication Adjustments/Labs and Tests Ordered: Current medicines are reviewed at length with the patient today.  Concerns regarding medicines are outlined above. Medication changes, Labs and Tests ordered today are summarized above and listed in the Patient Instructions accessible in Encounters.   Signed, Laurann Montanaayna N Corley Kohls, PA-C  06/05/2019 10:19 AM    [facility] [address], [place], [place]  [address] Phone: [phone]; Fax: [phone]

## 2019-06-03 NOTE — Telephone Encounter (Signed)
Pt's wife notified. She reports inhaler prescribed by Dr. Roxy Manns is too expensive.  I asked her to contact [facility] regarding possible alternative.

## 2019-06-03 NOTE — Telephone Encounter (Signed)
I would have him stop the aldactone and Cozaar given soft BP and dizziness. Keep appt this week. Thanks, chris

## 2019-06-03 NOTE — Telephone Encounter (Signed)
I spoke with pt's wife.  She reports pt got up around 3 AM on Saturday and passed out.  He fell into a cabinet.  Went to [facility] and received 66 stitches to face.  Has knot on back of his head and also hit shoulder.  Did not hit chest. He was discharged early that morning and when he was getting out of truck upon arrival home he became sweaty, eyes rolled back and lips turned blue.  EMS was called and pt was transported back to [facility]. Was discharged about 2 PM that day.  Wife reports EMS said BP was 100/38.  He was given hydrocodone during first ED visit and wife thinks he may be allergic to it.  Will add to allergy list.  Since then he has been doing OK.  BP this AM was 94/66, pulse 65.  This was about 30 -60 minutes after taking AM medications.  Previous readings were - 8/14-88/63,68 8/15-100/38 8/16-90/63.  Pt is seeing Melina Copa, PA on August 19. He will keep this appointment and I told pt's wife we would call her back if Dr. Angelena Form wanted to make any changes prior to this appointment.  Wife also requests we do not speak to pt's son ([name]).  Pt will fill out DPR when here for visit on August 19.

## 2019-06-03 NOTE — Telephone Encounter (Signed)
Pat, Can you follow up with him today? He is s/p surgical aortic valve replacement and bypass. He will need to have close follow up in the CT surgery office if he is having any issues. Gerald Stabs

## 2019-06-04 ENCOUNTER — Other Ambulatory Visit: Payer: Self-pay | Admitting: *Deleted

## 2019-06-05 ENCOUNTER — Telehealth: Payer: Self-pay | Admitting: Radiology

## 2019-06-05 ENCOUNTER — Ambulatory Visit (INDEPENDENT_AMBULATORY_CARE_PROVIDER_SITE_OTHER)
Admission: RE | Admit: 2019-06-05 | Discharge: 2019-06-05 | Disposition: A | Discharge status: Home / Self Care | Payer: Medicare Other | Source: Ambulatory Visit | Attending: Physician Assistant | Admitting: Physician Assistant

## 2019-06-05 ENCOUNTER — Telehealth: Payer: Self-pay | Admitting: *Deleted

## 2019-06-05 ENCOUNTER — Other Ambulatory Visit: Payer: Self-pay

## 2019-06-05 ENCOUNTER — Encounter: Payer: Self-pay | Admitting: Physician Assistant

## 2019-06-05 ENCOUNTER — Ambulatory Visit (INDEPENDENT_AMBULATORY_CARE_PROVIDER_SITE_OTHER): Payer: Medicare Other | Admitting: Physician Assistant

## 2019-06-05 VITALS — BP 128/74 | HR 65 | Ht 65.0 in | Wt 130.8 lb

## 2019-06-05 DIAGNOSIS — Z952 Presence of prosthetic heart valve: Secondary | ICD-10-CM

## 2019-06-05 DIAGNOSIS — R7989 Other specified abnormal findings of blood chemistry: Secondary | ICD-10-CM

## 2019-06-05 DIAGNOSIS — I2510 Atherosclerotic heart disease of native coronary artery without angina pectoris: Secondary | ICD-10-CM

## 2019-06-05 DIAGNOSIS — R55 Syncope and collapse: Secondary | ICD-10-CM

## 2019-06-05 DIAGNOSIS — I739 Peripheral vascular disease, unspecified: Secondary | ICD-10-CM

## 2019-06-05 DIAGNOSIS — I4891 Unspecified atrial fibrillation: Secondary | ICD-10-CM

## 2019-06-05 DIAGNOSIS — I5042 Chronic combined systolic (congestive) and diastolic (congestive) heart failure: Secondary | ICD-10-CM | POA: Diagnosis not present

## 2019-06-05 DIAGNOSIS — I9789 Other postprocedural complications and disorders of the circulatory system, not elsewhere classified: Secondary | ICD-10-CM

## 2019-06-05 MED ORDER — IOHEXOL 350 MG/ML SOLN
80.0000 mL | Freq: Once | INTRAVENOUS | Status: AC | PRN
  Administered 2019-06-05: 11:00:00 80 mL via INTRAVENOUS

## 2019-06-05 NOTE — Telephone Encounter (Signed)
Enrolled patient for a 30 Day [employer] Event monitor to be mailed. Brief instructions were gone over with wife (per DPR) and she knows to expect the monitor to arrive in 3-4 days.

## 2019-06-05 NOTE — Telephone Encounter (Signed)
Wife, Stanton Kidney returned my call.  She has been made aware of pt's CT results.

## 2019-06-05 NOTE — Telephone Encounter (Signed)
Call placed to pt to discuss his CT from today, left a message for pt to call back.

## 2019-06-05 NOTE — Telephone Encounter (Signed)
Call placed to pt re: follow-up appointment, once he receives the heart monitor. Per Abram Sander, pt should receive it 3-4 days, not counting weekends, so I schedule f/u for 07/19/2019. If pt or spouse calls, please notify them of appt.

## 2019-06-05 NOTE — Telephone Encounter (Signed)
-----   Message from Charlie Pitter, [place] sent at 06/05/2019  1:12 PM EDT ----- Please let patient know there was no blood clot in the lung, good news. The right side of the heart appears somewhat weak. Nothing to do acutely expect continue plan as discussed, will assess on echocardiogram. Dennis Gomez

## 2019-06-05 NOTE — Patient Instructions (Addendum)
Medication Instructions:  Your physician has recommended you make the following change in your medication:  Stay off spironolactone, losartan, and furosemide for now  Your amiodarone was intended to be twice a day for a week after discharge then just one tablet once a day - please double check what you are taking to make sure you made this switch  If you need a refill on your cardiac medications before your next appointment, please call your pharmacy.   Lab work: TODAY:  BMET, MAG, CBC, TSH, & FREE T4  If you have labs (blood work) drawn today and your tests are completely normal, you will receive your results only by: [name] MyChart Message (if you have MyChart) OR . A paper copy in the mail If you have any lab test that is abnormal or we need to change your treatment, we will call you to review the results.  Testing/Procedures: Your physician has requested that you have an echocardiogram. Echocardiography is a painless test that uses sound waves to create images of your heart. It provides your doctor with information about the size and shape of your heart and how well your heart's chambers and valves are working. This procedure takes approximately one hour. There are no restrictions for this procedure.   Your physician has recommended that you wear an event monitor. Event monitors are medical devices that record the heart's electrical activity. Doctors most often [place] these monitors to diagnose arrhythmias. Arrhythmias are problems with the speed or rhythm of the heartbeat. The monitor is a small, portable device. You can wear one while you do your normal daily activities. This is usually used to diagnose what is causing palpitations/syncope (passing out).   Non-Cardiac CT Angiography (CTA) TODAY, is a special type of CT scan that uses a computer to produce multi-dimensional views of major blood vessels throughout the body. In CT angiography, a contrast material is injected through an IV to help  visualize the blood vessels  Follow-Up: At [facility], you and your health needs are our priority.  As part of our continuing mission to provide you with exceptional heart care, we have created designated Provider Care Teams.  These Care Teams include your primary Cardiologist (physician) and Advanced Practice Providers (APPs -  Physician Assistants and Nurse Practitioners) who all work together to provide you with the care you need, when you need it. You will need a follow up appointment in 4-6 weeks after monitor is put on.  Please call our office 2 months in advance to schedule this appointment.  You may see Lauree Chandler, MD or one of the following Advanced Practice Providers on your designated Care Team:   Jewett, PA-C Melina Copa, PA-C . Ermalinda Barrios, PA-C  Any Other Special Instructions Will Be Listed Below (If Applicable).  **NO DRIVING FOR  NOW**    Endocarditis Information  You may be at risk for developing endocarditis since you have  an artificial heart valve  or a repaired heart valve. Endocarditis is an infection of the lining of the heart or heart valves. Certain surgical and dental procedures may put you at risk,  such as teeth cleaning or other dental procedures or any surgery involving the respiratory, urinary, gastrointestinal tract, gallbladder or prostate. Notify your doctor or dentist before having any invasive procedures. You will need to take antibiotics before certain procedures. To prevent endocarditis, maintain good oral health. Seek prompt medical attention for any mouth/gum, skin or urinary tract infections.  Echocardiogram An echocardiogram is a  procedure that uses painless sound waves (ultrasound) to produce an image of the heart. Images from an echocardiogram can provide important information about:  Signs of coronary artery disease (CAD).  Aneurysm detection. An aneurysm is a weak or damaged part of an artery wall that bulges out from the  normal force of blood pumping through the body.  Heart size and shape. Changes in the size or shape of the heart can be associated with certain conditions, including heart failure, aneurysm, and CAD.  Heart muscle function.  Heart valve function.  Signs of a past heart attack.  Fluid buildup around the heart.  Thickening of the heart muscle.  A tumor or infectious growth around the heart valves. Tell a health care provider about:  Any allergies you have.  All medicines you are taking, including vitamins, herbs, eye drops, creams, and over-the-counter medicines.  Any blood disorders you have.  Any surgeries you have had.  Any medical conditions you have.  Whether you are pregnant or may be pregnant. What are the risks? Generally, this is a safe procedure. However, problems may occur, including:  Allergic reaction to dye (contrast) that may be used during the procedure. What happens before the procedure? No specific preparation is needed. You may eat and drink normally. What happens during the procedure?   An IV tube may be inserted into one of your veins.  You may receive contrast through this tube. A contrast is an injection that improves the quality of the pictures from your heart.  A gel will be applied to your chest.  A wand-like tool (transducer) will be moved over your chest. The gel will help to transmit the sound waves from the transducer.  The sound waves will harmlessly bounce off of your heart to allow the heart images to be captured in real-time motion. The images will be recorded on a computer. The procedure may vary among health care providers and hospitals. What happens after the procedure?  You may return to your normal, everyday life, including diet, activities, and medicines, unless your health care provider tells you not to do that. Summary  An echocardiogram is a procedure that uses painless sound waves (ultrasound) to produce an image of the  heart.  Images from an echocardiogram can provide important information about the size and shape of your heart, heart muscle function, heart valve function, and fluid buildup around your heart.  You do not need to do anything to prepare before this procedure. You may eat and drink normally.  After the echocardiogram is completed, you may return to your normal, everyday life, unless your health care provider tells you not to do that. This information is not intended to replace advice given to you by your health care provider. Make sure you discuss any questions you have with your health care provider. Document Released: 09/30/2000 Document Revised: 01/24/2019 Document Reviewed: 11/05/2016 Elsevier Patient Education  2020 Elsevier Inc.   Ambulatory Cardiac Monitoring An ambulatory cardiac monitor is a small recording device that is used to detect abnormal heart rhythms (arrhythmias). Most monitors are connected by wires to flat, sticky disks (electrodes) that are then attached to your chest. You may need to wear a monitor if you have had symptoms such as:  Fast heartbeats (palpitations).  Dizziness.  Fainting or light-headedness.  Unexplained weakness.  Shortness of breath. There are several types of monitors. Some common monitors include:  Holter monitor. This records your heart rhythm continuously, usually for 24-48 hours.  Event (episodic) monitor.  This monitor has a symptoms button, and when pushed, it will begin recording. You need to activate this monitor to record when you have a heart-related symptom.  Automatic detection monitor. This monitor will begin recording when it detects an abnormal heartbeat. What are the risks? Generally, these devices are safe to use. However, it is possible that the skin under the electrodes will become irritated. How to prepare for monitoring Your health care provider will prepare your chest for the electrode placement and show you how to use  the monitor.  Do not apply lotions to your chest before monitoring.  Follow directions on how to care for the monitor, and how to return the monitor when the testing period is complete. How to use your cardiac monitor  Follow directions about how long to wear the monitor, and if you can take the monitor off in order to shower or bathe. ? Do not let the monitor get wet. ? Do not bathe, swim, or use a hot tub while wearing the monitor.  Keep your skin clean. Do not put body lotion or moisturizer on your chest.  Change the electrodes as told by your health care provider, or any time they stop sticking to your skin. You may need to use medical tape to keep them on.  Try to put the electrodes in slightly different places on your chest to help prevent skin irritation. Follow directions from your health care provider about where to place the electrodes.  Make sure the monitor is safely clipped to your clothing or in a location close to your body as recommended by your health care provider.  If your monitor has a symptoms button, press the button to mark an event as soon as you feel a heart-related symptom, such as: ? Dizziness. ? Weakness. ? Light-headedness. ? Palpitations. ? Thumping or pounding in your chest. ? Shortness of breath. ? Unexplained weakness.  Keep a diary of your activities, such as walking, doing chores, and taking medicine. It is very important to note what you were doing when you pushed the button to record your symptoms. This will help your health care provider determine what might be contributing to your symptoms.  Send the recorded information as recommended by your health care provider. It may take some time for your health care provider to process the results.  Change the batteries as told by your health care provider.  Keep electronic devices away from your monitor. These include: ? Tablets. ? MP3 players. ? Cell phones.  While wearing your monitor you should  avoid: ? Electric blankets. ? Firefighter. ? Electric toothbrushes. ? Microwave ovens. ? Magnets. ? Metal detectors. Get help right away if:  You have chest pain.  You have shortness of breath or extreme difficulty breathing.  You develop a very fast heartbeat that does not get better.  You develop dizziness that does not go away.  You faint or constantly feel like you are about to faint. Summary  An ambulatory cardiac monitor is a small recording device that is used to detect abnormal heart rhythms (arrhythmias).  Make sure you understand how to send the information from the monitor to your health care provider.  It is important to press the button on the monitor when you have any heart-related symptoms.  Keep a diary of your activities, such as walking, doing chores, and taking medicine. It is very important to note what you were doing when you pushed the button to record your symptoms. This will  help your health care provider learn what might be causing your symptoms. This information is not intended to replace advice given to you by your health care provider. Make sure you discuss any questions you have with your health care provider. Document Released: 07/12/2008 Document Revised: 09/15/2017 Document Reviewed: 09/17/2016 Elsevier Patient Education  2020 [employer]

## 2019-06-07 ENCOUNTER — Ambulatory Visit (INDEPENDENT_AMBULATORY_CARE_PROVIDER_SITE_OTHER): Payer: Medicare Other

## 2019-06-07 ENCOUNTER — Ambulatory Visit (HOSPITAL_COMMUNITY): Payer: Medicare Other | Attending: Cardiovascular Disease

## 2019-06-07 ENCOUNTER — Other Ambulatory Visit: Payer: Self-pay

## 2019-06-07 DIAGNOSIS — I2510 Atherosclerotic heart disease of native coronary artery without angina pectoris: Secondary | ICD-10-CM

## 2019-06-07 DIAGNOSIS — R7989 Other specified abnormal findings of blood chemistry: Secondary | ICD-10-CM | POA: Insufficient documentation

## 2019-06-07 DIAGNOSIS — I4891 Unspecified atrial fibrillation: Secondary | ICD-10-CM | POA: Diagnosis present

## 2019-06-07 DIAGNOSIS — Z952 Presence of prosthetic heart valve: Secondary | ICD-10-CM

## 2019-06-07 DIAGNOSIS — I9789 Other postprocedural complications and disorders of the circulatory system, not elsewhere classified: Secondary | ICD-10-CM

## 2019-06-07 DIAGNOSIS — R55 Syncope and collapse: Secondary | ICD-10-CM | POA: Diagnosis present

## 2019-06-07 DIAGNOSIS — I739 Peripheral vascular disease, unspecified: Secondary | ICD-10-CM | POA: Insufficient documentation

## 2019-06-07 DIAGNOSIS — I5042 Chronic combined systolic (congestive) and diastolic (congestive) heart failure: Secondary | ICD-10-CM

## 2019-06-10 NOTE — Progress Notes (Signed)
Please arrange an earlier visit. "per Dr. Angelena Form, his LVEF was around 35% on the preop echo. I suspect that it is overall probably not that much different. The TEE is interpreted by the anesthesiologist. Agree we can see him back sooner to adjust medications."

## 2019-06-11 ENCOUNTER — Telehealth: Payer: Self-pay | Admitting: *Deleted

## 2019-06-11 ENCOUNTER — Telehealth: Payer: Self-pay | Admitting: Physician Assistant

## 2019-06-11 NOTE — Telephone Encounter (Signed)
° ° °  Spouse calling for echo results

## 2019-06-11 NOTE — Telephone Encounter (Signed)
Returned [employer].  She has been made aware that the sooner appt is to discuss the echocardiogram results. She was grateful for the call.

## 2019-06-11 NOTE — Telephone Encounter (Signed)
-----   Message from Burnell Blanks, MD sent at 06/11/2019  3:45 PM EDT ----- A virtual visit would be fine.  Thanks ----- Message ----- From: Jeanann Lewandowsky, RMA Sent: 06/11/2019   8:53 AM EDT To: Burnell Blanks, MD  Dr. Angelena Form, Would this pt be appropriate for a virtual visit sooner?  Please advise!  Thank you!

## 2019-06-11 NOTE — Telephone Encounter (Signed)
Virtual Visit Pre-Appointment Phone Call  "(Name), I am calling you today to discuss your upcoming appointment. We are currently trying to limit exposure to the virus that causes COVID-19 by seeing patients at home rather than in the office."  1. "What is the BEST phone number to call the day of the visit?" - include this in appointment notes  2. "Do you have or have access to (through a family member/friend) a smartphone with video capability that we can use for your visit?" a. If yes - list this number in appt notes as "cell" (if different from BEST phone #) and list the appointment type as a VIDEO visit in appointment notes b. If no - list the appointment type as a PHONE visit in appointment notes  3. Confirm consent - "In the setting of the current Covid19 crisis, you are scheduled for a (phone or video) visit with your provider on (date) at (time).  Just as we do with many in-office visits, in order for you to participate in this visit, we must obtain consent.  If you'd like, I can send this to your mychart (if signed up) or email for you to review.  Otherwise, I can obtain your verbal consent now.  All virtual visits are billed to your insurance company just like a normal visit would be.  By agreeing to a virtual visit, we'd like you to understand that the technology does not allow for your provider to perform an examination, and thus may limit your provider's ability to fully assess your condition. If your provider identifies any concerns that need to be evaluated in person, we will make arrangements to do so.  Finally, though the technology is pretty good, we cannot assure that it will always work on either your or our end, and in the setting of a video visit, we may have to convert it to a phone-only visit.  In either situation, we cannot ensure that we have a secure connection.  Are you willing to proceed?" STAFF: Did the patient verbally acknowledge consent to telehealth visit? Document  YES/NO here: YES  4. Advise patient to be prepared - "Two hours prior to your appointment, go ahead and check your blood pressure, pulse, oxygen saturation, and your weight (if you have the equipment to check those) and write them all down. When your visit starts, your provider will ask you for this information. If you have an Apple Watch or Kardia device, please plan to have heart rate information ready on the day of your appointment. Please have a pen and paper handy nearby the day of the visit as well."  5. Give patient instructions for MyChart download to smartphone OR Doximity/Doxy.me as below if video visit (depending on what platform provider is using)  6. Inform patient they will receive a phone call 15 minutes prior to their appointment time (may be from unknown caller ID) so they should be prepared to answer    TELEPHONE CALL NOTE  Dennis Gomez has been deemed a candidate for a follow-up tele-health visit to limit community exposure during the Covid-19 pandemic. I spoke with the patient via phone to ensure availability of phone/video source, confirm preferred email & phone number, and discuss instructions and expectations.  I reminded Dennis Gomez to be prepared with any vital sign and/or heart rhythm information that could potentially be obtained via home monitoring, at the time of his visit. I reminded Dennis Gomez to expect a phone call prior to his visit.  Dennis Gomez, RMA 06/11/2019 4:21 PM   INSTRUCTIONS FOR DOWNLOADING THE MYCHART APP TO SMARTPHONE  - The patient must first make sure to have activated MyChart and know their login information - If Apple, go to [employer] and type in MyChart in the search bar and download the app. If Android, ask patient to go to [employer] and type in [place] in the search bar and download the app. The app is free but as with any other app downloads, their phone may require them to verify saved payment information or  Apple/Android password.  - The patient will need to then log into the app with their MyChart username and password, and select [facility] as their healthcare provider to link the account. When it is time for your visit, go to the MyChart app, find appointments, and click Begin Video Visit. Be sure to Select Allow for your device to access the Microphone and Camera for your visit. You will then be connected, and your provider will be with you shortly.  **If they have any issues connecting, or need assistance please contact MyChart service desk [phone])**  **If using a computer, in order to ensure the best quality for their visit they will need to use either of the following Internet Browsers: [employer] or Google Chrome**  IF USING DOXIMITY or DOXY.ME - The patient will receive a link just prior to their visit by text.     FULL LENGTH CONSENT FOR TELE-HEALTH VISIT   I hereby voluntarily request, consent and authorize [facility] and its employed or contracted physicians, physician assistants, nurse practitioners or other licensed health care professionals (the Practitioner), to provide me with telemedicine health care services (the "Services") as deemed necessary by the treating Practitioner. I acknowledge and consent to receive the Services by the Practitioner via telemedicine. I understand that the telemedicine visit will involve communicating with the Practitioner through live audiovisual communication technology and the disclosure of certain medical information by electronic transmission. I acknowledge that I have been given the opportunity to request an in-person assessment or other available alternative prior to the telemedicine visit and am voluntarily participating in the telemedicine visit.  I understand that I have the right to withhold or withdraw my consent to the use of telemedicine in the course of my care at any time, without affecting my right to future care  or treatment, and that the Practitioner or I may terminate the telemedicine visit at any time. I understand that I have the right to inspect all information obtained and/or recorded in the course of the telemedicine visit and may receive copies of available information for a reasonable fee.  I understand that some of the potential risks of receiving the Services via telemedicine include:  [name] Delay or interruption in medical evaluation due to technological equipment failure or disruption; . Information transmitted may not be sufficient (e.g. poor resolution of images) to allow for appropriate medical decision making by the Practitioner; and/or  . In rare instances, security protocols could fail, causing a breach of personal health information.  Furthermore, I acknowledge that it is my responsibility to provide information about my medical history, conditions and care that is complete and accurate to the best of my ability. I acknowledge that Practitioner's advice, recommendations, and/or decision may be based on factors not within their control, such as incomplete or inaccurate data provided by me or distortions of diagnostic images or specimens that may result from electronic transmissions. I understand that the  practice of medicine is not an Chief Strategy Officer and that Practitioner makes no warranties or guarantees regarding treatment outcomes. I acknowledge that I will receive a copy of this consent concurrently upon execution via email to the email address I last provided but may also request a printed copy by calling the office of [place].    I understand that my insurance will be billed for this visit.   I have read or had this consent read to me. . I understand the contents of this consent, which adequately explains the benefits and risks of the Services being provided via telemedicine.  . I have been provided ample opportunity to ask questions regarding this consent and the Services and have had  my questions answered to my satisfaction. . I give my informed consent for the services to be provided through the use of telemedicine in my medical care  By participating in this telemedicine visit I agree to the above.

## 2019-06-14 ENCOUNTER — Other Ambulatory Visit: Payer: Self-pay | Admitting: Thoracic Surgery (Cardiothoracic Vascular Surgery)

## 2019-06-14 DIAGNOSIS — Z951 Presence of aortocoronary bypass graft: Secondary | ICD-10-CM

## 2019-06-16 ENCOUNTER — Encounter: Payer: Self-pay | Admitting: Physician Assistant

## 2019-06-16 NOTE — Progress Notes (Signed)
Virtual Visit via Telephone Note   This visit type was conducted due to national recommendations for restrictions regarding the COVID-19 Pandemic (e.g. social distancing) in an effort to limit this patient's exposure and mitigate transmission in our community.  Due to his co-morbid illnesses, this patient is at least at moderate risk for complications without adequate follow up.  This format is felt to be most appropriate for this patient at this time.  The patient did not have access to video technology/had technical difficulties with video requiring transitioning to audio format only (telephone).  All issues noted in this document were discussed and addressed.  No physical exam could be performed with this format.  Please refer to the patient's chart for his  consent to telehealth for [facility].   Date:  06/17/2019   ID:  Dennis Gomez, DOB 1951-07-20, MRN [id number]  Patient Location: Home Provider Location: Home  PCP:  [name], MD  Cardiologist:  [name], MD  Electrophysiologist:  None   Evaluation Performed:  Follow-Up Visit  Chief Complaint:  Discuss readdition of CHF med titration  History of Present Illness:    Dennis Gomez is a 68 y.o. male with COPD, tobacco abuse, hypothroidism and recently diagnosed HTN, acute CHF/LV dysfunction (EF 35-40%), moderate-severe aortic regurgitation s/p pericardial AVR, CAD s/p CABG who presents for f/u to discuss echocardiogram.  In July 2020 he was transferred from [facility] to [facility] with chest pressure, leg swelling, diaphoresis, and mildly elevated troponin. His echo at [facility] was reported to have shown a severely dilated left ventricle with moderate concentric LVH, EF 35 to 40%, moderate-severe aortic insufficiency. His echocardiogram on 05/17/19 at [facility] was read as EF 45-50%, mild LVH, diffuse HK, normal RV, mild LAE/RAE, moderate-severe AI, mild dilation of aortic root and ascending aorta. He  underwent cardiac cath showing severe multivessel disease with moderately severe AI and normal right heart hemodynamics with low right-sided filling pressures. Coronary CT showed bicuspid aortic valve with mild ascending aortic root enlargement. He underwent bovine pericardial AVR and CABGx4 on 05/21/19. Intra-op TEE reported to show normalized EF, interpreted by anesthesia. He did have episodes of post-op AF treated with amiodarone, which he was discharged on. He was not felt to require anticoagulation. Post-op course also notable for anemia/thrombocytopenia and low co-ox requiring milrinone. Last labs 05/2019 showed Hgb 8.7, plt 127, K 4.3, Cr 1.20, normal LFTs, no lipid profile on file. IM note indicates TSH at [facility] was 79 but does not appear thyroid dose was adjusted upon discharge. Pre-op R/L ABIs indicated mild lower extremity arterial disease and carotid duplex showed 1-39% stenosis.   He has had a tumultuous post-op course complicated by hypotension with SBPs in the 80s per home health telephone note. He was seen at [facility] twice 06/01/19 with 2 syncopal spells. The first time he'd gotten up around 3am and passed out and fell into a cabinet. He went to [place] and got 66 dissolvable stitches. When he got home after being discharged from the ED, he was getting out of the truck and his eyes rolled back in his head, lips turned blue and he passed out. EMS was called at pt was transported back to [place]. His labs were nonacute, Hgb in the 9, Cr 0.9. CT of the head did not show any intracranial bleeding. His pulse ox was reported to be low. EKG reported to show NSR. Wife reports EMS said BP was 100/38. His BP had been intermittently soft so losartan and  spironolactone were discontinued. He was also asked to remain off Lasix. There was question that he could have had adverse reaction to hydrocodone as well, maybe contributing to low O2 sat, so this was discontinued. When I saw him in f/u 06/05/19, he was  feeling better overall. I ordered a CT angio given his syncope/post-op state which ruled out PE. His BP was 128/74. Event monitor was ordered (in process). 2D echo 06/07/19 showed EF 25-30%, appearing worse since surgery, with reasonable post AVR gradients. While I was out of the office, my colleague [name] reviewed with Dr. [name] and per his report, "his LVEF was around 35% on the preop echo. I suspect that it is overall probably not that much different. The TEE is interpreted by the anesthesiologist (this as reported to be normal intraoperatively)." They agreed patient should be seen back earlier than planned to discuss re-introduction of CHF medications. I had ordered labs that day but do not see the patient got them done.  He is seen back virtually with the aide of his son Dennis Gomez and wife. He remains a man of few words similar to last visit and does not think he has anything wrong, symptom wise. The patient's family helped him through orthostatics below but they also report they are not sure if his blood pressure cuff is accurate as it has had very different readings all over the place lately. He did not have any dizziness or syncope upon standing. His family noted him to "sway" upon standing but he does not feel that was for any particular reason. He is eating OK per their report but is losing weight and not drinking much. The patient does not have symptoms concerning for COVID-19 infection (fever, chills, cough, or new shortness of breath).    Orthostatic VS for the past 72 hrs (Last 3 readings):  Orthostatic BP Patient Position Orthostatic Pulse  06/17/19 1558 (!) 74/58 Standing 76  06/17/19 1557 (!) 83/64 Sitting 68  06/17/19 1556 125/74 Supine 66     Past Medical History:  Diagnosis Date   Aortic regurgitation    Chronic combined systolic and diastolic CHF (congestive heart failure) (HCC)    a. LVEF 35-40% at [facility] in 04/2019, 45-50% [facility] pre-AVR. 25-30% by repeat echo 05/2019.    COPD (chronic obstructive pulmonary disease) (HCC)    Coronary artery disease    a. s/p CABGx4 in 05/2019.   Essential hypertension    Hypothyroid    PAD (peripheral artery disease) (HCC)    a. R/L ABIs indicated mild lower extremity arterial disease and carotid duplex showed 1-39% stenosis.   Postoperative atrial fibrillation (HCC)    S/P aortic valve replacement with bioprosthetic valve 05/21/2019   25 mm Edwards Inspiris Resilia stented bovine pericardial tissue valve   S/P CABG x 4 05/21/2019   LIMA to LAD, SVG to D1, SVG to OM, SVG to LPDA with EVH via right thigh and leg   Tobacco abuse    Past Surgical History:  Procedure Laterality Date   AORTIC VALVE REPLACEMENT N/A 05/21/2019   Procedure: AORTIC VALVE REPLACEMENT (AVR) using Inspiris Valve size 25mm;  Surgeon: Purcell Nailswen, [name], MD;  [facility];  Service: Open Heart Surgery;  Laterality: N/A;   CORONARY ARTERY BYPASS GRAFT N/A 05/21/2019   Procedure: CORONARY ARTERY BYPASS GRAFTING (CABG) x Four , using left internal mammary artery and right leg greater saphenous vein harvested endoscopically;  Surgeon: Purcell Nailswen, [name], MD;  [facility];  Service: Open Heart  Surgery;  Laterality: N/A;   RIGHT/LEFT HEART CATH AND CORONARY ANGIOGRAPHY N/A 05/17/2019   Procedure: RIGHT/LEFT HEART CATH AND CORONARY ANGIOGRAPHY;  Surgeon: Sherren Mocha, MD;  Location: [place] [facility] LAB;  Service: Cardiovascular;  Laterality: N/A;   TEE WITHOUT CARDIOVERSION N/A 05/21/2019   Procedure: TRANSESOPHAGEAL ECHOCARDIOGRAM (TEE);  Surgeon: Rexene Alberts, MD;  Location: [place];  Service: Open Heart Surgery;  Laterality: N/A;     Current Meds  Medication Sig   amiodarone (PACERONE) 200 MG tablet Take 1 tablet (200 mg total) by mouth 2 (two) times daily after a meal. X 7 days, then decrease to 200 mg daily (Patient taking differently: Take 200 mg by mouth daily. X 7 days, then decrease to 200 mg daily)   aspirin EC 325 MG EC tablet Take 1  tablet (325 mg total) by mouth daily.   atorvastatin (LIPITOR) 80 MG tablet Take 1 tablet (80 mg total) by mouth daily at 6 PM.   dextromethorphan-guaiFENesin (MUCINEX DM) 30-600 MG 12hr tablet Take 1 tablet by mouth 2 (two) times daily as needed for cough.   levothyroxine (SYNTHROID) 100 MCG tablet Take 100 mcg by mouth daily.     Allergies:   Hydrocodone   Social History   Tobacco Use   Smoking status: Current Every Day Smoker    Types: Cigarettes   Smokeless tobacco: Never Used  Substance Use Topics   Alcohol use: Not Currently   Drug use: Not on file     Family Hx: The patient's family history includes COPD in his father; Cancer in his mother.  ROS:   Please see the history of present illness.    All other systems reviewed and are negative.   Prior CV studies:    Most recent pertinent cardiac studies are outlined above.  Labs/Other Tests and Data Reviewed:    EKG:  An ECG dated 06/05/19 was personally reviewed today and demonstrated:  NSR 1st degree AVB, left axis deviation, NSIVCD, cannot r/o prior inferior infarct, nonspecific STT changes. Stable.  Recent Labs: 05/18/2019: ALT 11 05/22/2019: Magnesium 3.1 05/26/2019: BUN 26; Creatinine, Ser 0.91; Hemoglobin 8.7; Platelets 127; Potassium 3.9; Sodium 141   Recent Lipid Panel No results found for: CHOL, TRIG, HDL, CHOLHDL, LDLCALC, LDLDIRECT  Wt Readings from Last 3 Encounters:  06/17/19 121 lb (54.9 kg)  06/17/19 121 lb (54.9 kg)  06/05/19 130 lb 12.8 oz (59.3 kg)     Objective:    Vital Signs:  BP as noted above   Pulse 63    Ht 5\' 5"  (1.651 m)    Wt 121 lb (54.9 kg)    SpO2 97%    BMI 20.14 kg/m    VS reviewed. General - pleasant stoic M in no acute distress Pulm - No labored breathing, no coughing during visit, no audible wheezing, speaking in full sentences Neuro - A+Ox3, no slurred speech, answers questions appropriately Psych - Pleasant affect     ASSESSMENT & PLAN:    1. Syncope - suspect  original 2 episodes were precipitated by hypotension, with losartan, spironolactone and Lasix now discontinued. Event monitor is in progress. Today's visit was arranged to f/u to determine if CHF meds should be added back given LV dysfunction. His orthostatics were notable as above but family is not sure if their BP cuff is reliable. He reports poor oral fluid intake and weight loss. I encouraged him to push fluids. Will have him come into the office tomorrow for orthostatics with a nurse check,  along with following up the labs we had intended him to get at his original office visit. I will be out of the office tomorrow so this information will be taken to our DOD - if he is truly orthostatic upon standing, would recommend he be instructed to use compression hose, increase water intake (but not go overboard given his LV dysfunction), and consider addition of midodrine. Would recommend Dr. [name] also be made aware of these results as well when nurse visit is complete. Of note he is reported to have had elevated T[facility] at [facility] so if labs tomorrow confirm undertreated hypothyroidism, this certainly could be impacting BP.  2. Chronic combined CHF - Dr. [name] reviewed echo and did not feel it was that far different from pre-echocardiogram. Med titration is presently limited by his hypotension, pending evaluation above.  3. CAD s/p CABG and pericardial AVR - no recurrent anginal symptoms. Continue full dose ASA, await input on surgery when this can be reduced to 81mg  daily. No BB given sinus bradycardia. Continue statin. If tolerating statin at f/u visit, anticipate getting lipids at that time. Endocarditis precautions discussed at last OV and also with CVTS today. 4. Post-op AFib - in NSR at OV 8/19, wearing monitor for syncope. Anticipate discontinuation of amiodarone after 3 months if maintaiing NSR. He was not felt to require anticoagulation in the hospital. 5. PAD - follow clinically, anticipate yearly f/u  of dilated aortic root and carotid disease. This can be re-evaluated closer to that time. 6. Abnormal TSH - plan recheck with labs tomorrow. If he does in fact have hypothyroidism this could be contributing to orthostasis as well.  COVID-19 Education: The signs and symptoms of COVID-19 were discussed with the patient and how to seek care for testing (follow up with PCP or arrange E-visit).  The importance of social distancing was discussed today.  Time:   Today, I have spent 19 minutes with the patient with telehealth technology discussing the above problems.    Medication Adjustments/Labs and Tests Ordered: Current medicines are reviewed at length with the patient today.  Concerns regarding medicines are outlined above.   Disposition:  Will move f/u in-person follow-up up from October to next week with me to reassess BP.  Signed, Laurann Montanaayna N Loudon Krakow, PA-C  06/17/2019 4:03 PM    [facility]

## 2019-06-17 ENCOUNTER — Ambulatory Visit (INDEPENDENT_AMBULATORY_CARE_PROVIDER_SITE_OTHER): Payer: Self-pay | Admitting: Physician Assistant

## 2019-06-17 ENCOUNTER — Other Ambulatory Visit: Payer: Self-pay

## 2019-06-17 ENCOUNTER — Encounter: Payer: Self-pay | Admitting: Physician Assistant

## 2019-06-17 ENCOUNTER — Ambulatory Visit
Admission: RE | Admit: 2019-06-17 | Discharge: 2019-06-17 | Disposition: A | Discharge status: Home / Self Care | Payer: Medicare Other | Source: Ambulatory Visit | Attending: Thoracic Surgery (Cardiothoracic Vascular Surgery) | Admitting: Thoracic Surgery (Cardiothoracic Vascular Surgery)

## 2019-06-17 ENCOUNTER — Telehealth (INDEPENDENT_AMBULATORY_CARE_PROVIDER_SITE_OTHER): Payer: Medicare Other | Admitting: Physician Assistant

## 2019-06-17 VITALS — BP 155/85 | HR 63 | Wt 121.0 lb

## 2019-06-17 VITALS — BP 155/85 | HR 63 | Ht 65.0 in | Wt 121.0 lb

## 2019-06-17 DIAGNOSIS — I5042 Chronic combined systolic (congestive) and diastolic (congestive) heart failure: Secondary | ICD-10-CM

## 2019-06-17 DIAGNOSIS — Z952 Presence of prosthetic heart valve: Secondary | ICD-10-CM

## 2019-06-17 DIAGNOSIS — R55 Syncope and collapse: Secondary | ICD-10-CM

## 2019-06-17 DIAGNOSIS — Z951 Presence of aortocoronary bypass graft: Secondary | ICD-10-CM

## 2019-06-17 DIAGNOSIS — I4891 Unspecified atrial fibrillation: Secondary | ICD-10-CM

## 2019-06-17 DIAGNOSIS — I9789 Other postprocedural complications and disorders of the circulatory system, not elsewhere classified: Secondary | ICD-10-CM

## 2019-06-17 DIAGNOSIS — Z953 Presence of xenogenic heart valve: Secondary | ICD-10-CM

## 2019-06-17 DIAGNOSIS — I739 Peripheral vascular disease, unspecified: Secondary | ICD-10-CM

## 2019-06-17 DIAGNOSIS — R7989 Other specified abnormal findings of blood chemistry: Secondary | ICD-10-CM

## 2019-06-17 HISTORY — DX: Syncope and collapse: R55

## 2019-06-17 MED ORDER — DM-GUAIFENESIN ER 30-600 MG PO TB12: 1.0000 | ORAL_TABLET | Freq: Two times a day (BID) | ORAL | Status: DC | PRN

## 2019-06-17 NOTE — Patient Instructions (Signed)
Medication Instructions:  Your physician recommends that you continue on your current medications as directed. Please refer to the Current Medication list given to you today.  If you need a refill on your cardiac medications before your next appointment, please call your pharmacy.   Lab work: 06/18/2019:  BMET, CBC, MAG, TSH, & FREE T4  If you have labs (blood work) drawn today and your tests are completely normal, you will receive your results only by: [name] MyChart Message (if you have MyChart) OR . A paper copy in the mail If you have any lab test that is abnormal or we need to change your treatment, we will call you to review the results.  Testing/Procedures: None ordered  Follow-Up: At [facility], you and your health needs are our priority.  As part of our continuing mission to provide you with exceptional heart care, we have created designated Provider Care Teams.  These Care Teams include your primary Cardiologist (physician) and Advanced Practice Providers (APPs -  Physician Assistants and Nurse Practitioners) who all work together to provide you with the care you need, when you need it. PLEASE ARRIVE AT 1:45 TOMORROW, 06/18/2019 FOR A NURSE VISIT TO CHECK YOUR BP / ORTHOSTATICS  Your physician recommends that you schedule a follow-up appointment in: 06/27/2019 ARRIVE AT 1:30 TO SEE DAYNA DUNN, PA-C IN THE OFFICE  Any Other Special Instructions Will Be Listed Below (If Applicable).

## 2019-06-17 NOTE — Progress Notes (Signed)
HPI:  Patient returns for routine postoperative follow-up having undergone an AVR and CABG x 4 by Dr. Roxy Manns on 05/21/2019. The patient's early postoperative recovery while in the hospital was notable for atrial fibrillation, for which he was put on Amiodarone. Since hospital discharge, the patient reports he has had 2 syncopal episodes. The first one occurred 08/15. He went to [facility] and received many stitches. Upon being discharged home, as he was getting out of the truck, he had another syncopal episode. Apparently, his eyes rolled back in his head and lips turned blue and he passed out. EMS was called and pt was transported back to [facility]. His labs were non acute, Hgb in the 9, Cr 0.9. CT of the head did not show any intracranial bleeding. His pulse ox was reported to be low. EKG reported to show NSR. Wife reports EMS said BP was 100/38. His BP had been intermittently soft so losartan and spironolactone were discontinued. There is also question that he could have had adverse reaction to hydrocodone as well. He was seen by cardiology on 08/19 and a 30 day monitor  and echocardiogram were arranged. Wife reports he has had no chest pain, dizziness, shortness of breath, or further syncopal episodes.  Current Outpatient Medications  Medication Sig Dispense Refill  . amiodarone (PACERONE) 200 MG tablet Take 1 tablet (200 mg total) by mouth 2 (two) times daily after a meal. X 7 days, then decrease to 200 mg daily 60 tablet 1  . aspirin EC 325 MG EC tablet Take 1 tablet (325 mg total) by mouth daily. 30 tablet 0  . atorvastatin (LIPITOR) 80 MG tablet Take 1 tablet (80 mg total) by mouth daily at 6 PM. 30 tablet 3  . dextromethorphan-guaiFENesin (MUCINEX DM) 30-600 MG 12hr tablet Take 1 tablet by mouth 2 (two) times daily.    . ferrous [id number] C-folic acid (TRINSICON / FOLTRIN) capsule Take 1 capsule by mouth 3 (three) times daily after meals. 90 capsule 0  . furosemide  (LASIX) 40 MG tablet Take 1 tablet (40 mg total) by mouth daily. 7 tablet 0  . levothyroxine (SYNTHROID) 100 MCG tablet Take 100 mcg by mouth daily.    . traMADol (ULTRAM) 50 MG tablet Take 1-2 tablets (50-100 mg total) by mouth every 4 (four) hours as needed for moderate pain. 30 tablet 0  . TRELEGY ELLIPTA 100-62.5-25 MCG/INH AEPB Take 1 puff by mouth daily.    Vital Signs: BP 155/85 (by machine;manually on right 130/70 and on left 140/80), HR 63, Oxygenation 97% on room air   Physical Exam: CV-RRR Pulmonary-Clear to auscultation bilaterally Abdomen-Soft, non tender, bowel sounds present Extremities-No LE edema Wounds-Clean and dry. Small area of swelling groin (from [facility] site). Eschar from left chest tube site removed  Diagnostic Tests: CLINICAL DATA:  S/p AVR and CABG 05/21/2019, no chest complaints now, pt is wearing a heart monitor today, stat reading, pt gone to office now for appt  EXAM: CHEST - 2 VIEW  COMPARISON:  Chest radiograph 06/01/2019  FINDINGS: Normal heart size. Surgical changes status post median sternotomy, valve replacement and CABG. An external device projects over the mid left chest. Minimal residual opacities in the left greater than right lung bases likely reflect atelectasis or scarring. No pleural effusion or pneumothorax. Degenerative changes are seen in the thoracic spine.  IMPRESSION: Minimal residual opacities at the left greater than right lung base likely reflect atelectasis or scarring.   Electronically Signed   By: Audie Pinto  M.D.   On: 06/17/2019 12:25  Impression and Plan: Patient has an appointment to see cardiology today. It is a virtual visit, but they will discuss echo results. Of note, LVEF decreased from surgery but on PVL and AVR has reasonable gradients. Patient's wife wants to know monitor results as well. The only changes made to his medications were to take Mucinex PRN, stop Trinsicon (causing constipation), and stop  Ultram (not taking). He will return to see Dr. Cornelius Moras in 4-6 weeks.   Ardelle Balls, PA-C [facility] [phone]

## 2019-06-17 NOTE — Patient Instructions (Signed)
It is advisable not to drive until cardiology has evaluated and cleared you secondary to previous syncopal episodes.    You are encouraged to enroll and participate in the outpatient cardiac rehab program beginning as soon as practical.Endocarditis is a potentially serious infection of heart valves or inside lining of the heart.  It occurs more commonly in patients with diseased heart valves (such as patient's with aortic or mitral valve disease) and in patients who have undergone heart valve repair or replacement.  Certain surgical and dental procedures may put you at risk, such as dental cleaning, other dental procedures, or any surgery involving the respiratory, urinary, gastrointestinal tract, gallbladder or prostate gland.   To minimize your chances for develooping endocarditis, maintain good oral health and seek prompt medical attention for any infections involving the mouth, teeth, gums, skin or urinary tract.    Always notify your doctor or dentist about your underlying heart valve condition before having any invasive procedures. You will need to take antibiotics before certain procedures, including all routine dental cleanings or other dental procedures.  Your cardiologist or dentist should prescribe these antibiotics for you to be taken ahead of time.   You may continue to gradually increase your physical activity as tolerated.  Refrain from any heavy lifting or strenuous use of your arms and shoulders until at least 8 weeks from the time of your surgery, and avoid activities that cause increased pain in your chest on the side of your surgical incision.  Otherwise, you may continue to increase activities without any particular limitations.  Increase the intensity and duration of physical activity gradually.

## 2019-06-18 ENCOUNTER — Ambulatory Visit (INDEPENDENT_AMBULATORY_CARE_PROVIDER_SITE_OTHER): Payer: Medicare Other

## 2019-06-18 ENCOUNTER — Other Ambulatory Visit: Payer: Self-pay | Admitting: Physician Assistant

## 2019-06-18 ENCOUNTER — Other Ambulatory Visit: Payer: Medicare Other

## 2019-06-18 VITALS — BP 129/74 | HR 77 | Wt 130.0 lb

## 2019-06-18 DIAGNOSIS — I959 Hypotension, unspecified: Secondary | ICD-10-CM | POA: Diagnosis not present

## 2019-06-18 NOTE — Progress Notes (Signed)
1.) Reason for visit: BP check  2.) Name of MD requesting visit: Dayna Dunn/ Dr Angelena Form  3.) H&P: see chart  4.) ROS related to problem: Orthostatic BP check   5.) Assessment and plan per MD: The patient returned today 9/1 for Orthostatic BP checks and labs.  The patient is asymptomatic.  Vitals:  BP laying:  129/74 HR 77;  Sitting:  118/72 HR 76;  Standing:  109/69 HR 78; Patient's BP system 121/81 HR 82;   6.) Recommendations per Dr Marlou Porch (DOD):  Stay well hydrated with water and limit caffeine intake.  Will forward to Dr Isabelle Course

## 2019-06-19 ENCOUNTER — Other Ambulatory Visit: Payer: Self-pay

## 2019-06-19 ENCOUNTER — Telehealth: Payer: Self-pay

## 2019-06-19 ENCOUNTER — Other Ambulatory Visit: Payer: Medicare Other | Admitting: *Deleted

## 2019-06-19 DIAGNOSIS — E875 Hyperkalemia: Secondary | ICD-10-CM

## 2019-06-19 LAB — CBC
Hematocrit: 32.6 % — ABNORMAL LOW (ref 37.5–51.0)
Hemoglobin: 10.4 g/dL — ABNORMAL LOW (ref 13.0–17.7)
MCH: 31.1 pg (ref 26.6–33.0)
MCHC: 31.9 g/dL (ref 31.5–35.7)
MCV: 98 fL — ABNORMAL HIGH (ref 79–97)
Platelets: 264 10*3/uL (ref 150–450)
RBC: 3.34 x10E6/uL — ABNORMAL LOW (ref 4.14–5.80)
RDW: 14.7 % (ref 11.6–15.4)
WBC: 9 10*3/uL (ref 3.4–10.8)

## 2019-06-19 LAB — BASIC METABOLIC PANEL
BUN/Creatinine Ratio: 18 (ref 10–24)
BUN: 17 mg/dL (ref 8–27)
CO2: 24 mmol/L (ref 20–29)
Calcium: 9.7 mg/dL (ref 8.6–10.2)
Chloride: 103 mmol/L (ref 96–106)
Creatinine, Ser: 0.95 mg/dL (ref 0.76–1.27)
GFR calc Af Amer: 97 mL/min/{1.73_m2} (ref >59)
GFR calc non Af Amer: 84 mL/min/{1.73_m2} (ref >59)
Glucose: 88 mg/dL (ref 65–99)
Potassium: 5.5 mmol/L — ABNORMAL HIGH (ref 3.5–5.2)
Sodium: 140 mmol/L (ref 134–144)

## 2019-06-19 LAB — T4, FREE: Free T4: 1.56 ng/dL (ref 0.82–1.77)

## 2019-06-19 LAB — TSH: TSH: 15.1 u[IU]/mL — ABNORMAL HIGH (ref 0.450–4.500)

## 2019-06-19 LAB — MAGNESIUM: Magnesium: 2.1 mg/dL (ref 1.6–2.3)

## 2019-06-19 NOTE — Telephone Encounter (Signed)
-----   Message from Charlie Pitter, [place] sent at 06/19/2019 11:21 AM EDT ----- Patient recently underwent heart surgery (CABG, AVR) and had syncope after discharge likely due to hypotension.   Orthostatics reviewed yesterday - much better than they had looked when patient had checked at home. Would not add any additional CHF medications at this time given that he tends to have lower BP upon standing.  His labs do show hyperkalemia of unclear cause - I am skeptical this is real because his other values have been normal. Can he get a repeat potassium level today? Of note, I notice he lives in [place]. He was originally put on my schedule because Dr. Angelena Form had seen him in the hospital, but with his recent issues it might make most sense to see if we can transition his care closer to his home to make it easier on him and his family. If our [place] team can get the potassium level that would be great - can also see if they are able to accommodate a f/u appt in the next 1-2 weeks in their office to establish care. If they would rather stick to the [place] office, would keep appt as planned.   His anemia seems to be improving, so that's good. His TSH was elevated but free T4 was totally normal so he should have ongoing f/u with his primary care for his thyroid disease, especially given recent BP issues.  Dayna Dunn PA-C

## 2019-06-19 NOTE — Telephone Encounter (Signed)
Notes recorded by Frederik Schmidt, RN on 06/19/2019 at 11:32 AM EDT  The patient has been notified of the result and verbalized understanding. All questions (if any) were answered.  Frederik Schmidt, RN 06/19/2019 11:32 AM   The patient is willing to come into the [facility] office today for potassium lab and next OV.  After his next appointment with Dayna next week 9/10, he would be willing to go to the [place] office for future visits.

## 2019-06-20 ENCOUNTER — Telehealth: Payer: Self-pay

## 2019-06-20 LAB — POTASSIUM: Potassium: 4.2 mmol/L (ref 3.5–5.2)

## 2019-06-20 NOTE — Telephone Encounter (Signed)
The patient has been notified of the result and verbalized understanding.  All questions (if any) were answered. Wilma Flavin, RN 06/20/2019 8:56 AM

## 2019-06-26 NOTE — Progress Notes (Deleted)
Cardiology Office Note    Date:  06/26/2019   ID:  Dennis Gomez, DOB 09/17/1951, MRN [id number]  PCP:  [name], MD  Cardiologist:  [name], MD (but will be establishing closer to [facility]) Electrophysiologist:  None   Chief Complaint: f/u orthostasis  History of Present Illness:   Dennis Gomez is a 68 y.o. male with history of COPD, tobacco abuse, hypothroidism and recently diagnosed HTN, acute CHF/LV dysfunction (EF 35-40%), moderate-severe aortic regurgitation s/p pericardial AVR, CAD s/p CABG who presents for f/u of blood pressure.  In July 2020 he was transferred from [facility] to [facility] with chest pressure, leg swelling, diaphoresis, and mildly elevated troponin. His echo at [facility] was reported to have shown a severely dilated left ventricle with moderate concentric LVH, EF 35 to 40%, moderate-severe aortic insufficiency. His echocardiogram on 05/17/19 at [facility] was read as EF 45-50%, mild LVH, diffuse HK, normal RV, mild LAE/RAE, moderate-severe AI, mild dilation of aortic root and ascending aorta. He underwent cardiac cath showing severe multivessel disease with moderately severe AI and normal right heart hemodynamics with low right-sided filling pressures. Coronary CT showed bicuspid aortic valve with mild ascending aortic root enlargement. He underwent bovine pericardial AVR and CABGx4 on 05/21/19. Intra-op TEE reported to show normalized EF, interpreted by anesthesia. He did have episodes of post-op AF treated with amiodarone, which he was discharged on. He was not felt to require anticoagulation. Post-op course also notable for anemia/thrombocytopenia and low co-ox requiring milrinone. Last labs 05/2019 showed Hgb 8.7, plt 127, K 4.3, Cr 1.20, normal LFTs, no lipid profile on file. IM note indicated TSH at [facility] was 79 but med not adjusted. Pre-op R/L ABIs indicated mild lower extremity arterial disease and carotid duplex showed 1-39% stenosis,  which will be managed medically for now with further f/u at discretion of primary cardiologist.   He has had a tumultuous post-op course complicated by hypotension and syncope. He had low BPs in the 80s per home health telephone note. He was seen at [facility] ER twice 06/01/19 with 2 syncopal spells. The first time he'd gotten up around 3am and passed out and fell into a cabinet. He went to [name] and got 66 dissolvable stitches. When he got home after being discharged from the ED, he was getting out of the truck and his eyes rolled back in his head, lips turned blue and he passed out. EMS was called and he was transported back to [place]. His labs were nonacute, Hgb around 9, Cr 0.9. CT of the head did not show any intracranial bleeding. His pulse ox was reported to be low. EKG reported to show NSR. Wife reported EMS BP was 100/38. His BP had been intermittently soft so losartan and spironolactone were discontinued. He was also asked to remain off Lasix. There was question that he could have had adverse reaction to hydrocodone as well, maybe contributing to low O2 sat, so this was discontinued. When I saw him in f/u 06/05/19, he was feeling better overall. I ordered a CT angio given his syncope/post-op state which ruled out PE. His BP was 128/74. Event monitor was ordered (in process). 2D echo 06/07/19 showed EF 25-30%, appearing worse since surgery, with reasonable post AVR gradients. While I was out of the office, my colleague Azalee Course reviewed with Dr. Clifton James and per his report, "his LVEF was around 35% on the preop echo. I suspect that it is overall probably not that much different. The TEE is interpreted  by the anesthesiologist (this as reported to be normal intraoperatively)." They agreed patient should be seen back earlier than planned to discuss re-introduction of CHF medications. I saw him back in tele-medicine on 06/17/19 accompanied by his son [name]. I had them check orthostatics over the phone and he  dropped his BP from 125/74 to 74/58 but they were unsure cuff was accurate. He did report losing weight and not drinking much. Oral hydration was encouraged and he had a nurse visit the next day to repeat orthostatics showing BP laying: 129/74 HR 77; Sitting:  118/72 HR 76; Standing:  109/69 HR 78; Patient's BP standing 24min 121/81 HR 82. No med changes were made at that time. Last labs showed K 4.2, TSH 15 with normal fT4, Hgb 10.4 (stable), Mg 2.1, Cr 0.95.  Lipids fdown the road  Syncope Chronic combined CHF CAD s/p CABG and pericardial AVR Post-op AFib - in NSR at [place] 8/19, wearing monitor for syncope Abnormal TSH    Past Medical History:  Diagnosis Date   Aortic regurgitation    Chronic combined systolic and diastolic CHF (congestive heart failure) ([place])    a. LVEF 35-40% at [facility] in 04/2019, 45-50% at [facility] pre-AVR. 25-30% by repeat echo 05/2019.   COPD (chronic obstructive pulmonary disease) (HCC)    Coronary artery disease    a. s/p CABGx4 in 05/2019.   Essential hypertension    Hypothyroid    PAD (peripheral artery disease) (HCC)    a. R/L ABIs indicated mild lower extremity arterial disease and carotid duplex showed 1-39% stenosis.   Postoperative atrial fibrillation (HCC)    S/P aortic valve replacement with bioprosthetic valve 05/21/2019   25 mm Edwards Inspiris Resilia stented bovine pericardial tissue valve   S/P CABG x 4 05/21/2019   LIMA to LAD, SVG to D1, SVG to OM, SVG to LPDA with EVH via right thigh and leg   Tobacco abuse     Past Surgical History:  Procedure Laterality Date   AORTIC VALVE REPLACEMENT N/A 05/21/2019   Procedure: AORTIC VALVE REPLACEMENT (AVR) using Inspiris Valve size 27mm;  Surgeon: Rexene Alberts, MD;  Location: [place];  Service: Open Heart Surgery;  Laterality: N/A;   CORONARY ARTERY BYPASS GRAFT N/A 05/21/2019   Procedure: CORONARY ARTERY BYPASS GRAFTING (CABG) x Four , using left internal mammary artery and right leg greater saphenous  vein harvested endoscopically;  Surgeon: Rexene Alberts, MD;  Location: [place];  Service: Open Heart Surgery;  Laterality: N/A;   RIGHT/LEFT HEART CATH AND CORONARY ANGIOGRAPHY N/A 05/17/2019   Procedure: RIGHT/LEFT HEART CATH AND CORONARY ANGIOGRAPHY;  Surgeon: Sherren Mocha, MD;  Location: [place] [facility];  Service: Cardiovascular;  Laterality: N/A;   TEE WITHOUT CARDIOVERSION N/A 05/21/2019   Procedure: TRANSESOPHAGEAL ECHOCARDIOGRAM (TEE);  Surgeon: Rexene Alberts, MD;  Location: [place];  Service: Open Heart Surgery;  Laterality: N/A;    Current Medications: No outpatient medications have been marked as taking for the 06/27/19 encounter (Appointment) with Charlie Pitter, PA-C.   ***   Allergies:   Hydrocodone   Social History   Socioeconomic History   Marital status: Married    Spouse name: Not on file   Number of children: Not on file   Years of education: Not on file   Highest education level: Not on file  Occupational History   Not on file  Social Needs   Financial resource strain: Not on file   Food insecurity    Worry: Not  on file    Inability: Not on file   Transportation needs    Medical: Not on file    Non-medical: Not on file  Tobacco Use   Smoking status: Current Every Day Smoker    Types: Cigarettes   Smokeless tobacco: Never Used  Substance and Sexual Activity   Alcohol use: Not Currently   Drug use: Not on file   Sexual activity: Not on file  Lifestyle   Physical activity    Days per week: Not on file    Minutes per session: Not on file   Stress: Not on file  Relationships   Social connections    Talks on phone: Not on file    Gets together: Not on file    Attends religious service: Not on file    Active member of club or organization: Not on file    Attends meetings of clubs or organizations: Not on file    Relationship status: Not on file  Other Topics Concern   Not on file  Social History Narrative   Not on file      Family History:  The patient's ***family history includes COPD in his father; Cancer in his mother.  ROS:   Please see the history of present illness. Otherwise, review of systems is positive for ***.  All other systems are reviewed and otherwise negative.    EKGs/Labs/Other Studies Reviewed:    Studies reviewed were summarized above.   EKG:  EKG is ordered today, personally reviewed, demonstrating ***  Recent Labs: 05/18/2019: ALT 11 06/18/2019: BUN 17; Creatinine, Ser 0.95; Hemoglobin 10.4; Magnesium 2.1; Platelets 264; Sodium 140; TSH 15.100 06/19/2019: Potassium 4.2  Recent Lipid Panel No results found for: CHOL, TRIG, HDL, CHOLHDL, VLDL, LDLCALC, LDLDIRECT  PHYSICAL EXAM:    VS:  There were no vitals taken for this visit.  BMI: There is no height or weight on file to calculate BMI.  GEN: Well nourished, well developed, in no acute distress HEENT: normocephalic, atraumatic Neck: no JVD, carotid bruits, or masses Cardiac: ***RRR; no murmurs, rubs, or gallops, no edema  Respiratory:  clear to auscultation bilaterally, normal work of breathing GI: soft, nontender, nondistended, + BS MS: no deformity or atrophy Skin: warm and dry, no rash Neuro:  Alert and Oriented x 3, Strength and sensation are intact, follows commands Psych: euthymic mood, full affect  Wt Readings from Last 3 Encounters:  06/18/19 130 lb (59 kg)  06/17/19 121 lb (54.9 kg)  06/17/19 121 lb (54.9 kg)     ASSESSMENT & PLAN:   1. ***  Disposition: F/u with ***   Medication Adjustments/Labs and Tests Ordered: Current medicines are reviewed at length with the patient today.  Concerns regarding medicines are outlined above. Medication changes, Labs and Tests ordered today are summarized above and listed in the Patient Instructions accessible in Encounters.   Signed, Laurann Montanaayna N Mindi Akerson, PA-C  06/26/2019 12:42 PM    [facility] [address], [place], [place]  [address] Phone: [phone])  [phone]; Fax: [phone]) [phone]

## 2019-06-27 ENCOUNTER — Other Ambulatory Visit: Payer: Self-pay

## 2019-06-27 ENCOUNTER — Ambulatory Visit (INDEPENDENT_AMBULATORY_CARE_PROVIDER_SITE_OTHER): Payer: Self-pay | Admitting: Physician Assistant

## 2019-06-27 ENCOUNTER — Telehealth: Payer: Self-pay | Admitting: Physician Assistant

## 2019-06-27 ENCOUNTER — Other Ambulatory Visit: Payer: Self-pay | Admitting: *Deleted

## 2019-06-27 ENCOUNTER — Encounter: Payer: Self-pay | Admitting: Cardiothoracic Surgery

## 2019-06-27 ENCOUNTER — Ambulatory Visit: Payer: Medicare Other | Admitting: Physician Assistant

## 2019-06-27 VITALS — BP 106/79 | HR 83 | Temp 97.7°F | Resp 16 | Ht 65.0 in | Wt 130.0 lb

## 2019-06-27 DIAGNOSIS — Z953 Presence of xenogenic heart valve: Secondary | ICD-10-CM

## 2019-06-27 DIAGNOSIS — L03115 Cellulitis of right lower limb: Secondary | ICD-10-CM

## 2019-06-27 DIAGNOSIS — Z951 Presence of aortocoronary bypass graft: Secondary | ICD-10-CM

## 2019-06-27 DIAGNOSIS — T8149XA Infection following a procedure, other surgical site, initial encounter: Secondary | ICD-10-CM

## 2019-06-27 MED ORDER — CEPHALEXIN 500 MG PO CAPS: 500.0000 mg | ORAL_CAPSULE | Freq: Three times a day (TID) | ORAL | 0 refills | Status: DC

## 2019-06-27 NOTE — Progress Notes (Signed)
HPI:   Patient presents for wound check.  His home health nurse called regarding redness at his [facility] site.  His wife states the leg has been red about 1 week.  It was initially draining but has since stopped.  The area is painful.  He denies fevers, chills.  She had been cleaning area with peroxide and neosporin.   Current Outpatient Medications  Medication Sig Dispense Refill  . amiodarone (PACERONE) 200 MG tablet Take 1 tablet (200 mg total) by mouth 2 (two) times daily after a meal. X 7 days, then decrease to 200 mg daily (Patient taking differently: Take 200 mg by mouth daily. X 7 days, then decrease to 200 mg daily) 60 tablet 1  . aspirin EC 325 MG EC tablet Take 1 tablet (325 mg total) by mouth daily. 30 tablet 0  . atorvastatin (LIPITOR) 80 MG tablet Take 1 tablet (80 mg total) by mouth daily at 6 PM. 30 tablet 3  . dextromethorphan-guaiFENesin (MUCINEX DM) 30-600 MG 12hr tablet Take 1 tablet by mouth 2 (two) times daily as needed for cough.    . levothyroxine (SYNTHROID) 100 MCG tablet Take 100 mcg by mouth daily.    . cephALEXin (KEFLEX) 500 MG capsule Take 1 capsule (500 mg total) by mouth 3 (three) times daily. 21 capsule 0   No current facility-administered medications for this visit.     Physical Exam:  BP 106/79 (BP Location: Right Arm, Patient Position: Sitting, Cuff Size: Normal)   Pulse 83   Temp 97.7 F (36.5 C)   Resp 16   Ht 5\' 5"  (1.651 m)   Wt 130 lb (59 kg)   SpO2 91% Comment: RA  BMI 21.63 kg/m   Gen: no apparent distress Heart: RRR Lungs; CTA bilaterally Wound- Right leg EVH stab incision, is red and fluctuant... eschar present  Diagnostic Tests:  Wound culture   1. Right LE EVH site hematoma that has progressed to mild abscess with cellulitis... eschar removed with immediate evacuation of pus.  Wound culture was obtained.  Clean dressing applied 2. Keflex 500 mg TID x 7 days 3. RTC on Monday for repeat wound check, if develops fevers or wound worsens  over weekend they were instructed to report to the ED  Ellwood Handler, PA-C [facility] [phone]

## 2019-06-27 NOTE — Telephone Encounter (Signed)
Call placed to pt re: appt 06/27/2019 which had to be cancelled due to the message below. I have scheduled pt to see Robbie Lis, PA-C, 07/02/2019 at 2:45. Left a message for pt to call back.

## 2019-06-27 NOTE — Telephone Encounter (Signed)
Received message from [facility] that patient wont be able to make it to appointment, being seen in their office this afternoon for evaluation of EVH wound check - possible cellulitis, may need to be admitted. Please call pt to r/s- of note, he lives in [place] and so long term goal was to get him to establish there since it's closer to home. Please see if they have availability early next week. If not, can get in here in [place]. Kayla Deshaies PA-C

## 2019-06-27 NOTE — Patient Instructions (Signed)
Please keep wound clean and dry.  Change dressing as needed.  Wash wound daily with soap and water daily.   If develops fever or worsening appearance of wound, please report to Emergency Room if its the weekend or contact our office if its M-F 9-5.   Please take all antibiotics

## 2019-06-28 NOTE — Telephone Encounter (Signed)
Pt and wife both have been made aware that Dennis Gomez appt was rescheduled for Tuesday, 07/02/2019 at 2:45 with Robbie Lis, PA-C

## 2019-06-30 LAB — WOUND CULTURE
MICRO NUMBER:: 868014
SPECIMEN QUALITY:: ADEQUATE

## 2019-07-01 ENCOUNTER — Other Ambulatory Visit (INDEPENDENT_AMBULATORY_CARE_PROVIDER_SITE_OTHER): Payer: Medicare Other | Admitting: Physician Assistant

## 2019-07-01 DIAGNOSIS — L03115 Cellulitis of right lower limb: Secondary | ICD-10-CM

## 2019-07-01 DIAGNOSIS — Z953 Presence of xenogenic heart valve: Secondary | ICD-10-CM

## 2019-07-01 DIAGNOSIS — Z951 Presence of aortocoronary bypass graft: Secondary | ICD-10-CM

## 2019-07-01 MED ORDER — CLINDAMYCIN HCL 300 MG PO CAPS: 300.0000 mg | ORAL_CAPSULE | Freq: Three times a day (TID) | ORAL | 0 refills | Status: AC

## 2019-07-01 NOTE — Progress Notes (Signed)
Mr. Obryant' wound culture shows MRSA with Oxacillin resistance.  Will need to stop Keflex.  Will treat with Clindamycin for 10 days.  New RX has been sent to [facility].. Patient contacted to inform of medication change.  Junie Panning Travis Purk PA-C [phone]

## 2019-07-02 ENCOUNTER — Encounter: Payer: Self-pay | Admitting: Physician Assistant

## 2019-07-02 ENCOUNTER — Ambulatory Visit (INDEPENDENT_AMBULATORY_CARE_PROVIDER_SITE_OTHER): Payer: Medicare Other | Admitting: Physician Assistant

## 2019-07-02 ENCOUNTER — Ambulatory Visit (INDEPENDENT_AMBULATORY_CARE_PROVIDER_SITE_OTHER): Payer: Self-pay | Admitting: Physician Assistant

## 2019-07-02 ENCOUNTER — Other Ambulatory Visit: Payer: Self-pay

## 2019-07-02 VITALS — BP 149/82 | HR 62 | Temp 97.7°F | Resp 20 | Ht 65.0 in | Wt 130.0 lb

## 2019-07-02 VITALS — BP 155/84 | HR 63 | Ht 65.0 in | Wt 130.8 lb

## 2019-07-02 DIAGNOSIS — R7989 Other specified abnormal findings of blood chemistry: Secondary | ICD-10-CM | POA: Diagnosis not present

## 2019-07-02 DIAGNOSIS — I5042 Chronic combined systolic (congestive) and diastolic (congestive) heart failure: Secondary | ICD-10-CM

## 2019-07-02 DIAGNOSIS — I9789 Other postprocedural complications and disorders of the circulatory system, not elsewhere classified: Secondary | ICD-10-CM

## 2019-07-02 DIAGNOSIS — R55 Syncope and collapse: Secondary | ICD-10-CM | POA: Diagnosis not present

## 2019-07-02 DIAGNOSIS — Z951 Presence of aortocoronary bypass graft: Secondary | ICD-10-CM

## 2019-07-02 DIAGNOSIS — I25700 Atherosclerosis of coronary artery bypass graft(s), unspecified, with unstable angina pectoris: Secondary | ICD-10-CM | POA: Diagnosis not present

## 2019-07-02 DIAGNOSIS — I951 Orthostatic hypotension: Secondary | ICD-10-CM

## 2019-07-02 DIAGNOSIS — Z953 Presence of xenogenic heart valve: Secondary | ICD-10-CM

## 2019-07-02 DIAGNOSIS — I2510 Atherosclerotic heart disease of native coronary artery without angina pectoris: Secondary | ICD-10-CM

## 2019-07-02 DIAGNOSIS — L03115 Cellulitis of right lower limb: Secondary | ICD-10-CM

## 2019-07-02 DIAGNOSIS — I4891 Unspecified atrial fibrillation: Secondary | ICD-10-CM

## 2019-07-02 NOTE — Progress Notes (Signed)
HPI: Patient returns for a wound check following CABG x 4 on 05/21/19.  He was seen in our office last week after his home health RN notified us that he developed erythema and drainage around his RLE [facility] harvest incision. A layer of eschar was removed and and the wound immediately drained purulent material.  He and his wife were instructed regarding wound care and he was also started on Keflex emprically. The wound drainage was cultured and is positive for MRSA. His antibiotic was changed to clindamycin yesterday when culture results became available and he has had 3 doses so far.  He denies any pain, fever, or chills. He does have some soreness around the 71mm open wound at the site of the stab incision where EVH terminated at his right lower leg.  There is a small 3cm patch of erythema and induration over the tunnel at the mid calf level. This area was not tender.   Current Outpatient Medications  Medication Sig Dispense Refill  . amiodarone (PACERONE) 200 MG tablet Take 1 tablet (200 mg total) by mouth 2 (two) times daily after a meal. X 7 days, then decrease to 200 mg daily (Patient taking differently: Take 200 mg by mouth daily. X 7 days, then decrease to 200 mg daily) 60 tablet 1  . aspirin EC 325 MG EC tablet Take 1 tablet (325 mg total) by mouth daily. 30 tablet 0  . atorvastatin (LIPITOR) 80 MG tablet Take 1 tablet (80 mg total) by mouth daily at 6 PM. 30 tablet 3  . cephALEXin (KEFLEX) 500 MG capsule Take 1 capsule (500 mg total) by mouth 3 (three) times daily. (Patient taking differently: Take 300 mg by mouth 3 (three) times daily. ) 21 capsule 0  . clindamycin (CLEOCIN) 300 MG capsule Take 1 capsule (300 mg total) by mouth 3 (three) times daily for 10 days. 30 capsule 0  . dextromethorphan-guaiFENesin (MUCINEX DM) 30-600 MG 12hr tablet Take 1 tablet by mouth 2 (two) times daily as needed for cough.    . levothyroxine (SYNTHROID) 100 MCG tablet Take 100 mcg by mouth daily.     No current  facility-administered medications for this visit.     Physical Exam: T--97.7 BP--149/82 P--62 RR--20 Heart-RRR Chest- BS coarse. The sternotomy incision is well healed.  EXTs -All are warm and well perfused. He does have some mild tenderness around the 56mm open wound at the site of the stab incision where EVH terminated at his right lower leg.  There is a small 3cm patch of erythema and induration over the tunnel at the mid calf level. This area was not tender. The open wound was gently probed with a Q-tip and there was no obvious undermining or cavity.    Diagnostic Studies:  Status:  Final result Visible to patient:  No (not released) Next appt:  07/08/2019 at 01:30 PM in Cardiothoracic Surgery ([facility]) Dx:  Abscess after procedure Specimen Information: Leg, Right; Abscess     Component 5d ago  MICRO NUMBER: [id number]   SPECIMEN QUALITY: Adequate   SOURCE: WOUND (SITE NOT SPECIFIED)   STATUS: FINAL   GRAM STAIN: No white blood cells seen No epithelial cells seen No organisms seen   ISOLATE 1: methicillin resistant Staphylococcus aureusAbnormal    Comment: Heavy growth of Methicillin resistant Staphylococcus aureus (MRSA) Negative for inducible clindamycin resistance.  Resulting Agency [facility]  Susceptibility   Methicillin resistant staphylococcus aureus    AEROBIC CULT, GRAM STAIN POSITIVE 1  CIPROFLOXACIN >=8  Resistant    CLINDAMYCIN <=0.25  Sensitive    ERYTHROMYCIN >=8  Resistant    GENTAMICIN <=0.5  Sensitive    LEVOFLOXACIN 4  Resistant    OXACILLIN NR  Resistant1    TETRACYCLINE <=1  Sensitive    TRIMETH/SULFA <=10  Sensitive2    VANCOMYCIN 1  Sensitive         1 Oxacillin-resistant staphylococci are resistant to  all currently available beta-lactam antimicrobial  agents including penicillins, beta lactam/beta-  lactamase inhibitor combinations, and cephems with  staphylococcal indications, including Cefazolin.  2 Legend:  S = Susceptible I =  Intermediate  R = Resistant NS = Not susceptible  * = Not tested NR = Not reported  **NN = See antimicrobic comments        Specimen Collected: 06/27/19 14:30 Last Resulted: 06/30/19 07:37          Impression / Plan: EVH tunnel abscess ~6 wks post CABG that is culture positive for MRSA. He has some mild erythema over the tunnel above the 19mm open wound but no significant drainage could be expressed today. His antibiotic was just changed to clindamycin yesterday to cover the MRSA cultured from the wound at the last office visit.  I think it is worthwhile to give this more time on appropriate oral antibiotic therapy before pursuing any other intervention. I explained to Mr. Kidane and his wife that if the erythema and open wound persist, surgical incision and drainage and possible wound vac therapy may be needed. He will follow up in the office next week or sooner if he develops more pain, drainage, or erythema.   Leary Roca, PA-C [facility] [phone]

## 2019-07-02 NOTE — Patient Instructions (Signed)
Continue wound care and dressing changes as instructed.  Continue Clindamycin 3 times daily as prescribed.  Follow up in the office in 1 week or sooner if you develop fever, more pain, or increased drainage from the wound.

## 2019-07-02 NOTE — Progress Notes (Signed)
Cardiology Office Note    Date:  07/02/2019   ID:  Dennis Gomez, DOB [birth year], MRN [id number]  PCP:  Serita Grammes, MD  Cardiologist:  Dr. Angelena Form (will be followed in [place])  Chief Complaint: 2 weeks follow up  History of Present Illness:   Dennis Gomez is a 68 y.o. male with COPD, tobacco abuse (quit), hypothroidism and recently diagnosed HTN, acute CHF/LV dysfunction (EF 35-40%), moderate-severe aortic regurgitation s/p pericardial AVR, CAD s/p CABG presents for follow up.   July 2020 he was transferred from [facility] to [facility] with chest pressure, leg swelling, diaphoresis, and mildly elevated troponin. His echo at [facility] was reported to have shown a severely dilated left ventricle with moderate concentric LVH, EF 35 to 40%, moderate-severe aortic insufficiency. His echocardiogram on 05/17/19 at [facility] was read as EF 45-50%, mild LVH, diffuse HK, normal RV, mild LAE/RAE, moderate-severe AI, mild dilation of aortic root and ascending aorta. He underwent cardiac cath showing severe multivessel disease with moderately severe AI and normal right heart hemodynamics with low right-sided filling pressures. Coronary CT showed bicuspid aortic valve with mild ascending aortic root enlargement. He underwent bovine pericardial AVR and CABGx4 on 05/21/19. Intra-op TEE reported to show normalized EF, interpreted by anesthesia. He did have episodes of post-op AF treated with amiodarone, which he was discharged on. He was not felt to require anticoagulation. Post-op course also notable for anemia/thrombocytopenia and low co-ox requiring milrinone.  After discharge patient has 2 syncope spells.  Losartan and spironolactone were discontinued due to soft blood pressure. CT angio without PE. 2D echo 06/07/19 showed EF 25-30%, appearing worse since surgery, with reasonable post AVR gradients. However, Dr. Angelena Form reviewed as "his LVEF was around 35% on the preop echo. Suspected  that  it is overall probably not that much different. Monitor result pending.   Last seen by Melina Copa 06/17/2019. Felt syncope episode due to hypotension. He noted orthostatic hypotensive during nurse visit 06/18/2019. Advised to keep hydrated. Noted elevated TSH with normal free T4 >> advised to follow up with PCP.   He was diagnosed cellulitis with mild abscess to right LE EVH site. Treated with ketflex however would culture grew 9/14  MRSA with Oxacillin resistance.  Started clindamycin.   Here today for follow up.  No recurrent syncope.  Wearing monitor, last day is Friday.  His dizziness has been resolved.  He denies chest pain, shortness of breath, orthopnea, PND or melena.  He has right lower extremity swelling and erythema secondary to cellulitis.  No edema on her left lower leg.   Past Medical History:  Diagnosis Date  . Aortic regurgitation   . Chronic combined systolic and diastolic CHF (congestive heart failure) ([place])    a. LVEF 35-40% at [place] in 04/2019, 45-50% at [facility] pre-AVR. 25-30% by repeat echo 05/2019.  [name] COPD (chronic obstructive pulmonary disease) ([place])   . Coronary artery disease    a. s/p CABGx4 in 05/2019.  [name] Essential hypertension   . Hypothyroid   . PAD (peripheral artery disease) ([facility])    a. R/L ABIs indicated mild lower extremity arterial disease and carotid duplex showed 1-39% stenosis.  [name] Postoperative atrial fibrillation ([place])   . S/P aortic valve replacement with bioprosthetic valve 05/21/2019   25 mm Edwards Inspiris Resilia stented bovine pericardial tissue valve  . 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  Tobacco abuse     Past Surgical History:  Procedure Laterality Date  . AORTIC VALVE REPLACEMENT N/A 05/21/2019   Procedure: AORTIC VALVE REPLACEMENT (AVR) using Inspiris Valve size 25mm;  Surgeon: Purcell Nailswen, [name], MD;  [facility];  Service: Open Heart Surgery;  Laterality: N/A;  .  CORONARY ARTERY BYPASS GRAFT N/A 05/21/2019   Procedure: CORONARY ARTERY BYPASS GRAFTING (CABG) x Four , using left internal mammary artery and right leg greater saphenous vein harvested endoscopically;  Surgeon: Purcell Nailswen, [name], MD;  [facility];  Service: Open Heart Surgery;  Laterality: N/A;  . RIGHT/LEFT HEART CATH AND CORONARY ANGIOGRAPHY N/A 05/17/2019   Procedure: RIGHT/LEFT HEART CATH AND CORONARY ANGIOGRAPHY;  Surgeon: Tonny Bollmanooper, Michael, MD;  [facility];  Service: Cardiovascular;  Laterality: N/A;  . TEE WITHOUT CARDIOVERSION N/A 05/21/2019   Procedure: TRANSESOPHAGEAL ECHOCARDIOGRAM (TEE);  Surgeon: Purcell Nailswen, [name], MD;  [facility];  Service: Open Heart Surgery;  Laterality: N/A;    Current Medications: Prior to Admission medications   Medication Sig Start Date End Date Taking? Authorizing Provider  amiodarone (PACERONE) 200 MG tablet Take 1 tablet (200 mg total) by mouth 2 (two) times daily after a meal. X 7 days, then decrease to 200 mg daily Patient taking differently: Take 200 mg by mouth daily. X 7 days, then decrease to 200 mg daily 05/27/19   Barrett, Rae RoamErin R, PA-C  aspirin EC 325 MG EC tablet Take 1 tablet (325 mg total) by mouth daily. 05/27/19   Barrett, Erin R, PA-C  atorvastatin (LIPITOR) 80 MG tablet Take 1 tablet (80 mg total) by mouth daily at 6 PM. 05/27/19   Barrett, Erin R, PA-C  cephALEXin (KEFLEX) 500 MG capsule Take 1 capsule (500 mg total) by mouth 3 (three) times daily. 06/27/19   Barrett, Erin R, PA-C  clindamycin (CLEOCIN) 300 MG capsule Take 1 capsule (300 mg total) by mouth 3 (three) times daily for 10 days. 07/01/19 07/11/19  Barrett, Rae RoamErin R, PA-C  dextromethorphan-guaiFENesin (MUCINEX DM) 30-600 MG 12hr tablet Take 1 tablet by mouth 2 (two) times daily as needed for cough. 06/17/19   Ardelle BallsZimmerman, Donielle M, PA-C  levothyroxine (SYNTHROID) 100 MCG tablet Take 100 mcg by mouth daily. 05/09/19   [provider]    Allergies:   Hydrocodone    Social History   Socioeconomic History  . Marital status: Married    Spouse name: Not on file  . Number of children: Not on file  . Years of education: Not on file  . Highest education level: Not on file  Occupational History  . Not on file  Social Needs  . Financial resource strain: Not on file  . Food insecurity    Worry: Not on file    Inability: Not on file  . Transportation needs    Medical: Not on file    Non-medical: Not on file  Tobacco Use  . Smoking status: Current Every Day Smoker    Types: Cigarettes  . Smokeless tobacco: Never Used  Substance and Sexual Activity  . Alcohol use: Not Currently  . Drug use: Not on file  . Sexual activity: Not on file  Lifestyle  . Physical activity    Days per week: Not on file    Minutes per session: Not on file  . Stress: Not on file  Relationships  . Social Musicianconnections    Talks on phone: Not on file    Gets together: Not on file  Attends religious service: Not on file    Active member of club or organization: Not on file    Attends meetings of clubs or organizations: Not on file    Relationship status: Not on file  Other Topics Concern  . Not on file  Social History Narrative  . Not on file     Family History:  The patient's family history includes COPD in his father; Cancer in his mother.  ROS:   Please see the history of present illness.    ROS All other systems reviewed and are negative.   PHYSICAL EXAM:   VS:  BP (!) 155/84   Pulse 63   Ht 5\' 5"  (1.651 m)   Wt 130 lb 12.8 oz (59.3 kg)   BMI 21.77 kg/m    GEN: Well nourished, well developed, in no acute distress  HEENT: normal  Neck: no JVD, carotid bruits, or masses Cardiac: RRR; no murmurs, rubs, or gallops Respiratory: Faint Rales bibasilar GI: soft, nontender, nondistended, + BS MS: no deformity or atrophy  Skin/extremities: Right lower leg has erythema with edema.  Dressing applied to open wound  neuro:  Alert and Oriented x 3, Strength and  sensation are intact Psych: euthymic mood, full affect  Wt Readings from Last 3 Encounters:  07/02/19 130 lb 12.8 oz (59.3 kg)  07/02/19 130 lb (59 kg)  06/27/19 130 lb (59 kg)      Studies/Labs Reviewed:   EKG:  EKG is not ordered today.    Recent Labs: 05/18/2019: ALT 11 06/18/2019: BUN 17; Creatinine, Ser 0.95; Hemoglobin 10.4; Magnesium 2.1; Platelets 264; Sodium 140; TSH 15.100 06/19/2019: Potassium 4.2   Lipid Panel No results found for: CHOL, TRIG, HDL, CHOLHDL, VLDL, LDLCALC, LDLDIRECT  Additional studies/ records that were reviewed today include:   Echocardiogram: 06/07/2019   1. The left ventricle has severely reduced systolic function, with an ejection fraction of 25-30%. The cavity size was severely dilated. Left ventricular diastolic parameters were normal.  2. LV function appears markedly worse since surgery.  3. The right ventricle has normal systolic function. The cavity was normal. There is no increase in right ventricular wall thickness.  4. Moderate thickening of the mitral valve leaflet. Mild calcification of the mitral valve leaflet. There is moderate mitral annular calcification present.  5. Post AVR with 25 mm Bovine edwards Inspiris valve no PVL and reasonable gradients with normal DVI 0.42.  6. The aorta is normal unless otherwise noted.   Cardiac Catheterization:  05/17/2019 Conclusion  1) Severe multivessel CAD with severe stenosis of the LAD, diagonal, OM, and distal circumflex (dominant circumflex) 2) Moderately severe AI (3-4+) 3) Normal right heart hemodynamics with low right sided filling pressures  Recommend: Surgical consultation for consideration of AVR/CABG       ASSESSMENT & PLAN:    1. CAD s/p CABG and pericardial AVR -No angina.  Continue aspirin 325 mg daily and Lipitor 80 mg daily.  2. Chronic combined CHF - Dr. [name] reviewed echo and did not feel it was that far different from pre-echocardiogram. - Advanced heart failure  regimen limited by to hypotension -He appears euvolemic by exam.  Mild crackles on exam with symptoms due to atelectasis.  He denies orthopnea or PND.  Sleeping flat on bed. -Consider repeat echocardiogram in 3 months.   3. Orthostatic hypotension -Still orthostatic but improved from prior.  Did not felt any dizziness.  Reviewed with DOD Dr. Anne FuSkains  We will continue to hold adding heart failure regimen  or diuretics.   Orthostatic VS for the past 24 hrs:  BP- Lying Pulse- Lying BP- Sitting Pulse- Sitting BP- Standing at 0 minutes Pulse- Standing at 0 minutes  07/02/19 1459 152/85 60 145/87 65 133/83 71    4. RLE cellulitis - On clindamycin now.  Has repeat check with surgery next week.  5. Abnormal TSH - Last TSH check 15.1.  Encouraged to follow-up with PCP.  Wife will schedule appointment. -Currently on Synthroid 100 mcg daily.  6. Post op afib -Denies palpitation.  Continue amiodarone 200 mg daily.  Wearing monitor.  7. Syncope - Felt due to hypotension. Pending monitor result   Medication Adjustments/Labs and Tests Ordered: Current medicines are reviewed at length with the patient today.  Concerns regarding medicines are outlined above.  Medication changes, Labs and Tests ordered today are listed in the Patient Instructions below. Patient Instructions  Medication Instructions:  Your physician recommends that you continue on your current medications as directed. Please refer to the Current Medication list given to you today.  If you need a refill on your cardiac medications before your next appointment, please call your pharmacy.   Lab work: None ordered If you have labs (blood work) drawn today and your tests are completely normal, you will receive your results only by: [name] MyChart Message (if you have MyChart) OR . A paper copy in the mail If you have any lab test that is abnormal or we need to change your treatment, we will call you to review the results.   Testing/Procedures: None ordered  Follow-Up: At [facility], you and your health needs are our priority.  As part of our continuing mission to provide you with exceptional heart care, we have created designated Provider Care Teams.  These Care Teams include your primary Cardiologist (physician) and Advanced Practice Providers (APPs -  Physician Assistants and Nurse Practitioners) who all work together to provide you with the care you need, when you need it. You will need a follow up appointment in 3-4 weeks with Dr. Tawanna Cooler / 1st available in the [place] office, if nothing open, then he will come back to [place].   Any Other Special Instructions Will Be Listed Below (If Applicable).       Lorelei Pont, [place]  07/02/2019 3:46 PM    [facility] [address] [place], [place], [place]  [address] Phone: [phone]; Fax: [phone]

## 2019-07-02 NOTE — Patient Instructions (Addendum)
Medication Instructions:  Your physician recommends that you continue on your current medications as directed. Please refer to the Current Medication list given to you today.  If you need a refill on your cardiac medications before your next appointment, please call your pharmacy.   Lab work: None ordered If you have labs (blood work) drawn today and your tests are completely normal, you will receive your results only by: [name] MyChart Message (if you have MyChart) OR . A paper copy in the mail If you have any lab test that is abnormal or we need to change your treatment, we will call you to review the results.  Testing/Procedures: None ordered  Follow-Up: At [facility], you and your health needs are our priority.  As part of our continuing mission to provide you with exceptional heart care, we have created designated Provider Care Teams.  These Care Teams include your primary Cardiologist (physician) and Advanced Practice Providers (APPs -  Physician Assistants and Nurse Practitioners) who all work together to provide you with the care you need, when you need it. You will need a follow up appointment in 3-4 weeks with Dr. Sherren Mocha / 1st available in the [place] office, if nothing open, then he will come back to [place].   Any Other Special Instructions Will Be Listed Below (If Applicable).

## 2019-07-08 ENCOUNTER — Encounter: Payer: Self-pay | Admitting: Physician Assistant

## 2019-07-08 ENCOUNTER — Ambulatory Visit (INDEPENDENT_AMBULATORY_CARE_PROVIDER_SITE_OTHER): Payer: Self-pay | Admitting: Physician Assistant

## 2019-07-08 ENCOUNTER — Other Ambulatory Visit: Payer: Self-pay

## 2019-07-08 VITALS — BP 132/81 | HR 68 | Temp 97.7°F | Resp 18 | Ht 65.0 in | Wt 129.0 lb

## 2019-07-08 DIAGNOSIS — T8149XA Infection following a procedure, other surgical site, initial encounter: Secondary | ICD-10-CM

## 2019-07-08 NOTE — Patient Instructions (Signed)
Complete clindamycin as prescribed.  Leave wound open to air as ling as it remains dry.

## 2019-07-08 NOTE — Progress Notes (Signed)
  HPI: HPI: Patient returns for a wound check following CABG x 4 on 05/21/19.  He was seen in our office weekl;y for the past 2 weeks after his home health RN notified us that he developed erythema and drainage around his RLE [facility] harvest incision. A layer of eschar was removed in the office 2 weeks ago and the wound immediately drained purulent material.  He and his wife were instructed regarding wound care and he was also started on Keflex emprically. The wound drainage was cultured and is positive for MRSA. His antibiotic was changed to clindamycin just before his previous visit last week and he had taken only 3 doses by the time of his visit.   He denies any leg pain, fever, or chills. He said the wound was without drainage for several days then opened and drained during the night 2 days ago. He has enough clindamycin for 2 1/2 more days.   Current Outpatient Medications  Medication Sig Dispense Refill  . amiodarone (PACERONE) 200 MG tablet Take 1 tablet (200 mg total) by mouth 2 (two) times daily after a meal. X 7 days, then decrease to 200 mg daily (Patient taking differently: Take 200 mg by mouth daily. X 7 days, then decrease to 200 mg daily) 60 tablet 1  . aspirin EC 325 MG EC tablet Take 1 tablet (325 mg total) by mouth daily. 30 tablet 0  . atorvastatin (LIPITOR) 80 MG tablet Take 1 tablet (80 mg total) by mouth daily at 6 PM. 30 tablet 3  . cephALEXin (KEFLEX) 500 MG capsule Take 1 capsule (500 mg total) by mouth 3 (three) times daily. (Patient taking differently: Take 300 mg by mouth 3 (three) times daily. ) 21 capsule 0  . clindamycin (CLEOCIN) 300 MG capsule Take 1 capsule (300 mg total) by mouth 3 (three) times daily for 10 days. 30 capsule 0  . dextromethorphan-guaiFENesin (MUCINEX DM) 30-600 MG 12hr tablet Take 1 tablet by mouth 2 (two) times daily as needed for cough.    . levothyroxine (SYNTHROID) 100 MCG tablet Take 100 mcg by mouth daily.     No current facility-administered  medications for this visit.     Physical Exam:  Physical Exam: T--97.7 BP--132/81 P--68 RR--18  Heart-RRR Chest- BS coarse. The sternotomy incision is well healed.  EXTs -All are warm and well perfused. The erythema previously seen over his medial right lower leg has resolved. The stab wound at the termination of the [facility] harvest has a thin eschar.  There is minimal induration along the lower leg EVH tunnel that has also improved. There is no drainage and no fluctuance.      Impression / Plan:  Resolving RLE  Wound infection following EVH for CABG.  I asked him to complete the clindamycin as prescribed.  He may leave the wound open to air as long as it remains dry. I asked him to follow up in the office in 1 week to allow [place] to assess the wound after being off ABS for several days.   Antony Odea, PA-C [facility] [phone]

## 2019-07-15 ENCOUNTER — Other Ambulatory Visit: Payer: Self-pay

## 2019-07-15 ENCOUNTER — Ambulatory Visit (INDEPENDENT_AMBULATORY_CARE_PROVIDER_SITE_OTHER): Payer: Self-pay | Admitting: Physician Assistant

## 2019-07-15 VITALS — BP 147/79 | HR 68 | Temp 97.8°F | Resp 20 | Ht 65.0 in | Wt 132.0 lb

## 2019-07-15 DIAGNOSIS — Z951 Presence of aortocoronary bypass graft: Secondary | ICD-10-CM

## 2019-07-15 DIAGNOSIS — T8149XA Infection following a procedure, other surgical site, initial encounter: Secondary | ICD-10-CM

## 2019-07-15 DIAGNOSIS — L03115 Cellulitis of right lower limb: Secondary | ICD-10-CM

## 2019-07-15 DIAGNOSIS — Z953 Presence of xenogenic heart valve: Secondary | ICD-10-CM

## 2019-07-15 NOTE — Progress Notes (Signed)
  HPI: Patient returns fora wound check following CABG x 4 on 05/21/19. He was seen in our office weekly for the past 3 weeks after his home health RN notified us that he developed erythema and drainage around his RLE [facility] harvest incision.A layer of eschar was removed in the office 3 weeks ago and the wound immediately drained purulent material. He and his wife were instructed regarding wound care and he was also started on Keflex emprically. The wound drainage was cultured and is positive for MRSA. He has completed a course of oral clindamycin.  He denies any leg pain, fever, or chills. He said the wound was without drainage for over 10 days.    Current Outpatient Medications  Medication Sig Dispense Refill  . amiodarone (PACERONE) 200 MG tablet Take 1 tablet (200 mg total) by mouth 2 (two) times daily after a meal. X 7 days, then decrease to 200 mg daily (Patient taking differently: Take 200 mg by mouth daily. X 7 days, then decrease to 200 mg daily) 60 tablet 1  . aspirin EC 325 MG EC tablet Take 1 tablet (325 mg total) by mouth daily. 30 tablet 0  . atorvastatin (LIPITOR) 80 MG tablet Take 1 tablet (80 mg total) by mouth daily at 6 PM. 30 tablet 3  . cephALEXin (KEFLEX) 500 MG capsule Take 1 capsule (500 mg total) by mouth 3 (three) times daily. (Patient taking differently: Take 300 mg by mouth 3 (three) times daily. ) 21 capsule 0  . dextromethorphan-guaiFENesin (MUCINEX DM) 30-600 MG 12hr tablet Take 1 tablet by mouth 2 (two) times daily as needed for cough.    . levothyroxine (SYNTHROID) 100 MCG tablet Take 100 mcg by mouth daily.     No current facility-administered medications for this visit.     Physical Exam: VS:   T  97.8 F BP  147/79 RR  20 HR  68 SaO2  90%  Heart: remains in  RRR Chest: BS clear.  The sternum is stable and the chest incision is well healed.  Ext's-  The right erythema and induration along the lower EVH tunnel  have completely resolved. The small stab wound  that previously opened and drained has healed.    Diagnostic Tests: none  Impression / Plan:  Overall he continues to make satisfactory progress following CABG about 8 wks ago. The RLE wound infection that was culture-positive for MRSA has resolved.  He appears to be maintaining SR on amiodarone 200mg  po daily.  This could be discontinued from our standpoint if cardiology agrees.  No further follow up is scheduled but I let he and his wife know we would be glad to assist them further should any other needs arise.     Antony Odea, PA-C [facility] [phone]

## 2019-07-15 NOTE — Patient Instructions (Signed)
May resume driving.  No need for any more antibiotics at this time.  Keep the leg wound clean, dry and open to air.  Follow up as needed.

## 2019-07-19 ENCOUNTER — Ambulatory Visit: Payer: Medicare Other | Admitting: Physician Assistant

## 2019-07-22 ENCOUNTER — Encounter: Payer: Self-pay | Admitting: Thoracic Surgery (Cardiothoracic Vascular Surgery)

## 2019-07-22 ENCOUNTER — Other Ambulatory Visit: Payer: Self-pay

## 2019-07-22 ENCOUNTER — Ambulatory Visit (INDEPENDENT_AMBULATORY_CARE_PROVIDER_SITE_OTHER): Payer: Self-pay | Admitting: Thoracic Surgery (Cardiothoracic Vascular Surgery)

## 2019-07-22 VITALS — BP 168/85 | HR 76 | Temp 97.5°F | Resp 16 | Ht 65.0 in | Wt 133.4 lb

## 2019-07-22 DIAGNOSIS — Z953 Presence of xenogenic heart valve: Secondary | ICD-10-CM

## 2019-07-22 DIAGNOSIS — Z951 Presence of aortocoronary bypass graft: Secondary | ICD-10-CM

## 2019-07-22 MED ORDER — METOPROLOL TARTRATE 12.5 MG HALF TABLET: 12.5000 mg | ORAL_TABLET | Freq: Two times a day (BID) | ORAL | Status: DC

## 2019-07-22 MED ORDER — LOSARTAN POTASSIUM 25 MG PO TABS: 25.0000 mg | ORAL_TABLET | Freq: Every day | ORAL | 5 refills | Status: DC

## 2019-07-22 NOTE — Patient Instructions (Addendum)
Stop taking amiodarone  Resume taking losartan (Cozaar) 25 mg by mouth daily  You may resume unrestricted physical activity without any particular limitations at this time.  Check your blood pressure on a regular basis and keep a log for your records.  Discussed with your cardiologist and/or primary care physician whether or not your blood pressure medications should be adjusted  Endocarditis is a potentially serious infection of heart valves or inside lining of the heart.  It occurs more commonly in patients with diseased heart valves (such as patient's with aortic or mitral valve disease) and in patients who have undergone heart valve repair or replacement.  Certain surgical and dental procedures may put you at risk, such as dental cleaning, other dental procedures, or any surgery involving the respiratory, urinary, gastrointestinal tract, gallbladder or prostate gland.   To minimize your chances for develooping endocarditis, maintain good oral health and seek prompt medical attention for any infections involving the mouth, teeth, gums, skin or urinary tract.    Always notify your doctor or dentist about your underlying heart valve condition before having any invasive procedures. You will need to take antibiotics before certain procedures, including all routine dental cleanings or other dental procedures.  Your cardiologist or dentist should prescribe these antibiotics for you to be taken ahead of time.

## 2019-07-22 NOTE — Progress Notes (Signed)
Dennis Gomez       ,Dennis Gomez             [phone]     CARDIOTHORACIC SURGERY OFFICE NOTE  Referring Provider is Dennis Mocha, MD Primary Cardiologist is Dennis Salines, DO (new) PCP is Dennis Grammes, MD   HPI:  Patient is a 68 year old male who returns the office today for routine follow-up status post aortic valve replacement using a bioprosthetic tissue valve and coronary artery bypass grafting x4 on May 21, 2019 for bicuspid aortic valve with severe aortic insufficiency and severe three-vessel coronary artery disease with unstable angina pectoris.  The patient's early postoperative recovery in the hospital was notable for postoperative atrial fibrillation for which the patient was treated with amiodarone.  He otherwise did well and was discharged home in sinus rhythm on the sixth postoperative day.  Shortly after hospital discharge the patient began to have problems with dizziness and suffered a frank syncopal event.  He reportedly was seen in the emergency department at [facility] in [place], but our office was never contacted.  The second time he apparently suffered a frank syncopal episode and fell and hit his head, requiring multiple stitches to suture up lacerations.  By report EKG revealed sinus rhythm and blood pressure was soft at 100/38.  Spironolactone and losartan were discontinued at that time.  Questions regarding the possibility of reaction to hydrocodone were considered.  All pain medications were stopped.  Patient was seen in follow-up by Dennis Gomez at [facility] on June 05, 2019.  Follow-up transthoracic echocardiogram and 30-day event monitor were arranged.  Follow-up thyroid function tests were ordered.  Echocardiogram performed June 07, 2019 revealed severe left ventricular systolic dysfunction with normal functioning bioprosthetic tissue valve in the aortic position.  The patient was seen in follow-up in our office on  June 17, 2019 at which time he was doing well.  Over the next 2 weeks he developed redness from his right leg incision related to endoscopic vein harvest, ultimately developing a small amount of purulent drainage.  He was initially prescribed Keflex although wound cultures subsequently grew MRSA.  This was changed to clindamycin.  The patient returns to our office today for further follow-up and wound check.  He is no longer on any antibiotics.  He is clinically doing quite well.  He has not had any dizzy spells.  His surgical incisions have healed nicely, including the right lower extremity incision.  Overall he is pleased with his progress.  He specifically denies any pain in his chest.  He has no shortness of breath.  Overall he states that he feels "much improved" in comparison with how he felt prior to surgery.   Current Outpatient Medications  Medication Sig Dispense Refill   amiodarone (PACERONE) 200 MG tablet Take 1 tablet (200 mg total) by mouth 2 (two) times daily after a meal. X 7 days, then decrease to 200 mg daily (Patient taking differently: Take 200 mg by mouth daily. X 7 days, then decrease to 200 mg daily) 60 tablet 1   aspirin EC 325 MG EC tablet Take 1 tablet (325 mg total) by mouth daily. 30 tablet 0   atorvastatin (LIPITOR) 80 MG tablet Take 1 tablet (80 mg total) by mouth daily at 6 PM. 30 tablet 3   dextromethorphan-guaiFENesin (MUCINEX DM) 30-600 MG 12hr tablet Take 1 tablet by mouth 2 (two) times daily as needed for cough.     levothyroxine (  SYNTHROID) 112 MCG tablet TAKE 1 TABLET DAILY IN THE MORNING ON AN EMPTY STOMACH FOR HYPOTHYROIDISM     cephALEXin (KEFLEX) 500 MG capsule Take 1 capsule (500 mg total) by mouth 3 (three) times daily. (Patient not taking: Reported on 07/15/2019) 21 capsule 0   No current facility-administered medications for this visit.       Physical Exam:   BP (!) 168/85    Pulse 76    Temp (!) 97.5 F (36.4 C) (Skin)    Resp 16    Ht 5\' 5"   (1.651 m)    Wt 133 lb 6.4 oz (60.5 kg)    SpO2 93% Comment: RA   BMI 22.20 kg/m   General:  Well-appearing  Chest:   Clear to auscultation  CV:   Regular rate and rhythm without murmur  Incisions:  Healing nicely, sternum is stable  Abdomen:  Soft nontender  Extremities:  Warm and well-perfused, no edema  Diagnostic Tests:   ECHOCARDIOGRAM REPORT       Patient Name:   Dennis Gomez Date of Exam: 06/07/2019 Medical Rec #:  06/09/2019       Height:       65.0 in Accession #:    [id number]      Weight:       130.8 lb Date of Birth:  1951/06/02        BSA:          1.65 m Patient Age:    67 years        BP:           128/74 mmHg Patient Gender: M               HR:           65 bpm. Exam Location:  [address]    Procedure: 2D Echo, Cardiac Doppler and Color Doppler  Indications:    R55 Syncope   History:        Patient has prior history of Echocardiogram examinations, most                 recent 05/17/2019. Prior CABG and Status post Aortic valve                 replacement-25 mm Edwards inspiris Resilia stented Bovine                 Pericardial tissue valve Risk Factors: Current Smoker. Coronary                 artery disease. Hypothyroidism. Post-operative atrial                 fibrillation. COPD. Combined systolic and diastolic heart                 failure.   Sonographer:    05/19/2019 Dennis Gomez RDCS Referring Phys: [id number] Dennis Gomez  IMPRESSIONS    1. The left ventricle has severely reduced systolic function, with an ejection fraction of 25-30%. The cavity size was severely dilated. Left ventricular diastolic parameters were normal.  2. LV function appears markedly worse since surgery.  3. The right ventricle has normal systolic function. The cavity was normal. There is no increase in right ventricular wall thickness.  4. Moderate thickening of the mitral valve leaflet. Mild calcification of the mitral valve leaflet. There is moderate mitral annular  calcification present.  5. Post AVR with 25 mm Bovine edwards Inspiris valve no PVL and reasonable gradients with  normal DVI 0.42.  6. The aorta is normal unless otherwise noted.  FINDINGS  Left Ventricle: The left ventricle has severely reduced systolic function, with an ejection fraction of 25-30%. The cavity size was severely dilated. There is no increase in left ventricular wall thickness. Left ventricular diastolic parameters were  normal. LV function appears markedly worse since surgery.  Right Ventricle: The right ventricle has normal systolic function. The cavity was normal. There is no increase in right ventricular wall thickness.  Left Atrium: Left atrial size was normal in size.  Right Atrium: Right atrial size was normal in size. Right atrial pressure is estimated at 10 mmHg.  Interatrial Septum: No atrial level shunt detected by color flow Doppler.  Pericardium: There is no evidence of pericardial effusion.  Mitral Valve: The mitral valve is normal in structure. Moderate thickening of the mitral valve leaflet. Mild calcification of the mitral valve leaflet. There is moderate mitral annular calcification present. Mitral valve regurgitation is mild by color  flow Doppler.  Tricuspid Valve: The tricuspid valve is normal in structure. Tricuspid valve regurgitation is mild by color flow Doppler.  Aortic Valve: The aortic valve has been repaired/replaced Aortic valve regurgitation was not visualized by color flow Doppler. Post AVR with 25 mm Bovine edwards Inspiris valve no PVL and reasonable gradients with normal DVI 0.42.  Pulmonic Valve: The pulmonic valve was grossly normal. Pulmonic valve regurgitation is mild by color flow Doppler.  Aorta: The aorta is normal unless otherwise noted.    +--------------+-------++  LEFT VENTRICLE           +----------------+---------++ +--------------+-------++  Diastology                    PLAX 2D                   +----------------+---------++ +--------------+-------++  LV e' lateral:   6.74 cm/s    LVIDd:         5.30 cm   +----------------+---------++ +--------------+-------++  LV E/e' lateral: 14.2         LVIDs:         4.20 cm   +----------------+---------++ +--------------+-------++  LV e' medial:    6.42 cm/s    LV PW:         1.40 cm   +----------------+---------++ +--------------+-------++  LV E/e' medial:  14.9         LV IVS:        0.90 cm   +----------------+---------++ +--------------+-------++  LV SV:         57 ml     +--------------+-------++  LV SV Index:   34.38     +--------------+-------++                           +--------------+-------++  +---------------+---------++  RIGHT VENTRICLE             +---------------+---------++  RV Basal diam:  4.20 cm     +---------------+---------++  RV S prime:     8.54 cm/s   +---------------+---------++  TAPSE (M-mode): 1.2 cm      +---------------+---------++  +---------------+-------++-----------++  LEFT ATRIUM              Index         +---------------+-------++-----------++  LA diam:        3.60 cm  2.18 cm/m    +---------------+-------++-----------++  LA Vol (A2C):   66.2 ml  40.09 ml/m   +---------------+-------++-----------++  LA Vol (A4C):   37.1 ml  22.46 ml/m   +---------------+-------++-----------++  LA Biplane Vol: 50.8 ml  30.76 ml/m   +---------------+-------++-----------++ +------------+---------++-----------++  RIGHT ATRIUM            Index         +------------+---------++-----------++  RA Area:     15.60 cm                +------------+---------++-----------++  RA Volume:   48.60 ml   29.43 ml/m   +------------+---------++-----------++  +------------------+------------++  AORTIC VALVE                      +------------------+------------++  AV Vmax:           190.60 cm/s    +------------------+------------++  AV Vmean:          129.000 cm/s   +------------------+------------++  AV VTI:             0.301 m        +------------------+------------++  AV Peak Grad:      14.5 mmHg      +------------------+------------++  AV Mean Grad:      7.8 mmHg       +------------------+------------++  LVOT Vmax:         82.10 cm/s     +------------------+------------++  LVOT Vmean:        53.000 cm/s    +------------------+------------++  LVOT VTI:          0.126 m        +------------------+------------++  LVOT/AV VTI ratio: 0.42           +------------------+------------++   +-------------+-------++  AORTA                   +-------------+-------++  Ao Root diam: 3.70 cm   +-------------+-------++  Ao Asc diam:  3.70 cm   +-------------+-------++  +--------------+----------++ +---------------+-----------++  MITRAL VALVE                 TRICUSPID VALVE               +--------------+----------++ +---------------+-----------++  MV Area (PHT): 3.31 cm      TR Peak grad:   21.9 mmHg     +--------------+----------++ +---------------+-----------++  MV PHT:        66.41 msec    TR Vmax:        255.00 cm/s   +--------------+----------++ +---------------+-----------++  MV Decel Time: 229 msec     +--------------+----------++ +-------------+------+ +--------------+----------++  SHUNTS                 MV E velocity: 95.70 cm/s   +-------------+------+ +--------------+----------++  Systemic VTI: 0.13 m   MV A velocity: 77.70 cm/s   +-------------+------+ +--------------+----------++  MV E/A ratio:  1.23         +--------------+----------++    Charlton HawsPeter Nishan MD Electronically signed by Charlton HawsPeter Nishan MD Signature Date/Time: 06/07/2019/5:20:09 PM    Impression:  Patient is now doing quite well approximately 2 months following aortic valve replacement and coronary artery bypass grafting.  The minor wound infection involving his right lower extremity endoscopic vein harvest incision has resolved completely.  Blood pressure is somewhat elevated in the office today.  There are no signs of  volume overload.    Plan:  I have instructed the patient to restart taking losartan 25 mg by mouth daily.  I have also instructed the patient to stop taking amiodarone.  I have  encouraged the patient to continue to gradually increase his physical activity as tolerated.  I do not think he should resume driving an automobile until results of his recent 30-day event monitor have been reviewed.  All of his questions have been addressed.  The patient will return to our office for routine follow-up next summer, approximately 1 year following his surgery.  He will call and return sooner should specific problems or questions arise.      Salvatore Decent. Cornelius Moras, MD 07/22/2019 12:18 PM

## 2019-07-26 ENCOUNTER — Telehealth: Payer: Self-pay

## 2019-07-26 NOTE — Telephone Encounter (Signed)
Pt is returning a phone call.

## 2019-07-26 NOTE — Telephone Encounter (Signed)
lmtcb regarding event monitor results

## 2019-07-26 NOTE — Progress Notes (Unsigned)
Dennis Blanks, MD  Rodman Key, RN        Can we let him know that there is no atrial fibrillation on his monitor? He can stop the amiodarone. Thanks, chris     LMTCB regarding monitor results

## 2019-07-26 NOTE — Telephone Encounter (Signed)
-----   Message from Burnell Blanks, MD sent at 07/26/2019 10:23 AM EDT ----- Can we let him know that there is no atrial fibrillation on his monitor? He can stop the amiodarone. Thanks, chris

## 2019-07-26 NOTE — Telephone Encounter (Signed)
Dr. Ricard Dillon advised pt to stop amiodarone last Monday Oct 5th and pt was restarted on Losartan by Dr. Ricard Dillon.   The patient has been notified of the result and verbalized understanding.  All questions (if any) were answered. Wilma Flavin, RN 07/26/2019 4:10 PM

## 2019-07-29 ENCOUNTER — Encounter: Payer: Self-pay | Admitting: Cardiology

## 2019-07-29 ENCOUNTER — Other Ambulatory Visit: Payer: Self-pay

## 2019-07-29 ENCOUNTER — Ambulatory Visit (INDEPENDENT_AMBULATORY_CARE_PROVIDER_SITE_OTHER): Payer: Medicare Other | Admitting: Cardiology

## 2019-07-29 VITALS — BP 120/80 | HR 72 | Ht 65.0 in | Wt 133.0 lb

## 2019-07-29 DIAGNOSIS — I4729 Other ventricular tachycardia: Secondary | ICD-10-CM

## 2019-07-29 DIAGNOSIS — I255 Ischemic cardiomyopathy: Secondary | ICD-10-CM | POA: Diagnosis not present

## 2019-07-29 DIAGNOSIS — I5022 Chronic systolic (congestive) heart failure: Secondary | ICD-10-CM

## 2019-07-29 DIAGNOSIS — I351 Nonrheumatic aortic (valve) insufficiency: Secondary | ICD-10-CM

## 2019-07-29 DIAGNOSIS — I10 Essential (primary) hypertension: Secondary | ICD-10-CM

## 2019-07-29 DIAGNOSIS — I472 Ventricular tachycardia: Secondary | ICD-10-CM

## 2019-07-29 DIAGNOSIS — I38 Endocarditis, valve unspecified: Secondary | ICD-10-CM

## 2019-07-29 DIAGNOSIS — I25119 Atherosclerotic heart disease of native coronary artery with unspecified angina pectoris: Secondary | ICD-10-CM | POA: Diagnosis not present

## 2019-07-29 NOTE — Progress Notes (Signed)
Cardiology Office Note:    Date:  07/29/2019   ID:  Ryken Pudlo, DOB 1951-01-27, MRN [id number]  PCP:  [name], MD  Cardiologist:  [name], MD  Electrophysiologist:  None   Referring MD: [name], MD   Follow up visit.  History of Present Illness:    Lytle Marinacci is a 68 y.o. male with a hx of COPD, tobacco abuse (quit), hypothroidism, HTN, acute CHF/LV dysfunction (EF 35-40%), moderate-severe aortic regurgitation s/p pericardial AVR, CAD s/p CABG presents for follow up.  The patient was last seen by Fredia Sorrow on July 02, 2019.  During his visit he was just recently discharged from complicated hospitalization where he was transferred from [facility] due to heart failure and need for cardiac catheterization.  His current catheterization he was found to have multivessel disease.  As well as his echocardiogram showed severe aortic regurgitation.  He did undergo pericardial aortic valve replacement along with a four-vessel CABG on May 21, 2019.  During his visit was noted that post hospitalization he had 2 syncope episodes.,  His antihypertensive medications (Aldactone and losartan) were stopped.  His posthospitalization complicated with a right lower extremity cellulitis for which he was treated with clindamycin.  At his visit on July 02, 2019 he appeared to be improving from a cardiovascular standpoint.  No medications were changed at that time.  Today he is here for follow-up visit.  He offers no complaints.  He has been started back on his losartan 25 mg daily and amiodarone was stopped.   Past Medical History:  Diagnosis Date  . Aortic regurgitation   . Chronic combined systolic and diastolic CHF (congestive heart failure) (HCC)    a. LVEF 35-40% at [place] in 04/2019, 45-50% at [facility] pre-AVR. 25-30% by repeat echo 05/2019.  [name] COPD (chronic obstructive pulmonary disease) (HCC)   . Coronary  artery disease    a. s/p CABGx4 in 05/2019.  [name] Essential hypertension   . Hypothyroid   . PAD (peripheral artery disease) (HCC)    a. R/L ABIs indicated mild lower extremity arterial disease and carotid duplex showed 1-39% stenosis.  [name] Postoperative atrial fibrillation (HCC)   . S/P aortic valve replacement with bioprosthetic valve 05/21/2019   25 mm Edwards Inspiris Resilia stented bovine pericardial tissue valve  . 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 Tobacco abuse     Past Surgical History:  Procedure Laterality Date  . AORTIC VALVE REPLACEMENT N/A 05/21/2019   Procedure: AORTIC VALVE REPLACEMENT (AVR) using Inspiris Valve size 7mm;  Surgeon: Purcell Nails, MD;  Location: [facility] OR;  Service: Open Heart Surgery;  Laterality: N/A;  . CORONARY ARTERY BYPASS GRAFT N/A 05/21/2019   Procedure: CORONARY ARTERY BYPASS GRAFTING (CABG) x Four , using left internal mammary artery and right leg greater saphenous vein harvested endoscopically;  Surgeon: Purcell Nails, MD;  Location: [facility] OR;  Service: Open Heart Surgery;  Laterality: N/A;  . RIGHT/LEFT HEART CATH AND CORONARY ANGIOGRAPHY N/A 05/17/2019   Procedure: RIGHT/LEFT HEART CATH AND CORONARY ANGIOGRAPHY;  Surgeon: Tonny Bollman, MD;  Location: [facility] INVASIVE CV LAB;  Service: Cardiovascular;  Laterality: N/A;  . TEE WITHOUT CARDIOVERSION N/A 05/21/2019   Procedure: TRANSESOPHAGEAL ECHOCARDIOGRAM (TEE);  Surgeon: Purcell Nails, MD;  Location: [facility] OR;  Service: Open Heart Surgery;  Laterality: N/A;    Current Medications: Current Meds  Medication Sig  . aspirin EC 325 MG EC tablet Take 1 tablet (325 mg total) by mouth daily.  [name] atorvastatin (LIPITOR) 80 MG tablet Take 1 tablet (80 mg total) by mouth daily at 6 PM.  . dextromethorphan-guaiFENesin (MUCINEX DM) 30-600 MG 12hr tablet Take 1 tablet by mouth 2 (two) times daily as needed for cough.  . levothyroxine (SYNTHROID) 112  MCG tablet TAKE 1 TABLET DAILY IN THE MORNING ON AN EMPTY STOMACH FOR HYPOTHYROIDISM  . losartan (COZAAR) 25 MG tablet Take 1 tablet (25 mg total) by mouth daily.     Allergies:   Hydrocodone   Social History   Socioeconomic History  . Marital status: Married    Spouse name: Not on file  . Number of children: Not on file  . Years of education: Not on file  . Highest education level: Not on file  Occupational History  . Not on file  Social Needs  . Financial resource strain: Not on file  . Food insecurity    Worry: Not on file    Inability: Not on file  . Transportation needs    Medical: Not on file    Non-medical: Not on file  Tobacco Use  . Smoking status: Former Smoker    Types: Cigarettes    Quit date: 05/17/2019    Years since quitting: 0.2  . Smokeless tobacco: Never Used  Substance and Sexual Activity  . Alcohol use: Not Currently  . Drug use: Not on file  . Sexual activity: Not on file  Lifestyle  . Physical activity    Days per week: Not on file    Minutes per session: Not on file  . Stress: Not on file  Relationships  . Social Musician on phone: Not on file    Gets together: Not on file    Attends religious service: Not on file    Active member of club or organization: Not on file    Attends meetings of clubs or organizations: Not on file    Relationship status: Not on file  Other Topics Concern  . Not on file  Social History Narrative  . Not on file     Family History: The patient's family history includes COPD in his father; Cancer in his mother.  ROS:   Review of Systems  Constitution: Negative for decreased appetite, fever and weight gain.  HENT: Negative for congestion, ear discharge, hoarse voice and sore throat.   Eyes: Negative for discharge, redness, vision loss in right eye and visual halos.  Cardiovascular: Negative for chest pain, dyspnea on exertion, leg swelling, orthopnea and palpitations.  Respiratory: Negative for  cough, hemoptysis, shortness of breath and snoring.   Endocrine: Negative for heat intolerance and polyphagia.  Hematologic/Lymphatic: Negative for bleeding problem. Does not bruise/bleed easily.  Skin: Negative for flushing, nail changes, rash and suspicious lesions.  Musculoskeletal: Negative for arthritis, joint pain, muscle cramps, myalgias, neck pain and stiffness.  Gastrointestinal: Negative for abdominal pain, bowel incontinence, diarrhea and excessive appetite.  Genitourinary: Negative for decreased libido, genital sores and incomplete emptying.  Neurological: Negative for brief paralysis, focal weakness, headaches and loss of balance.  Psychiatric/Behavioral: Negative for altered mental status, depression and suicidal ideas.  Allergic/Immunologic: Negative for HIV exposure and persistent infections.    EKGs/Labs/Other Studies Reviewed:    The following studies were reviewed today:   EKG: None performed today  Holter monitor 07/25/2019 Sinus rhythm with 1st degree AV block Rare premature atrial contractions  Rare premature ventricular contractions One 8 beat run of non-sustained ventricular tachycardia.  No evidence of atrial fibrillation  2D echocardiogram performed on June 07, 2019 IMPRESSIONS:  1. The left ventricle has severely reduced systolic function, with an ejection fraction of 25-30%. The cavity size was severely dilated. Left ventricular diastolic parameters were normal.  2. LV function appears markedly worse since surgery.  3. The right ventricle has normal systolic function. The cavity was normal. There is no increase in right ventricular wall thickness.  4. Moderate thickening of the mitral valve leaflet. Mild calcification of the mitral valve leaflet. There is moderate mitral annular calcification present.  5. Post AVR with 25 mm Bovine edwards Inspiris valve no PVL and reasonable gradients with normal DVI 0.42.  6. The aorta is normal unless otherwise noted.   Recent Labs: 05/18/2019: ALT 11 06/18/2019: BUN 17; Creatinine, Ser 0.95; Hemoglobin 10.4; Magnesium 2.1; Platelets 264; Sodium 140; TSH 15.100 06/19/2019: Potassium 4.2  Recent Lipid Panel No results found for: CHOL, TRIG, HDL, CHOLHDL, VLDL, LDLCALC, LDLDIRECT  Physical Exam:    VS:  BP 120/80 (BP Location: Left Arm, Patient Position: Sitting, Cuff Size: Normal)   Pulse 72   Ht 5\' 5"  (1.651 m)   Wt 133 lb (60.3 kg)   SpO2 93%   BMI 22.13 kg/m     Wt Readings from Last 3 Encounters:  07/29/19 133 lb (60.3 kg)  07/22/19 133 lb 6.4 oz (60.5 kg)  07/15/19 132 lb (59.9 kg)     GEN: Well nourished, well developed in no acute distress HEENT: Normal NECK: No JVD; No carotid bruits LYMPHATICS: No lymphadenopathy CARDIAC: S1S2 noted,RRR, no murmurs, rubs, gallops RESPIRATORY:  Clear to auscultation without rales, wheezing or rhonchi  ABDOMEN: Soft, non-tender, non-distended, +bowel sounds, no guarding. EXTREMITIES: No edema, No cyanosis, no clubbing MUSCULOSKELETAL:  No edema; No deformity  SKIN: Warm and dry NEUROLOGIC:  Alert and oriented x 3, non-focal PSYCHIATRIC:  Normal affect, good insight  ASSESSMENT:    1. NSVT (nonsustained ventricular tachycardia) (HCC)   2. Coronary artery disease involving native coronary artery of native heart with angina pectoris (HCC)   3. Chronic systolic congestive heart failure (HCC)   4. Ischemic cardiomyopathy   5. Aortic valve insufficiency, etiology of cardiac valve disease unspecified   6. Essential hypertension   7. Valvular heart disease    PLAN:    1. Mr. Tamera StandsStoneman appear to be improving from a cardiovascular standpoint.    Has been started back on losartan 25 mg.  His blood pressure is acceptable in the office today at 120/6580mmHg.  He is a good candidate for cardiac rehab I referred patient to [facility] cardiac [facility]hab program.  He was educated about this.  2.  I reviewed his recent ambulatory monitor there is evidence of 8  beat run of nonsustained ventricular tachycardia. His LV EF was  25-30% in August 2020. With the NSVT, I will like to have the patient wear life vest until we can get a repeat TTE in November.   4. No changes in his medications today.  The patient was asked to please bring all his medication to his next visit.    5.  Blood work for [employer]MP, mag and profile will be performed prior to his next visit.  6.  I did discuss the [place] DMV medical guidelines for driving: "it is prudent to recommend that all persons should be free of syncopal episodes for at least six months to be granted  the driving privilege." (THE [place] PHYSICIAN'S GUIDE TO DRIVER MEDICAL EVALUATION, Second Edition, Medical Review Branch, Associate ProfessorDriver License Section, Division of [employer], [employer] [place] Department of Transportation, July 2004).   The patient is in agreement with the above plan. The patient left the office in stable condition.  The patient will follow up in 3 months.   Medication Adjustments/Labs and Tests Ordered: Current medicines are reviewed at length with the patient today.  Concerns regarding medicines are outlined above.  Orders Placed This Encounter  Procedures  . Basic Metabolic Panel (BMET)  . Magnesium  . Lipid Profile   No orders of the defined types were placed in this encounter.   Call [facility] at [phone] ask for cardiac rehabilitation to enroll, I have sent a referral form the office.     Patient Instructions  Medication Instructions:  .istc  If you need a refill on your cardiac medications before your next appointment, please call your pharmacy.   Lab work: Your physician recommends that you return for lab work in: Before next week BMP,Magnesium,LIPID  If you have labs (blood work) drawn today and your tests are completely normal, you will receive your results only by: [name]. MyChart Message (if you have MyChart) OR . A paper copy in the mail If you have any lab test that is  abnormal or we need to change your treatment, we will call you to review the results.  Testing/Procedures: None  Follow-Up: At [facility], you and your health needs are our priority.  As part of our continuing mission to provide you with exceptional heart care, we have created designated Provider Care Teams.  These Care Teams include your primary Cardiologist (physician) and Advanced Practice Providers (APPs -  Physician Assistants and Nurse Practitioners) who all work together to provide you with the care you need, when you need it. You will need a follow up appointment in 3 months with Dr Servando Salinatobb Any Other Special Instructions Will Be Listed Below (If Applicable).  You are being referred to [facility]. They will contact you with date and times.     Adopting a Healthy Lifestyle.  Know what a healthy weight is for you (roughly BMI <25) and aim to maintain this   Aim for 7+ servings of fruits and vegetables daily   65-80+ fluid ounces of water or unsweet tea for healthy kidneys   Limit to max 1 drink of alcohol per day; avoid smoking/tobacco   Limit animal fats in diet for cholesterol and heart health - choose grass fed whenever available   Avoid highly processed foods, and foods high in saturated/trans fats   Aim for low stress - take time to unwind and care for your mental health   Aim for 150 min of moderate intensity exercise weekly for heart health, and weights twice weekly for bone health   Aim for 7-9 hours of sleep daily   When it comes to diets, agreement about the perfect plan isnt easy to find, even among the experts. Experts at the [employer] developed an idea known as the Healthy Eating Plate. Just imagine a plate divided into logical, healthy portions.   The emphasis is on diet quality:   Load up on vegetables and fruits - one-half of your plate: Aim for color and variety, and remember that potatoes dont count.   Go  for whole grains - one-quarter of your plate: Whole wheat, barley, wheat berries, quinoa, oats, brown  rice, and foods made with them. If you want pasta, go with whole wheat pasta.   Protein power - one-quarter of your plate: Fish, chicken, beans, and nuts are all healthy, versatile protein sources. Limit red meat.   The diet, however, does go beyond the plate, offering a few other suggestions.   Use healthy plant oils, such as olive, canola, soy, corn, sunflower and peanut. Check the labels, and avoid partially hydrogenated oil, which have unhealthy trans fats.   If youre thirsty, drink water. Coffee and tea are good in moderation, but skip sugary drinks and limit milk and dairy products to one or two daily servings.   The type of carbohydrate in the diet is more important than the amount. Some sources of carbohydrates, such as vegetables, fruits, whole grains, and beans-are healthier than others.   Finally, stay active  Signed, Berniece Salines, DO  07/29/2019 11:29 AM    [place]

## 2019-07-29 NOTE — Patient Instructions (Addendum)
Medication Instructions:  .istc  If you need a refill on your cardiac medications before your next appointment, please call your pharmacy.   Lab work: Your physician recommends that you return for lab work in: Before next week BMP,Magnesium,LIPID  If you have labs (blood work) drawn today and your tests are completely normal, you will receive your results only by: [name] MyChart Message (if you have MyChart) OR . A paper copy in the mail If you have any lab test that is abnormal or we need to change your treatment, we will call you to review the results.  Testing/Procedures: None  Follow-Up: At [facility], you and your health needs are our priority.  As part of our continuing mission to provide you with exceptional heart care, we have created designated Provider Care Teams.  These Care Teams include your primary Cardiologist (physician) and Advanced Practice Providers (APPs -  Physician Assistants and Nurse Practitioners) who all work together to provide you with the care you need, when you need it. You will need a follow up appointment in 3 months with Dr Harriet Masson Any Other Special Instructions Will Be Listed Below (If Applicable).  You are being referred to [place]. They will contact you with date and times.

## 2019-07-31 ENCOUNTER — Telehealth: Payer: Self-pay

## 2019-07-31 LAB — LIPID PANEL
Chol/HDL Ratio: 3.3 ratio (ref 0.0–5.0)
Cholesterol, Total: 109 mg/dL (ref 100–199)
HDL: 33 mg/dL — ABNORMAL LOW (ref >39)
LDL Chol Calc (NIH): 60 mg/dL (ref 0–99)
Triglycerides: 80 mg/dL (ref 0–149)
VLDL Cholesterol Cal: 16 mg/dL (ref 5–40)

## 2019-07-31 LAB — BASIC METABOLIC PANEL
BUN/Creatinine Ratio: 21 (ref 10–24)
BUN: 21 mg/dL (ref 8–27)
CO2: 23 mmol/L (ref 20–29)
Calcium: 9.4 mg/dL (ref 8.6–10.2)
Chloride: 108 mmol/L — ABNORMAL HIGH (ref 96–106)
Creatinine, Ser: 0.99 mg/dL (ref 0.76–1.27)
GFR calc Af Amer: 91 mL/min/{1.73_m2} (ref >59)
GFR calc non Af Amer: 79 mL/min/{1.73_m2} (ref >59)
Glucose: 78 mg/dL (ref 65–99)
Potassium: 5.1 mmol/L (ref 3.5–5.2)
Sodium: 144 mmol/L (ref 134–144)

## 2019-07-31 LAB — MAGNESIUM: Magnesium: 1.9 mg/dL (ref 1.6–2.3)

## 2019-07-31 NOTE — Telephone Encounter (Signed)
Please let Dennis Gomez know there is a great benefit for him to wear the LifeVest.  Especially given the fact that his monitor showed an 8 beat run of nonsustained ventricular tachycardia.  As I explained to him this vest will help with providing shock if he ever found himself in that the situation patient with multiple runs of ventricular tachycardia which could lead to sudden cardiac death.

## 2019-07-31 NOTE — Telephone Encounter (Signed)
Called home phone with no answer. Called [name] and urged him to discuss with patient the importance of continuing to wear life vest. Was informed that patient is continuing to chop wood and do strenuous outdoor activies, even though he was advised not to. Dr. Harriet Masson updated on patient status.

## 2019-07-31 NOTE — Telephone Encounter (Signed)
Patient came in for labs today with complaints about his life vest. He states it is very uncomfortable because he says the strap is cutting into his neck and ridges bother his side at night when he is trying to sleep. C.Lockhart,RN was advised that patient wanted to speak to Dr. Terrial Rhodes nurse and D.Rebeca Alert took triage message at door. Patient states he is not guaranteed to continue wearing this device until end of week. DRebeca Alert advised the importance of following Dr. Terrial Rhodes order and advised that patient would be contacted later today with updates.

## 2019-08-06 ENCOUNTER — Other Ambulatory Visit: Payer: Self-pay | Admitting: Physician Assistant

## 2019-08-19 ENCOUNTER — Other Ambulatory Visit: Payer: Self-pay | Admitting: *Deleted

## 2019-08-19 ENCOUNTER — Telehealth: Payer: Self-pay | Admitting: Cardiology

## 2019-08-19 MED ORDER — ATORVASTATIN CALCIUM 80 MG PO TABS: 80.0000 mg | ORAL_TABLET | Freq: Every day | ORAL | 1 refills | Status: DC

## 2019-08-19 NOTE — Telephone Encounter (Signed)
Has questions about atorvastatin 

## 2019-08-19 NOTE — Telephone Encounter (Signed)
Refill called in to [place]

## 2019-08-19 NOTE — Telephone Encounter (Signed)
Telephone call back to patient. Need refill on atorvastatin 80 mg

## 2019-09-09 ENCOUNTER — Other Ambulatory Visit (INDEPENDENT_AMBULATORY_CARE_PROVIDER_SITE_OTHER): Payer: Medicare Other

## 2019-09-09 ENCOUNTER — Other Ambulatory Visit: Payer: Self-pay

## 2019-09-09 DIAGNOSIS — I38 Endocarditis, valve unspecified: Secondary | ICD-10-CM

## 2019-09-09 DIAGNOSIS — I351 Nonrheumatic aortic (valve) insufficiency: Secondary | ICD-10-CM

## 2019-09-09 NOTE — Addendum Note (Signed)
Addended by: Particia Nearing B on: 09/09/2019 08:18 AM   Modules accepted: Orders

## 2019-09-09 NOTE — Progress Notes (Signed)
Complete echocardiogram has been performed.  Jimmy Capri Raben RDCS, RVT 

## 2019-09-11 DIAGNOSIS — E038 Other specified hypothyroidism: Secondary | ICD-10-CM | POA: Insufficient documentation

## 2019-09-11 DIAGNOSIS — E063 Autoimmune thyroiditis: Secondary | ICD-10-CM

## 2019-09-11 HISTORY — DX: Autoimmune thyroiditis: E06.3

## 2019-09-20 ENCOUNTER — Ambulatory Visit (INDEPENDENT_AMBULATORY_CARE_PROVIDER_SITE_OTHER): Payer: Medicare Other | Admitting: Cardiology

## 2019-09-20 ENCOUNTER — Other Ambulatory Visit: Payer: Self-pay

## 2019-09-20 ENCOUNTER — Encounter: Payer: Self-pay | Admitting: Cardiology

## 2019-09-20 VITALS — BP 110/64 | HR 72 | Ht 65.0 in | Wt 136.0 lb

## 2019-09-20 DIAGNOSIS — I2510 Atherosclerotic heart disease of native coronary artery without angina pectoris: Secondary | ICD-10-CM | POA: Diagnosis not present

## 2019-09-20 DIAGNOSIS — R55 Syncope and collapse: Secondary | ICD-10-CM | POA: Diagnosis not present

## 2019-09-20 DIAGNOSIS — E785 Hyperlipidemia, unspecified: Secondary | ICD-10-CM

## 2019-09-20 DIAGNOSIS — I10 Essential (primary) hypertension: Secondary | ICD-10-CM

## 2019-09-20 DIAGNOSIS — Z720 Tobacco use: Secondary | ICD-10-CM

## 2019-09-20 DIAGNOSIS — Z953 Presence of xenogenic heart valve: Secondary | ICD-10-CM

## 2019-09-20 NOTE — Progress Notes (Signed)
Cardiology Office Note:    Date:  09/20/2019   ID:  Dennis Gomez, DOB Jan 10, 1951, MRN [id number]  PCP:  [name], MD  Cardiologist:  [name], DO  Electrophysiologist:  None   Referring MD: [name], MD   Chief Complaint  Patient presents with  . Follow-up    History of Present Illness:    Dennis Gomez is a 68 y.o. male with a hx of COPD, tobacco abuse(quit), hypothroidism, HTN, acute CHF/LV dysfunction (EF 35-40%), moderate-severe aortic regurgitation s/p AVR with inspiris 65mm bioprosthesis, CAD s/p CABG X4 presents for follow up. He has been doing well and offers no complaints at this time. He states that he been able to do activities around his house like cutting wood with no problem.   He offers no complaints at this time.  Past Medical History:  Diagnosis Date  . Aortic regurgitation   . Chronic combined systolic and diastolic CHF (congestive heart failure) (HCC)    a. LVEF 35-40% at [place] in 04/2019, 45-50% at [facility] pre-AVR. 25-30% by repeat echo 05/2019.  [name] COPD (chronic obstructive pulmonary disease) (HCC)   . Coronary artery disease    a. s/p CABGx4 in 05/2019.  [name] Essential hypertension   . Hypothyroid   . PAD (peripheral artery disease) (HCC)    a. R/L ABIs indicated mild lower extremity arterial disease and carotid duplex showed 1-39% stenosis.  [name] Postoperative atrial fibrillation (HCC)   . S/P aortic valve replacement with bioprosthetic valve 05/21/2019   25 mm Edwards Inspiris Resilia stented bovine pericardial tissue valve  . 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 Tobacco abuse     Past Surgical History:  Procedure Laterality Date  . AORTIC VALVE REPLACEMENT N/A 05/21/2019   Procedure: AORTIC VALVE REPLACEMENT (AVR) using Inspiris Valve size 33mm;  Surgeon: Purcell Nails, MD;  Location: [facility] OR;  Service: Open Heart Surgery;  Laterality: N/A;  . CORONARY ARTERY  BYPASS GRAFT N/A 05/21/2019   Procedure: CORONARY ARTERY BYPASS GRAFTING (CABG) x Four , using left internal mammary artery and right leg greater saphenous vein harvested endoscopically;  Surgeon: Purcell Nails, MD;  Location: [facility] OR;  Service: Open Heart Surgery;  Laterality: N/A;  . RIGHT/LEFT HEART CATH AND CORONARY ANGIOGRAPHY N/A 05/17/2019   Procedure: RIGHT/LEFT HEART CATH AND CORONARY ANGIOGRAPHY;  Surgeon: Tonny Bollman, MD;  Location: [facility] INVASIVE CV LAB;  Service: Cardiovascular;  Laterality: N/A;  . TEE WITHOUT CARDIOVERSION N/A 05/21/2019   Procedure: TRANSESOPHAGEAL ECHOCARDIOGRAM (TEE);  Surgeon: Purcell Nails, MD;  Location: [facility] OR;  Service: Open Heart Surgery;  Laterality: N/A;    Current Medications: Current Meds  Medication Sig  . aspirin EC 325 MG EC tablet Take 1 tablet (325 mg total) by mouth daily.  [name] atorvastatin (LIPITOR) 80 MG tablet Take 1 tablet (80 mg total) by mouth daily at 6 PM.  . dextromethorphan-guaiFENesin (MUCINEX DM) 30-600 MG 12hr tablet Take 1 tablet by mouth 2 (two) times daily as needed for cough.  . levothyroxine (SYNTHROID) 112 MCG tablet TAKE 1 TABLET DAILY IN THE MORNING ON AN EMPTY STOMACH FOR HYPOTHYROIDISM  . losartan (COZAAR) 25 MG tablet Take 1 tablet (25 mg total) by mouth daily.     Allergies:   Hydrocodone   Social History   Socioeconomic History  . Marital status: Married    Spouse name: Not on file  .  Number of children: Not on file  . Years of education: Not on file  . Highest education level: Not on file  Occupational History  . Not on file  Social Needs  . Financial resource strain: Not on file  . Food insecurity    Worry: Not on file    Inability: Not on file  . Transportation needs    Medical: Not on file    Non-medical: Not on file  Tobacco Use  . Smoking status: Former Smoker    Types: Cigarettes    Quit date: 05/17/2019    Years since quitting: 0.3  . Smokeless tobacco: Never Used  Substance and Sexual  Activity  . Alcohol use: Not Currently  . Drug use: Not on file  . Sexual activity: Not on file  Lifestyle  . Physical activity    Days per week: Not on file    Minutes per session: Not on file  . Stress: Not on file  Relationships  . Social Herbalist on phone: Not on file    Gets together: Not on file    Attends religious service: Not on file    Active member of club or organization: Not on file    Attends meetings of clubs or organizations: Not on file    Relationship status: Not on file  Other Topics Concern  . Not on file  Social History Narrative  . Not on file     Family History: The patient's family history includes COPD in his father; Cancer in his mother.  ROS:   Review of Systems  Constitution: Negative for decreased appetite, fever and weight gain.  HENT: Negative for congestion, ear discharge, hoarse voice and sore throat.   Eyes: Negative for discharge, redness, vision loss in right eye and visual halos.  Cardiovascular: Negative for chest pain, dyspnea on exertion, leg swelling, orthopnea and palpitations.  Respiratory: Negative for cough, hemoptysis, shortness of breath and snoring.   Endocrine: Negative for heat intolerance and polyphagia.  Hematologic/Lymphatic: Negative for bleeding problem. Does not bruise/bleed easily.  Skin: Negative for flushing, nail changes, rash and suspicious lesions.  Musculoskeletal: Negative for arthritis, joint pain, muscle cramps, myalgias, neck pain and stiffness.  Gastrointestinal: Negative for abdominal pain, bowel incontinence, diarrhea and excessive appetite.  Genitourinary: Negative for decreased libido, genital sores and incomplete emptying.  Neurological: Negative for brief paralysis, focal weakness, headaches and loss of balance.  Psychiatric/Behavioral: Negative for altered mental status, depression and suicidal ideas.  Allergic/Immunologic: Negative for HIV exposure and persistent infections.     EKGs/Labs/Other Studies Reviewed:    The following studies were reviewed today:   EKG:  None today.  Recent Labs: 05/18/2019: ALT 11 06/18/2019: Hemoglobin 10.4; Platelets 264; TSH 15.100 07/31/2019: BUN 21; Creatinine, Ser 0.99; Magnesium 1.9; Potassium 5.1; Sodium 144  Recent Lipid Panel    Component Value Date/Time   CHOL 109 07/31/2019 0855   TRIG 80 07/31/2019 0855   HDL 33 (L) 07/31/2019 0855   CHOLHDL 3.3 07/31/2019 0855   LDLCALC 60 07/31/2019 0855    Physical Exam:    VS:  BP 110/64 (BP Location: Left Arm, Patient Position: Sitting, Cuff Size: Normal)   Pulse 72   Ht 5\' 5"  (1.651 m)   Wt 136 lb (61.7 kg)   SpO2 98%   BMI 22.63 kg/m     Wt Readings from Last 3 Encounters:  09/20/19 136 lb (61.7 kg)  07/29/19 133 lb (60.3 kg)  07/22/19 133 lb 6.4  oz (60.5 kg)     GEN: Well nourished, well developed in no acute distress HEENT: Normal NECK: No JVD; No carotid bruits LYMPHATICS: No lymphadenopathy CARDIAC: S1S2 noted,RRR, no murmurs, rubs, gallops RESPIRATORY:  Clear to auscultation without rales, wheezing or rhonchi  ABDOMEN: Soft, non-tender, non-distended, +bowel sounds, no guarding. EXTREMITIES: No edema, No cyanosis, no clubbing MUSCULOSKELETAL:  No edema; No deformity  SKIN: Warm and dry NEUROLOGIC:  Alert and oriented x 3, non-focal PSYCHIATRIC:  Normal affect, good insight  ASSESSMENT:    1. Coronary artery disease involving native coronary artery of native heart without angina pectoris   2. Essential hypertension   3. Syncope, unspecified syncope type   4. Hyperlipidemia LDL goal <70   5. S/P aortic valve replacement with bioprosthetic valve    6. Tobacco abuse    PLAN:    Abdulmalik is here today for follow-up visit he is with his wife, appears to be doing well from a cardiovascular standpoint.  I am very happy with his improvement in his ejection fraction which was 25 to 30% on June 10, 2019 his repeat echocardiogram on September 09, 2019 showed  an EF of 60 to 65%.  He no longer needs a LifeVest this has been returned to [place].   Continue patient's aspirin 81 mg as well as his lovastatin 80 mg daily.  His blood pressure is acceptable today we will continue keep him on his losartan 25 mg daily.  Tobacco use - the patient was counseled on tobacco cessation today for 5 minutes.  Counseling included reviewing the risks of smoking tobacco products, how it impacts the patient's current medical diagnoses and different strategies for quitting.  Pharmacotherapy to aid in tobacco cessation was not prescribed today. The patient coordinate with  primary care provider.  The patient was also advised to call  1-800-QUIT-NOW (682-829-3070) for additional help with quitting smoking.  Syncope-no more syncope episodes since his surgery.   Ascending aortic aneurysm 45 millimeters-discussed this diagnosis with the patient and his wife we will repeat imaging in 12 months.  The patient is in agreement with the above plan. The patient left the office in stable condition.  The patient will follow up in 6 months or sooner if needed.   Medication Adjustments/Labs and Tests Ordered: Current medicines are reviewed at length with the patient today.  Concerns regarding medicines are outlined above.  No orders of the defined types were placed in this encounter.  No orders of the defined types were placed in this encounter.   Patient Instructions  Medication Instructions:  Your physician recommends that you continue on your current medications as directed. Please refer to the Current Medication list given to you today.  *If you need a refill on your cardiac medications before your next appointment, please call your pharmacy*  Lab Work: None If you have labs (blood work) drawn today and your tests are completely normal, you will receive your results only by: [name] MyChart Message (if you have MyChart) OR . A paper copy in the mail If you have any lab test that  is abnormal or we need to change your treatment, we will call you to review the results.  Testing/Procedures: None  Follow-Up: At [facility], you and your health needs are our priority.  As part of our continuing mission to provide you with exceptional heart care, we have created designated Provider Care Teams.  These Care Teams include your primary Cardiologist (physician) and Advanced Practice Providers (APPs -  Physician  Assistants and Nurse Practitioners) who all work together to provide you with the care you need, when you need it.  Your next appointment:   6 month(s)  The format for your next appointment:   In Person  Provider:   Thomasene RippleKardie Ariadne Rissmiller, DO  Other Instructions      Adopting a Healthy Lifestyle.  Know what a healthy weight is for you (roughly BMI <25) and aim to maintain this   Aim for 7+ servings of fruits and vegetables daily   65-80+ fluid ounces of water or unsweet tea for healthy kidneys   Limit to max 1 drink of alcohol per day; avoid smoking/tobacco   Limit animal fats in diet for cholesterol and heart health - choose grass fed whenever available   Avoid highly processed foods, and foods high in saturated/trans fats   Aim for low stress - take time to unwind and care for your mental health   Aim for 150 min of moderate intensity exercise weekly for heart health, and weights twice weekly for bone health   Aim for 7-9 hours of sleep daily   When it comes to diets, agreement about the perfect plan isnt easy to find, even among the experts. Experts at the [employer] developed an idea known as the Healthy Eating Plate. Just imagine a plate divided into logical, healthy portions.   The emphasis is on diet quality:   Load up on vegetables and fruits - one-half of your plate: Aim for color and variety, and remember that potatoes dont count.   Go for whole grains - one-quarter of your plate: Whole wheat, barley, wheat berries,  quinoa, oats, brown rice, and foods made with them. If you want pasta, go with whole wheat pasta.   Protein power - one-quarter of your plate: Fish, chicken, beans, and nuts are all healthy, versatile protein sources. Limit red meat.   The diet, however, does go beyond the plate, offering a few other suggestions.   Use healthy plant oils, such as olive, canola, soy, corn, sunflower and peanut. Check the labels, and avoid partially hydrogenated oil, which have unhealthy trans fats.   If youre thirsty, drink water. Coffee and tea are good in moderation, but skip sugary drinks and limit milk and dairy products to one or two daily servings.   The type of carbohydrate in the diet is more important than the amount. Some sources of carbohydrates, such as vegetables, fruits, whole grains, and beans-are healthier than others.   Finally, stay active  Signed, Thomasene RippleKardie Lenya Sterne, DO  09/20/2019 9:18 PM    [facility]

## 2019-09-20 NOTE — Patient Instructions (Signed)
Medication Instructions:  Your physician recommends that you continue on your current medications as directed. Please refer to the Current Medication list given to you today.  *If you need a refill on your cardiac medications before your next appointment, please call your pharmacy*  Lab Work: None If you have labs (blood work) drawn today and your tests are completely normal, you will receive your results only by: . MyChart Message (if you have MyChart) OR . A paper copy in the mail If you have any lab test that is abnormal or we need to change your treatment, we will call you to review the results.  Testing/Procedures: None  Follow-Up: At [facility], you and your health needs are our priority.  As part of our continuing mission to provide you with exceptional heart care, we have created designated Provider Care Teams.  These Care Teams include your primary Cardiologist (physician) and Advanced Practice Providers (APPs -  Physician Assistants and Nurse Practitioners) who all work together to provide you with the care you need, when you need it.  Your next appointment:   6 month(s)  The format for your next appointment:   In Person  Provider:   Kardie Tobb, DO  Other Instructions   

## 2019-10-25 ENCOUNTER — Telehealth: Payer: Self-pay | Admitting: Cardiology

## 2019-10-25 DIAGNOSIS — I5022 Chronic systolic (congestive) heart failure: Secondary | ICD-10-CM

## 2019-10-25 DIAGNOSIS — R040 Epistaxis: Secondary | ICD-10-CM

## 2019-10-25 MED ORDER — ASPIRIN EC 81 MG PO TBEC: 81.0000 mg | DELAYED_RELEASE_TABLET | Freq: Every day | ORAL | 3 refills | Status: DC

## 2019-10-25 NOTE — Telephone Encounter (Signed)
I agree, let him hold the Aspirin for 5 days. Restart the Aspirin at 81 mg daily not 325mg  daily. Have him come get a cbc next week.

## 2019-10-25 NOTE — Addendum Note (Signed)
Addended by: Fayrene Fearing B on: 10/25/2019 12:17 PM   Modules accepted: Orders

## 2019-10-25 NOTE — Telephone Encounter (Signed)
Telephone call to patient. Spoke with wife who states he has had a nosebleed for 2 days . Takes aspirin 325 mg for the last 6 months but unsure who put him on that dose. Told him to hold for a couple of days and I will speak with Dr Servando Salina to see if she wants to change anything and call them back.

## 2019-10-25 NOTE — Telephone Encounter (Signed)
Telephone call back to patient. Informed to hold aspirin for 5 days then start on aspirin 81 mg daily.and to come in for a CBC lab next week. Pt verbalized understanding.

## 2019-10-25 NOTE — Telephone Encounter (Signed)
Bayer asprin is making his nose bleed

## 2019-10-28 ENCOUNTER — Telehealth: Payer: Self-pay | Admitting: Cardiology

## 2019-10-28 NOTE — Telephone Encounter (Signed)
Attempted to call spouse back, no answer.  Left detailed message advising OTC cough medications recommended avoiding ingredient of Dextromethorphan.  Look for meds specifically designed for pt with HBP.  Call back if any further questions.

## 2019-10-28 NOTE — Telephone Encounter (Signed)
Patient is having some cough and wants to take something else besides Mucinex and he is also still having issue with his BP fluctuating.Dustin Flock call his wife

## 2019-10-29 LAB — CBC
Hematocrit: 41.4 % (ref 37.5–51.0)
Hemoglobin: 13.3 g/dL (ref 13.0–17.7)
MCH: 28.4 pg (ref 26.6–33.0)
MCHC: 32.1 g/dL (ref 31.5–35.7)
MCV: 88 fL (ref 79–97)
Platelets: 223 10*3/uL (ref 150–450)
RBC: 4.69 x10E6/uL (ref 4.14–5.80)
RDW: 13.4 % (ref 11.6–15.4)
WBC: 8.9 10*3/uL (ref 3.4–10.8)

## 2019-11-25 ENCOUNTER — Telehealth: Payer: Self-pay | Admitting: Cardiology

## 2019-11-25 DIAGNOSIS — I5022 Chronic systolic (congestive) heart failure: Secondary | ICD-10-CM

## 2019-11-25 DIAGNOSIS — R040 Epistaxis: Secondary | ICD-10-CM

## 2019-11-25 NOTE — Telephone Encounter (Signed)
Pt c/o medication issue:  1. Name of Medication:  losartan (COZAAR) 25 MG tablet aspirin EC 81 MG tablet  2. How are you currently taking this medication (dosage and times per day)? losartan once a day, Asprin once a day   3. Are you having a reaction (difficulty breathing--STAT)? no  4. What is your medication issue? Patient's wife states the patient has been having nose bleeds and says he was going to stop taking the medications.

## 2019-11-25 NOTE — Telephone Encounter (Signed)
Please advise 

## 2019-11-25 NOTE — Telephone Encounter (Signed)
Spoke with patient wife who says they stopped the aspirin for a week and then start 81 mg daily. Started it back last week and his nose is still bleeding.They want to stop the aspirin. Please advise.

## 2019-11-26 NOTE — Telephone Encounter (Signed)
Spoke with wife. Patient will come in tomorrow for a CBC and appointment made for next week 12/03/19 at 1020.Told to hold aspirin until then.Wife verbalized understanding.

## 2019-11-26 NOTE — Telephone Encounter (Signed)
Please have him come in and get a CBC. Also he can hold the aspirin for now.  I can him in a week

## 2019-11-26 NOTE — Addendum Note (Signed)
Addended by: Fayrene Fearing B on: 11/26/2019 01:58 PM   Modules accepted: Orders

## 2019-11-27 LAB — CBC
Hematocrit: 39.3 % (ref 37.5–51.0)
Hemoglobin: 12.6 g/dL — ABNORMAL LOW (ref 13.0–17.7)
MCH: 27.9 pg (ref 26.6–33.0)
MCHC: 32.1 g/dL (ref 31.5–35.7)
MCV: 87 fL (ref 79–97)
Platelets: 197 10*3/uL (ref 150–450)
RBC: 4.51 x10E6/uL (ref 4.14–5.80)
RDW: 13.6 % (ref 11.6–15.4)
WBC: 8 10*3/uL (ref 3.4–10.8)

## 2019-12-03 ENCOUNTER — Other Ambulatory Visit: Payer: Self-pay

## 2019-12-03 ENCOUNTER — Ambulatory Visit (INDEPENDENT_AMBULATORY_CARE_PROVIDER_SITE_OTHER): Payer: Medicare Other | Admitting: Cardiology

## 2019-12-03 ENCOUNTER — Encounter: Payer: Self-pay | Admitting: Cardiology

## 2019-12-03 VITALS — BP 128/78 | HR 79 | Ht 65.0 in | Wt 143.0 lb

## 2019-12-03 DIAGNOSIS — Z720 Tobacco use: Secondary | ICD-10-CM

## 2019-12-03 DIAGNOSIS — R040 Epistaxis: Secondary | ICD-10-CM

## 2019-12-03 DIAGNOSIS — I2510 Atherosclerotic heart disease of native coronary artery without angina pectoris: Secondary | ICD-10-CM

## 2019-12-03 DIAGNOSIS — Z952 Presence of prosthetic heart valve: Secondary | ICD-10-CM | POA: Diagnosis not present

## 2019-12-03 NOTE — Patient Instructions (Signed)
Medication Instructions:  Your physician recommends that you continue on your current medications as directed. Please refer to the Current Medication list given to you today.  *If you need a refill on your cardiac medications before your next appointment, please call your pharmacy*  Lab Work: None If you have labs (blood work) drawn today and your tests are completely normal, you will receive your results only by: [name] MyChart Message (if you have MyChart) OR . A paper copy in the mail If you have any lab test that is abnormal or we need to change your treatment, we will call you to review the results.  Testing/Procedures: None  Follow-Up: At [facility], you and your health needs are our priority.  As part of our continuing mission to provide you with exceptional heart care, we have created designated Provider Care Teams.  These Care Teams include your primary Cardiologist (physician) and Advanced Practice Providers (APPs -  Physician Assistants and Nurse Practitioners) who all work together to provide you with the care you need, when you need it.  Your next appointment:   1 month(s)  The format for your next appointment:   In Person  Provider:   [name], DO  Other Instructions YOU ARE BEING REFERRED TO AN ENT DR MA IN [place]. THEY WILL CONTACT YOU WITH AN APPOINTMENT TIME AND DATE

## 2019-12-03 NOTE — Progress Notes (Signed)
Cardiology Office Note:    Date:  12/03/2019   ID:  [name], DOB [birth year], MRN [id number]  PCP:  Serita Grammes, MD  Cardiologist:  Berniece Salines, DO  Electrophysiologist:  None   Referring MD: Serita Grammes, MD   Chief Complaint  Patient presents with  . Follow-up    nosebleeds    History of Present Illness:    Dennis Gomez a 69 y.o.malewith a hx of COPD, tobacco abuse(quit), hypothroidism,HTN, acute CHF/LV dysfunction (EF 35-40%), moderate-severe aortic regurgitation s/p AVR with inspiris 64mm bioprosthesis, CAD s/p CABG X4 presents for follow up.  The patient seen September 20, 2019 at that time he appears to be doing well from a cardiovascular standpoint.  His EF had improved he returns his zoll lifevest.   In terms of patient wife called twice reporting that he has been experiencing reporting that he has been experiencing nosebleed.  Initially we asked the patient to hold the aspirin for several days and restart after.  Was able to hold the aspirin his nosebleed stopped once he restarted it nosebleed started again per his wife.  Denies any chest pain, shortness of breath.  Past Medical History:  Diagnosis Date  . Aortic regurgitation   . Chronic combined systolic and diastolic CHF (congestive heart failure) ([place])    a. LVEF 35-40% at [place] in 04/2019, 45-50% at [facility] pre-AVR. 25-30% by repeat echo 05/2019.  [name] COPD (chronic obstructive pulmonary disease) ([place])   . Coronary artery disease    a. s/p CABGx4 in 05/2019.  [name] Essential hypertension   . Hypothyroid   . PAD (peripheral artery disease) ([facility])    a. R/L ABIs indicated mild lower extremity arterial disease and carotid duplex showed 1-39% stenosis.  [name] Postoperative atrial fibrillation ([place])   . S/P aortic valve replacement with bioprosthetic valve 05/21/2019   25 mm Edwards Inspiris Resilia stented bovine pericardial tissue valve  . S/P CABG x 4 05/21/2019   LIMA to LAD, SVG to D1, SVG to OM, SVG to  LPDA with EVH via right thigh and leg  . Tobacco abuse     Past Surgical History:  Procedure Laterality Date  . AORTIC VALVE REPLACEMENT N/A 05/21/2019   Procedure: AORTIC VALVE REPLACEMENT (AVR) using Inspiris Valve size 38mm;  Surgeon: Rexene Alberts, MD;  Location: [facility];  Service: Open Heart Surgery;  Laterality: N/A;  . CORONARY ARTERY BYPASS GRAFT N/A 05/21/2019   Procedure: CORONARY ARTERY BYPASS GRAFTING (CABG) x Four , using left internal mammary artery and right leg greater saphenous vein harvested endoscopically;  Surgeon: Rexene Alberts, MD;  Location: [facility];  Service: Open Heart Surgery;  Laterality: N/A;  . RIGHT/LEFT HEART CATH AND CORONARY ANGIOGRAPHY N/A 05/17/2019   Procedure: RIGHT/LEFT HEART CATH AND CORONARY ANGIOGRAPHY;  Surgeon: Sherren Mocha, MD;  Location: [facility];  Service: Cardiovascular;  Laterality: N/A;  . TEE WITHOUT CARDIOVERSION N/A 05/21/2019   Procedure: TRANSESOPHAGEAL ECHOCARDIOGRAM (TEE);  Surgeon: Rexene Alberts, MD;  Location: [facility];  Service: Open Heart Surgery;  Laterality: N/A;    Current Medications: Current Meds  Medication Sig  . aspirin EC 81 MG tablet Take 1 tablet (81 mg total) by mouth daily.  [name] atorvastatin (LIPITOR) 80 MG tablet Take 1 tablet (80 mg total) by mouth daily at 6 PM.  . dextromethorphan-guaiFENesin (MUCINEX DM) 30-600 MG 12hr tablet Take 1 tablet by mouth 2 (two) times daily as needed for cough.  . levothyroxine (SYNTHROID) 112 MCG tablet TAKE 1 TABLET  DAILY IN THE MORNING ON AN EMPTY STOMACH FOR HYPOTHYROIDISM  . losartan (COZAAR) 25 MG tablet Take 1 tablet (25 mg total) by mouth daily.     Allergies:   Hydrocodone   Social History   Socioeconomic History  . Marital status: Married    Spouse name: Not on file  . Number of children: Not on file  . Years of education: Not on file  . Highest education level: Not on file  Occupational History  . Not on file  Tobacco Use  . Smoking status: Former Smoker     Types: Cigarettes    Quit date: 05/17/2019    Years since quitting: 0.5  . Smokeless tobacco: Never Used  Substance and Sexual Activity  . Alcohol use: Not Currently  . Drug use: Not on file  . Sexual activity: Not on file  Other Topics Concern  . Not on file  Social History Narrative  . Not on file   Social Determinants of Health   Financial Resource Strain:   . Difficulty of Paying Living Expenses: Not on file  Food Insecurity:   . Worried About Programme researcher, broadcasting/film/video in the Last Year: Not on file  . Ran Out of Food in the Last Year: Not on file  Transportation Needs:   . Lack of Transportation (Medical): Not on file  . Lack of Transportation (Non-Medical): Not on file  Physical Activity:   . Days of Exercise per Week: Not on file  . Minutes of Exercise per Session: Not on file  Stress:   . Feeling of Stress : Not on file  Social Connections:   . Frequency of Communication with Friends and Family: Not on file  . Frequency of Social Gatherings with Friends and Family: Not on file  . Attends Religious Services: Not on file  . Active Member of Clubs or Organizations: Not on file  . Attends Banker Meetings: Not on file  . Marital Status: Not on file     Family History: The patient's family history includes COPD in his father; Cancer in his mother.  ROS:   Review of Systems  Constitution: Negative for decreased appetite, fever and weight gain.  HENT: Negative for congestion, ear discharge, hoarse voice and sore throat.   Eyes: Negative for discharge, redness, vision loss in right eye and visual halos.  Cardiovascular: Negative for chest pain, dyspnea on exertion, leg swelling, orthopnea and palpitations.  Respiratory: Negative for cough, hemoptysis, shortness of breath and snoring.   Endocrine: Negative for heat intolerance and polyphagia.  Hematologic/Lymphatic: Negative for bleeding problem. Does not bruise/bleed easily.  Skin: Negative for flushing, nail  changes, rash and suspicious lesions.  Musculoskeletal: Negative for arthritis, joint pain, muscle cramps, myalgias, neck pain and stiffness.  Gastrointestinal: Negative for abdominal pain, bowel incontinence, diarrhea and excessive appetite.  Genitourinary: Negative for decreased libido, genital sores and incomplete emptying.  Neurological: Negative for brief paralysis, focal weakness, headaches and loss of balance.  Psychiatric/Behavioral: Negative for altered mental status, depression and suicidal ideas.  Allergic/Immunologic: Negative for HIV exposure and persistent infections.    EKGs/Labs/Other Studies Reviewed:    The following studies were reviewed today:   EKG:  The ekg ordered today demonstrates   Recent Labs: 05/18/2019: ALT 11 06/18/2019: TSH 15.100 07/31/2019: BUN 21; Creatinine, Ser 0.99; Magnesium 1.9; Potassium 5.1; Sodium 144 11/27/2019: Hemoglobin 12.6; Platelets 197  Recent Lipid Panel    Component Value Date/Time   CHOL 109 07/31/2019 0855   TRIG  80 07/31/2019 0855   HDL 33 (L) 07/31/2019 0855   CHOLHDL 3.3 07/31/2019 0855   LDLCALC 60 07/31/2019 0855    Physical Exam:    VS:  BP 128/78 (BP Location: Right Arm, Patient Position: Sitting, Cuff Size: Normal)   Pulse 79   Ht 5\' 5"  (1.651 m)   Wt 143 lb (64.9 kg)   SpO2 98%   BMI 23.80 kg/m     Wt Readings from Last 3 Encounters:  12/03/19 143 lb (64.9 kg)  09/20/19 136 lb (61.7 kg)  07/29/19 133 lb (60.3 kg)     GEN: Well nourished, well developed in no acute distress HEENT: Normal NECK: No JVD; No carotid bruits LYMPHATICS: No lymphadenopathy CARDIAC: S1S2 noted,RRR, no murmurs, rubs, gallops RESPIRATORY:  Clear to auscultation without rales, wheezing or rhonchi  ABDOMEN: Soft, non-tender, non-distended, +bowel sounds, no guarding. EXTREMITIES: No edema, No cyanosis, no clubbing MUSCULOSKELETAL:  No deformity  SKIN: Warm and dry NEUROLOGIC:  Alert and oriented x 3, non-focal PSYCHIATRIC:  Normal  affect, good insight  ASSESSMENT:    1. Epistaxis   2. S/P AVR   3. Coronary artery disease involving native coronary artery of native heart without angina pectoris   4. Tobacco use    PLAN:     I discussed with the patient and his wife that he actually really does need to be on the aspirin 81 mg daily.  Is on hold for now.  I have referred the patient to ENT for evaluation as he may need to be cauterized in order for me to safely resume his aspirin 81 mg daily.  Coronary artery disease-his aspirin is on hold for now pending ENT evaluation.  Continue with atorvastatin 80 mg.   Status post AVR - holding Aspirin for now pending ENT evaluation.  His blood pressure is acceptable in the office today.  Losartan 25 mg daily.  His beta-blocker was held in the past due to low systolic blood pressures.  I discussed with the patient and his wife the patient tells me that his systolic blood pressure at home is usually averaging between 99-115 mmHg.   Tobacco use-the patient was counseled on tobacco cessation today for 5 minutes.  Counseling included reviewing the risks of smoking tobacco products, how it impacts the patient's current medical diagnoses and different strategies for quitting.  Pharmacotherapy to aid in tobacco cessation was not prescribed today. The patient coordinate with  primary care provider.  The patient was also advised to call   1-800-QUIT-NOW (814-745-0123) for additional help with quitting smoking.   The patient is in agreement with the above plan. The patient left the office in stable condition.  The patient will follow up in office sooner if needed   Medication Adjustments/Labs and Tests Ordered: Current medicines are reviewed at length with the patient today.  Concerns regarding medicines are outlined above.  Orders Placed This Encounter  Procedures  . Ambulatory referral to ENT   No orders of the defined types were placed in this encounter.   Patient Instructions   Medication Instructions:  Your physician recommends that you continue on your current medications as directed. Please refer to the Current Medication list given to you today.  *If you need a refill on your cardiac medications before your next appointment, please call your pharmacy*  Lab Work: None If you have labs (blood work) drawn today and your tests are completely normal, you will receive your results only by: [phone] MyChart Message (if you have  MyChart) OR . A paper copy in the mail If you have any lab test that is abnormal or we need to change your treatment, we will call you to review the results.  Testing/Procedures: None  Follow-Up: At [facility], you and your health needs are our priority.  As part of our continuing mission to provide you with exceptional heart care, we have created designated Provider Care Teams.  These Care Teams include your primary Cardiologist (physician) and Advanced Practice Providers (APPs -  Physician Assistants and Nurse Practitioners) who all work together to provide you with the care you need, when you need it.  Your next appointment:   1 month(s)  The format for your next appointment:   In Person  Provider:   [name], DO  Other Instructions YOU ARE BEING REFERRED TO AN ENT DR MA IN [place]. THEY WILL CONTACT YOU WITH AN APPOINTMENT TIME AND DATE     Adopting a Healthy Lifestyle.  Know what a healthy weight is for you (roughly BMI <25) and aim to maintain this   Aim for 7+ servings of fruits and vegetables daily   65-80+ fluid ounces of water or unsweet tea for healthy kidneys   Limit to max 1 drink of alcohol per day; avoid smoking/tobacco   Limit animal fats in diet for cholesterol and heart health - choose grass fed whenever available   Avoid highly processed foods, and foods high in saturated/trans fats   Aim for low stress - take time to unwind and care for your mental health   Aim for 150 min of moderate intensity  exercise weekly for heart health, and weights twice weekly for bone health   Aim for 7-9 hours of sleep daily   When it comes to diets, agreement about the perfect plan isnt easy to find, even among the experts. Experts at the [facility] [employer] developed an idea known as the Healthy Eating Plate. Just imagine a plate divided into logical, healthy portions.   The emphasis is on diet quality:   Load up on vegetables and fruits - one-half of your plate: Aim for color and variety, and remember that potatoes dont count.   Go for whole grains - one-quarter of your plate: Whole wheat, barley, wheat berries, quinoa, oats, brown rice, and foods made with them. If you want pasta, go with whole wheat pasta.   Protein power - one-quarter of your plate: Fish, chicken, beans, and nuts are all healthy, versatile protein sources. Limit red meat.   The diet, however, does go beyond the plate, offering a few other suggestions.   Use healthy plant oils, such as olive, canola, soy, corn, sunflower and peanut. Check the labels, and avoid partially hydrogenated oil, which have unhealthy trans fats.   If youre thirsty, drink water. Coffee and tea are good in moderation, but skip sugary drinks and limit milk and dairy products to one or two daily servings.   The type of carbohydrate in the diet is more important than the amount. Some sources of carbohydrates, such as vegetables, fruits, whole grains, and beans-are healthier than others.   Finally, stay active  Signed, [name], DO  12/03/2019 11:08 AM    [facility]

## 2019-12-04 NOTE — Telephone Encounter (Signed)
New message   Patient's wife has questions about holding aspirin. Please advise.

## 2019-12-04 NOTE — Telephone Encounter (Signed)
Spoke with Dr Servando Salina who says to hold Aspirin until he sees the ENT doctor.Telephone call to patient. Informed of Dr Mallory Shirk recommendation.

## 2019-12-31 ENCOUNTER — Other Ambulatory Visit: Payer: Self-pay

## 2019-12-31 ENCOUNTER — Ambulatory Visit (INDEPENDENT_AMBULATORY_CARE_PROVIDER_SITE_OTHER): Payer: Medicare Other | Admitting: Cardiology

## 2019-12-31 ENCOUNTER — Encounter: Payer: Self-pay | Admitting: Cardiology

## 2019-12-31 VITALS — BP 122/90 | HR 69 | Ht 65.0 in | Wt 141.0 lb

## 2019-12-31 DIAGNOSIS — R040 Epistaxis: Secondary | ICD-10-CM | POA: Diagnosis not present

## 2019-12-31 DIAGNOSIS — N183 Chronic kidney disease, stage 3 unspecified: Secondary | ICD-10-CM

## 2019-12-31 DIAGNOSIS — E785 Hyperlipidemia, unspecified: Secondary | ICD-10-CM

## 2019-12-31 DIAGNOSIS — Z953 Presence of xenogenic heart valve: Secondary | ICD-10-CM

## 2019-12-31 DIAGNOSIS — I10 Essential (primary) hypertension: Secondary | ICD-10-CM

## 2019-12-31 DIAGNOSIS — Z720 Tobacco use: Secondary | ICD-10-CM

## 2019-12-31 DIAGNOSIS — I25119 Atherosclerotic heart disease of native coronary artery with unspecified angina pectoris: Secondary | ICD-10-CM

## 2019-12-31 MED ORDER — LOSARTAN POTASSIUM 25 MG PO TABS: 25.0000 mg | ORAL_TABLET | Freq: Every day | ORAL | 3 refills | Status: DC

## 2019-12-31 MED ORDER — ATORVASTATIN CALCIUM 80 MG PO TABS: 80.0000 mg | ORAL_TABLET | Freq: Every day | ORAL | 3 refills | Status: AC

## 2019-12-31 NOTE — Progress Notes (Signed)
Cardiology Office Note:    Date:  12/31/2019   ID:  Dennis Gomez, DOB 1951/07/09, MRN [id number]  PCP:  [name], MD  Cardiologist:  [name], DO  Electrophysiologist:  None   Referring MD: [name], MD   Chief Complaint  Patient presents with  . Follow-up    History of Present Illness:    Dennis Gomez a 69 y.o.malewith a hx of COPD, tobacco abuse(quit), hypothroidism,HTN, acute CHF/LV dysfunction ( EF now 60-65% 08/2019- improvement from 35-40%), moderate-severe aortic regurgitation s/p AVRwith inspiris 84mm bioprosthesis, CAD s/p CABGX4presents for follow up.  I saw the patient on December 03, 2019 to be evaluatedfor nose bleeding, at that time aspirin was held and I recommended he see ENT.  He is here for follow-up visit. He has not been able to see ENT. He is still experiencing bleeding in his nose, now with clots.    Past Medical History:  Diagnosis Date  . Acute kidney injury (HCC) 05/17/2019  . Aortic regurgitation   . Chest pain 05/16/2019  . Chronic combined systolic and diastolic CHF (congestive heart failure) (HCC)    a. LVEF 35-40% at [facility] in 04/2019, 45-50% at [facility] pre-AVR. 25-30% by repeat echo 05/2019.  [name] Chronic kidney disease (CKD), stage III (moderate)   . Chronic systolic congestive heart failure (HCC)   . COPD (chronic obstructive pulmonary disease) (HCC)   . Coronary artery disease    a. s/p CABGx4 in 05/2019.  [name] Essential hypertension   . Hyperlipidemia LDL goal <70   . Hypothyroid   . Hypothyroidism 05/16/2019  . PAD (peripheral artery disease) (HCC)    a. R/L ABIs indicated mild lower extremity arterial disease and carotid duplex showed 1-39% stenosis.  [name] Postoperative atrial fibrillation (HCC)   . S/P aortic valve replacement with bioprosthetic valve 05/21/2019   25 mm Edwards Inspiris Resilia stented bovine pericardial tissue valve  . S/P CABG x 4 05/21/2019   LIMA to LAD, SVG to D1, SVG to OM, SVG to LPDA with EVH  via right thigh and leg  . Severe aortic regurgitation   . Syncope 06/17/2019  . Tobacco abuse     Past Surgical History:  Procedure Laterality Date  . AORTIC VALVE REPLACEMENT N/A 05/21/2019   Procedure: AORTIC VALVE REPLACEMENT (AVR) using Inspiris Valve size 53mm;  Surgeon: Purcell Nails, MD;  Location: [facility] OR;  Service: Open Heart Surgery;  Laterality: N/A;  . CORONARY ARTERY BYPASS GRAFT N/A 05/21/2019   Procedure: CORONARY ARTERY BYPASS GRAFTING (CABG) x Four , using left internal mammary artery and right leg greater saphenous vein harvested endoscopically;  Surgeon: Purcell Nails, MD;  Location: [facility] OR;  Service: Open Heart Surgery;  Laterality: N/A;  . RIGHT/LEFT HEART CATH AND CORONARY ANGIOGRAPHY N/A 05/17/2019   Procedure: RIGHT/LEFT HEART CATH AND CORONARY ANGIOGRAPHY;  Surgeon: Tonny Bollman, MD;  Location: [facility] INVASIVE CV LAB;  Service: Cardiovascular;  Laterality: N/A;  . TEE WITHOUT CARDIOVERSION N/A 05/21/2019   Procedure: TRANSESOPHAGEAL ECHOCARDIOGRAM (TEE);  Surgeon: Purcell Nails, MD;  Location: [facility] OR;  Service: Open Heart Surgery;  Laterality: N/A;    Current Medications: Current Meds  Medication Sig  . atorvastatin (LIPITOR) 80 MG tablet Take 1 tablet (80 mg total) by mouth daily at 6 PM.  . dextromethorphan-guaiFENesin (MUCINEX DM) 30-600 MG 12hr tablet Take 1 tablet by mouth 2 (two) times daily as needed for cough.  . levothyroxine (SYNTHROID) 112 MCG tablet TAKE 1 TABLET DAILY IN THE MORNING ON AN EMPTY  STOMACH FOR HYPOTHYROIDISM  . losartan (COZAAR) 25 MG tablet Take 1 tablet (25 mg total) by mouth daily.  . [DISCONTINUED] atorvastatin (LIPITOR) 80 MG tablet Take 1 tablet (80 mg total) by mouth daily at 6 PM.  . [DISCONTINUED] losartan (COZAAR) 25 MG tablet Take 1 tablet (25 mg total) by mouth daily.     Allergies:   Hydrocodone   Social History   Socioeconomic History  . Marital status: Married    Spouse name: Not on file  . Number of children: Not on  file  . Years of education: Not on file  . Highest education level: Not on file  Occupational History  . Not on file  Tobacco Use  . Smoking status: Former Smoker    Types: Cigarettes    Quit date: 05/17/2019    Years since quitting: 0.6  . Smokeless tobacco: Never Used  Substance and Sexual Activity  . Alcohol use: Not Currently  . Drug use: Not on file  . Sexual activity: Not on file  Other Topics Concern  . Not on file  Social History Narrative  . Not on file   Social Determinants of Health   Financial Resource Strain:   . Difficulty of Paying Living Expenses:   Food Insecurity:   . Worried About Charity fundraiser in the Last Year:   . Arboriculturist in the Last Year:   Transportation Needs:   . Film/video editor (Medical):   [name] Lack of Transportation (Non-Medical):   Physical Activity:   . Days of Exercise per Week:   . Minutes of Exercise per Session:   Stress:   . Feeling of Stress :   Social Connections:   . Frequency of Communication with Friends and Family:   . Frequency of Social Gatherings with Friends and Family:   . Attends Religious Services:   . Active Member of Clubs or Organizations:   . Attends Archivist Meetings:   [name] Marital Status:      Family History: The patient's family history includes COPD in his father; Cancer in his mother.  ROS:   Review of Systems  Constitution: Negative for decreased appetite, fever and weight gain.  HENT: Negative for congestion, ear discharge, hoarse voice and sore throat.   Eyes: Negative for discharge, redness, vision loss in right eye and visual halos.  Cardiovascular: Negative for chest pain, dyspnea on exertion, leg swelling, orthopnea and palpitations.  Respiratory: Negative for cough, hemoptysis, shortness of breath and snoring.   Endocrine: Negative for heat intolerance and polyphagia.  Hematologic/Lymphatic: Negative for bleeding problem. Does not bruise/bleed easily.  Skin: Negative for  flushing, nail changes, rash and suspicious lesions.  Musculoskeletal: Negative for arthritis, joint pain, muscle cramps, myalgias, neck pain and stiffness.  Gastrointestinal: Negative for abdominal pain, bowel incontinence, diarrhea and excessive appetite.  Genitourinary: Negative for decreased libido, genital sores and incomplete emptying.  Neurological: Negative for brief paralysis, focal weakness, headaches and loss of balance.  Psychiatric/Behavioral: Negative for altered mental status, depression and suicidal ideas.  Allergic/Immunologic: Negative for HIV exposure and persistent infections.    EKGs/Labs/Other Studies Reviewed:    The following studies were reviewed today:   EKG:  None today  TTE 08/2019 1. Left ventricular ejection fraction, by visual estimation, is 60 to  65%. The left ventricle has normal function. Left ventricular septal wall  thickness was normal. Normal left ventricular posterior wall thickness.  There is mildly increased left  ventricular hypertrophy.  2.  Left ventricular diastolic parameters are consistent with Grade II  diastolic dysfunction (pseudonormalization).  3. Global right ventricle has normal systolic function.The right  ventricular size is normal. No increase in right ventricular wall  thickness.  4. Left atrial size was normal.  5. Right atrial size was normal.  6. The mitral valve is normal in structure. No evidence of mitral valve  regurgitation. No evidence of mitral stenosis.  7. The tricuspid valve is normal in structure. Tricuspid valve  regurgitation is not demonstrated.  8. The pulmonic valve was normal in structure. Pulmonic valve  regurgitation is not visualized.  9. Aneurysm of the ascending aorta, measuring 46 mm.  10. Normal pulmonary artery systolic pressure.  11. There is moderate dilatation of the ascending aorta measuring 45 mm.  12. Aortic dilatation noted.  13. Satisfactory aortic bioprosthetic valve funtion.     Recent Labs: 05/18/2019: ALT 11 06/18/2019: TSH 15.100 07/31/2019: BUN 21; Creatinine, Ser 0.99; Magnesium 1.9; Potassium 5.1; Sodium 144 11/27/2019: Hemoglobin 12.6; Platelets 197  Recent Lipid Panel    Component Value Date/Time   CHOL 109 07/31/2019 0855   TRIG 80 07/31/2019 0855   HDL 33 (L) 07/31/2019 0855   CHOLHDL 3.3 07/31/2019 0855   LDLCALC 60 07/31/2019 0855    Physical Exam:    VS:  BP 122/90 (BP Location: Left Arm, Patient Position: Sitting, Cuff Size: Normal)   Pulse 69   Ht 5\' 5"  (1.651 m)   Wt 141 lb (64 kg)   SpO2 98%   BMI 23.46 kg/m     Wt Readings from Last 3 Encounters:  12/31/19 141 lb (64 kg)  12/03/19 143 lb (64.9 kg)  09/20/19 136 lb (61.7 kg)     GEN: Well nourished, well developed in no acute distress HEENT: Normal NECK: No JVD; No carotid bruits LYMPHATICS: No lymphadenopathy CARDIAC: S1S2 noted,RRR, no murmurs, rubs, gallops RESPIRATORY:  Clear to auscultation without rales, wheezing or rhonchi  ABDOMEN: Soft, non-tender, non-distended, +bowel sounds, no guarding. EXTREMITIES: No edema, No cyanosis, no clubbing MUSCULOSKELETAL:  No deformity  SKIN: Warm and dry NEUROLOGIC:  Alert and oriented x 3, non-focal PSYCHIATRIC:  Normal affect, good insight  ASSESSMENT:    1. Epistaxis   2. Essential hypertension   3. Coronary artery disease involving native coronary artery of native heart with angina pectoris (HCC)   4. Stage 3 chronic kidney disease, unspecified whether stage 3a or 3b CKD   5. S/P aortic valve replacement with bioprosthetic valve    6. Hyperlipidemia LDL goal <70   7. Tobacco abuse    PLAN:     1. He has been off the aspirin and is still experiencing some episodes of nose bleed. I was able to speak with ENT - pending his visit  It was advised that the patient start over the counter Affirn 2-3 spray daily and Nasal saline spray twice a day. I have advise the patient not to take the spray together. His appointment also have  been changed from 4/9 not to 01/08/20 at 1:30 pm - the patient and his wife are both aware.   With this recommendation above, I will restart his Aspirin 81 mg daily - he needs this medication for CAD and AVR. I have advised that if nose bleeding recur to go to the ED to be evaluated.  HTN - bp acceptable.   Ischemic CM - EF recovered now 60-65%- continue current medication regimen  HLN - continue his lipitor    The patient is  in agreement with the above plan. The patient left the office in stable condition.  The patient will follow up in 1 month or sooner if needed.   Medication Adjustments/Labs and Tests Ordered: Current medicines are reviewed at length with the patient today.  Concerns regarding medicines are outlined above.  No orders of the defined types were placed in this encounter.  Meds ordered this encounter  Medications  . losartan (COZAAR) 25 MG tablet    Sig: Take 1 tablet (25 mg total) by mouth daily.    Dispense:  90 tablet    Refill:  3  . atorvastatin (LIPITOR) 80 MG tablet    Sig: Take 1 tablet (80 mg total) by mouth daily at 6 PM.    Dispense:  90 tablet    Refill:  3    Patient Instructions  Medication Instructions:  Your physician has recommended you make the following change in your medication:   START Back: Aspirin 81 mg daily    USE Affrin 2-3 times twice daily  USE Nasal Saline spray 2-3 times daily   *If you need a refill on your cardiac medications before your next appointment, please call your pharmacy*   Lab Work: None.  If you have labs (blood work) drawn today and your tests are completely normal, you will receive your results only by: [name] MyChart Message (if you have MyChart) OR . A paper copy in the mail If you have any lab test that is abnormal or we need to change your treatment, we will call you to review the results.   Testing/Procedures: None.    Follow-Up: At [facility], you and your health needs are our priority.  As  part of our continuing mission to provide you with exceptional heart care, we have created designated Provider Care Teams.  These Care Teams include your primary Cardiologist (physician) and Advanced Practice Providers (APPs -  Physician Assistants and Nurse Practitioners) who all work together to provide you with the care you need, when you need it.  We recommend signing up for the patient portal called "MyChart".  Sign up information is provided on this After Visit Summary.  MyChart is used to connect with patients for Virtual Visits (Telemedicine).  Patients are able to view lab/test results, encounter notes, upcoming appointments, etc.  Non-urgent messages can be sent to your provider as well.   To learn more about what you can do with MyChart, go to [email or web address].    Your next appointment:   1 month(s)  The format for your next appointment:   In Person  Provider:   [name], DO   Other Instructions   Your ENT appt is March 24th at 130 pm     Adopting a Healthy Lifestyle.  Know what a healthy weight is for you (roughly BMI <25) and aim to maintain this   Aim for 7+ servings of fruits and vegetables daily   65-80+ fluid ounces of water or unsweet tea for healthy kidneys   Limit to max 1 drink of alcohol per day; avoid smoking/tobacco   Limit animal fats in diet for cholesterol and heart health - choose grass fed whenever available   Avoid highly processed foods, and foods high in saturated/trans fats   Aim for low stress - take time to unwind and care for your mental health   Aim for 150 min of moderate intensity exercise weekly for heart health, and weights twice weekly for bone health   Aim for 7-9 hours  of sleep daily   When it comes to diets, agreement about the perfect plan isnt easy to find, even among the experts. Experts at the [facility] [employer] developed an idea known as the Healthy Eating Plate. Just imagine a plate divided into  logical, healthy portions.   The emphasis is on diet quality:   Load up on vegetables and fruits - one-half of your plate: Aim for color and variety, and remember that potatoes dont count.   Go for whole grains - one-quarter of your plate: Whole wheat, barley, wheat berries, quinoa, oats, brown rice, and foods made with them. If you want pasta, go with whole wheat pasta.   Protein power - one-quarter of your plate: Fish, chicken, beans, and nuts are all healthy, versatile protein sources. Limit red meat.   The diet, however, does go beyond the plate, offering a few other suggestions.   Use healthy plant oils, such as olive, canola, soy, corn, sunflower and peanut. Check the labels, and avoid partially hydrogenated oil, which have unhealthy trans fats.   If youre thirsty, drink water. Coffee and tea are good in moderation, but skip sugary drinks and limit milk and dairy products to one or two daily servings.   The type of carbohydrate in the diet is more important than the amount. Some sources of carbohydrates, such as vegetables, fruits, whole grains, and beans-are healthier than others.   Finally, stay active  Signed, [name], DO  12/31/2019 11:17 AM    [facility]

## 2019-12-31 NOTE — Patient Instructions (Addendum)
Medication Instructions:  Your physician has recommended you make the following change in your medication:   START Back: Aspirin 81 mg daily    USE Affrin 2-3 times twice daily  USE Nasal Saline spray 2-3 times daily   *If you need a refill on your cardiac medications before your next appointment, please call your pharmacy*   Lab Work: None.  If you have labs (blood work) drawn today and your tests are completely normal, you will receive your results only by: [name] MyChart Message (if you have MyChart) OR . A paper copy in the mail If you have any lab test that is abnormal or we need to change your treatment, we will call you to review the results.   Testing/Procedures: None.    Follow-Up: At [facility], you and your health needs are our priority.  As part of our continuing mission to provide you with exceptional heart care, we have created designated Provider Care Teams.  These Care Teams include your primary Cardiologist (physician) and Advanced Practice Providers (APPs -  Physician Assistants and Nurse Practitioners) who all work together to provide you with the care you need, when you need it.  We recommend signing up for the patient portal called "MyChart".  Sign up information is provided on this After Visit Summary.  MyChart is used to connect with patients for Virtual Visits (Telemedicine).  Patients are able to view lab/test results, encounter notes, upcoming appointments, etc.  Non-urgent messages can be sent to your provider as well.   To learn more about what you can do with MyChart, go to [email or web address].    Your next appointment:   1 month(s)  The format for your next appointment:   In Person  Provider:   [name], DO   Other Instructions   Your ENT appt is March 24th at 130 pm

## 2020-01-31 ENCOUNTER — Other Ambulatory Visit: Payer: Self-pay

## 2020-01-31 ENCOUNTER — Encounter: Payer: Self-pay | Admitting: Cardiology

## 2020-01-31 ENCOUNTER — Ambulatory Visit (INDEPENDENT_AMBULATORY_CARE_PROVIDER_SITE_OTHER): Payer: Medicare Other | Admitting: Cardiology

## 2020-01-31 VITALS — BP 130/80 | HR 74 | Ht 65.0 in | Wt 143.0 lb

## 2020-01-31 DIAGNOSIS — Z720 Tobacco use: Secondary | ICD-10-CM

## 2020-01-31 DIAGNOSIS — E785 Hyperlipidemia, unspecified: Secondary | ICD-10-CM | POA: Diagnosis not present

## 2020-01-31 DIAGNOSIS — Z953 Presence of xenogenic heart valve: Secondary | ICD-10-CM | POA: Diagnosis not present

## 2020-01-31 DIAGNOSIS — Z951 Presence of aortocoronary bypass graft: Secondary | ICD-10-CM

## 2020-01-31 DIAGNOSIS — I10 Essential (primary) hypertension: Secondary | ICD-10-CM | POA: Diagnosis not present

## 2020-01-31 DIAGNOSIS — I25119 Atherosclerotic heart disease of native coronary artery with unspecified angina pectoris: Secondary | ICD-10-CM

## 2020-01-31 NOTE — Patient Instructions (Signed)
Medication Instructions:   Your physician recommends that you continue on your current medications as directed. Please refer to the Current Medication list given to you today.  *If you need a refill on your cardiac medications before your next appointment, please call your pharmacy*   Lab Work: NONE ORDERED  TODAY   If you have labs (blood work) drawn today and your tests are completely normal, you will receive your results only by: . MyChart Message (if you have MyChart) OR . A paper copy in the mail If you have any lab test that is abnormal or we need to change your treatment, we will call you to review the results.   Testing/Procedures: NONE ORDERED  TODAY   Follow-Up: At [facility], you and your health needs are our priority.  As part of our continuing mission to provide you with exceptional heart care, we have created designated Provider Care Teams.  These Care Teams include your primary Cardiologist (physician) and Advanced Practice Providers (APPs -  Physician Assistants and Nurse Practitioners) who all work together to provide you with the care you need, when you need it.  We recommend signing up for the patient portal called "MyChart".  Sign up information is provided on this After Visit Summary.  MyChart is used to connect with patients for Virtual Visits (Telemedicine).  Patients are able to view lab/test results, encounter notes, upcoming appointments, etc.  Non-urgent messages can be sent to your provider as well.   To learn more about what you can do with MyChart, go to [email or web address].    Your next appointment:   6 month(s)  The format for your next appointment:   In Person  Provider:   You will see Kardie Tobb, DO.  Or, you can be scheduled with the following Advanced Practice Provider on your designated Care Team (at our [place] Office):  Caitlin Walker, FNP     Other Instructions   

## 2020-01-31 NOTE — Progress Notes (Signed)
Cardiology Office Note:    Date:  01/31/2020   ID:  Dennis Gomez, DOB 12-02-1950, MRN [id number]  PCP:  [name], MD  Cardiologist:  [name], DO  Electrophysiologist:  None   Referring MD: [name], MD   Chief Complaint  Patient presents with  . Follow-up    History of Present Illness:    Dennis Gomez a 69 y.o.malewith a hx of COPD, tobacco abuse(quit), hypothroidism,HTN, acute CHF/LV dysfunction ( EF now 60-65% 08/2019- improvement from 35-40%), moderate-severe aortic regurgitation s/p AVRwith inspiris 64mm bioprosthesis, CAD s/p CABGX4presents for follow up.  At his last visit, I spoke briefly with ENT who recommended that that patient take affrin and saline spray/ This was helpful and I started the patient back on his Aspirin.   Today he is here for a follow up.  He has not had any repeat nosebleeds.  Afrin was stopped and patient has been asked to continue saline spray.  He tells me that he has been active. He work outside in his yard.  Past Medical History:  Diagnosis Date  . Acute kidney injury (HCC) 05/17/2019  . Aortic regurgitation   . Chest pain 05/16/2019  . Chronic combined systolic and diastolic CHF (congestive heart failure) (HCC)    a. LVEF 35-40% at [name] in 04/2019, 45-50% at [facility] pre-AVR. 25-30% by repeat echo 05/2019.  [name] Chronic kidney disease (CKD), stage III (moderate)   . Chronic systolic congestive heart failure (HCC)   . COPD (chronic obstructive pulmonary disease) (HCC)   . Coronary artery disease    a. s/p CABGx4 in 05/2019.  [name] Essential hypertension   . Hyperlipidemia LDL goal <70   . Hypothyroid   . Hypothyroidism 05/16/2019  . PAD (peripheral artery disease) (HCC)    a. R/L ABIs indicated mild lower extremity arterial disease and carotid duplex showed 1-39% stenosis.  [name] Postoperative atrial fibrillation (HCC)   . S/P aortic valve replacement with bioprosthetic valve 05/21/2019   25 mm Edwards Inspiris Resilia  stented bovine pericardial tissue valve  . 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 Severe aortic regurgitation   . Syncope 06/17/2019  . Tobacco abuse     Past Surgical History:  Procedure Laterality Date  . AORTIC VALVE REPLACEMENT N/A 05/21/2019   Procedure: AORTIC VALVE REPLACEMENT (AVR) using Inspiris Valve size 81mm;  Surgeon: Purcell Nails, MD;  Location: [facility] OR;  Service: Open Heart Surgery;  Laterality: N/A;  . CORONARY ARTERY BYPASS GRAFT N/A 05/21/2019   Procedure: CORONARY ARTERY BYPASS GRAFTING (CABG) x Four , using left internal mammary artery and right leg greater saphenous vein harvested endoscopically;  Surgeon: Purcell Nails, MD;  Location: [facility] OR;  Service: Open Heart Surgery;  Laterality: N/A;  . RIGHT/LEFT HEART CATH AND CORONARY ANGIOGRAPHY N/A 05/17/2019   Procedure: RIGHT/LEFT HEART CATH AND CORONARY ANGIOGRAPHY;  Surgeon: Tonny Bollman, MD;  Location: [facility] INVASIVE CV LAB;  Service: Cardiovascular;  Laterality: N/A;  . TEE WITHOUT CARDIOVERSION N/A 05/21/2019   Procedure: TRANSESOPHAGEAL ECHOCARDIOGRAM (TEE);  Surgeon: Purcell Nails, MD;  Location: [facility] OR;  Service: Open Heart Surgery;  Laterality: N/A;    Current Medications: Current Meds  Medication Sig  . aspirin EC 81 MG tablet Take 1 tablet (81 mg total) by mouth daily.  [name] atorvastatin (LIPITOR) 80 MG tablet Take 1 tablet (80 mg total) by mouth daily at 6  PM.  . levothyroxine (SYNTHROID) 112 MCG tablet TAKE 1 TABLET DAILY IN THE MORNING ON AN EMPTY STOMACH FOR HYPOTHYROIDISM  . losartan (COZAAR) 25 MG tablet Take 1 tablet (25 mg total) by mouth daily.     Allergies:   Hydrocodone   Social History   Socioeconomic History  . Marital status: Married    Spouse name: Not on file  . Number of children: Not on file  . Years of education: Not on file  . Highest education level: Not on file  Occupational History  . Not on file  Tobacco Use   . Smoking status: Former Smoker    Types: Cigarettes    Quit date: 05/17/2019    Years since quitting: 0.7  . Smokeless tobacco: Never Used  Substance and Sexual Activity  . Alcohol use: Not Currently  . Drug use: Not on file  . Sexual activity: Not on file  Other Topics Concern  . Not on file  Social History Narrative  . Not on file   Social Determinants of Health   Financial Resource Strain:   . Difficulty of Paying Living Expenses:   Food Insecurity:   . Worried About Programme researcher, broadcasting/film/video in the Last Year:   . Barista in the Last Year:   Transportation Needs:   . Freight forwarder (Medical):   [name] Lack of Transportation (Non-Medical):   Physical Activity:   . Days of Exercise per Week:   . Minutes of Exercise per Session:   Stress:   . Feeling of Stress :   Social Connections:   . Frequency of Communication with Friends and Family:   . Frequency of Social Gatherings with Friends and Family:   . Attends Religious Services:   . Active Member of Clubs or Organizations:   . Attends Banker Meetings:   [name] Marital Status:      Family History: The patient's family history includes COPD in his father; Cancer in his mother.  ROS:   Review of Systems  Constitution: Negative for decreased appetite, fever and weight gain.  HENT: Negative for congestion, ear discharge, hoarse voice and sore throat.   Eyes: Negative for discharge, redness, vision loss in right eye and visual halos.  Cardiovascular: Negative for chest pain, dyspnea on exertion, leg swelling, orthopnea and palpitations.  Respiratory: Negative for cough, hemoptysis, shortness of breath and snoring.   Endocrine: Negative for heat intolerance and polyphagia.  Hematologic/Lymphatic: Negative for bleeding problem. Does not bruise/bleed easily.  Skin: Negative for flushing, nail changes, rash and suspicious lesions.  Musculoskeletal: Negative for arthritis, joint pain, muscle cramps, myalgias,  neck pain and stiffness.  Gastrointestinal: Negative for abdominal pain, bowel incontinence, diarrhea and excessive appetite.  Genitourinary: Negative for decreased libido, genital sores and incomplete emptying.  Neurological: Negative for brief paralysis, focal weakness, headaches and loss of balance.  Psychiatric/Behavioral: Negative for altered mental status, depression and suicidal ideas.  Allergic/Immunologic: Negative for HIV exposure and persistent infections.    EKGs/Labs/Other Studies Reviewed:    The following studies were reviewed today:   EKG:  None today  TTE 08/2019 1. Left ventricular ejection fraction, by visual estimation, is 60 to 65%. The left ventricle has normal function. Left ventricular septal wall thickness was normal. Normal left ventricular posterior wall thickness.  There is mildly increased left ventricular hypertrophy.  2. Left ventricular diastolic parameters are consistent with Grade II diastolic dysfunction (pseudonormalization).  3. Global right ventricle has normal systolic function.The right  ventricular size is normal. No increase in right ventricular wall thickness.  4. Left atrial size was normal.  5. Right atrial size was normal.  6. The mitral valve is normal in structure. No evidence of mitral valve regurgitation. No evidence of mitral stenosis.  7. The tricuspid valve is normal in structure. Tricuspid valve regurgitation is not demonstrated.  8. The pulmonic valve was normal in structure. Pulmonic valve regurgitation is not visualized.  9. Aneurysm of the ascending aorta, measuring 46 mm.  10. Normal pulmonary artery systolic pressure.  11. There is moderate dilatation of the ascending aorta measuring 45 mm.  12. Aortic dilatation noted.  13. Satisfactory aortic bioprosthetic valve funtion.   Recent Labs: 05/18/2019: ALT 11 06/18/2019: TSH 15.100 07/31/2019: BUN 21; Creatinine, Ser 0.99; Magnesium 1.9; Potassium 5.1; Sodium 144  11/27/2019: Hemoglobin 12.6; Platelets 197  Recent Lipid Panel    Component Value Date/Time   CHOL 109 07/31/2019 0855   TRIG 80 07/31/2019 0855   HDL 33 (L) 07/31/2019 0855   CHOLHDL 3.3 07/31/2019 0855   LDLCALC 60 07/31/2019 0855    Physical Exam:    VS:  BP 130/80 (BP Location: Right Arm, Patient Position: Sitting, Cuff Size: Normal)   Pulse 74   Ht 5\' 5"  (1.651 m)   Wt 143 lb (64.9 kg)   SpO2 97%   BMI 23.80 kg/m     Wt Readings from Last 3 Encounters:  01/31/20 143 lb (64.9 kg)  12/31/19 141 lb (64 kg)  12/03/19 143 lb (64.9 kg)     GEN: Well nourished, well developed in no acute distress HEENT: Normal NECK: No JVD; No carotid bruits LYMPHATICS: No lymphadenopathy CARDIAC: S1S2 noted,RRR, no murmurs, rubs, gallops RESPIRATORY:  Clear to auscultation without rales, wheezing or rhonchi  ABDOMEN: Soft, non-tender, non-distended, +bowel sounds, no guarding. EXTREMITIES: No edema, No cyanosis, no clubbing MUSCULOSKELETAL:  No deformity  SKIN: Warm and dry NEUROLOGIC:  Alert and oriented x 3, non-focal PSYCHIATRIC:  Normal affect, good insight  ASSESSMENT:    1. S/P aortic valve replacement with bioprosthetic valve    2. Essential hypertension   3. Coronary artery disease involving native coronary artery of native heart with angina pectoris (HCC)   4. Hyperlipidemia LDL goal <70   5. Tobacco abuse   6. S/P CABG x 4    PLAN:    Continue patient on Aspirin 81 mg daily- thankfully no more bleeding.  His blood pressure is acceptable, there will be no changes in her medication. CAD continue ASA and statin. Hyperlipidemia - continue his statin.  Tobacco use - he continues to smoke, and does not plan to quit at this time.  The patient is in agreement with the above plan. The patient left the office in stable condition.  The patient will follow up in 6 months or sooner if needed.   Medication Adjustments/Labs and Tests Ordered: Current medicines are reviewed at  length with the patient today.  Concerns regarding medicines are outlined above.  No orders of the defined types were placed in this encounter.  No orders of the defined types were placed in this encounter.   Patient Instructions  Medication Instructions:   Your physician recommends that you continue on your current medications as directed. Please refer to the Current Medication list given to you today.   *If you need a refill on your cardiac medications before your next appointment, please call your pharmacy*   Lab Work: NONE ORDERED  TODAY  If you have  labs (blood work) drawn today and your tests are completely normal, you will receive your results only by: [name] MyChart Message (if you have MyChart) OR . A paper copy in the mail If you have any lab test that is abnormal or we need to change your treatment, we will call you to review the results.   Testing/Procedures: NONE ORDERED  TODAY    Follow-Up: At [facility], you and your health needs are our priority.  As part of our continuing mission to provide you with exceptional heart care, we have created designated Provider Care Teams.  These Care Teams include your primary Cardiologist (physician) and Advanced Practice Providers (APPs -  Physician Assistants and Nurse Practitioners) who all work together to provide you with the care you need, when you need it.  We recommend signing up for the patient portal called "MyChart".  Sign up information is provided on this After Visit Summary.  MyChart is used to connect with patients for Virtual Visits (Telemedicine).  Patients are able to view lab/test results, encounter notes, upcoming appointments, etc.  Non-urgent messages can be sent to your provider as well.   To learn more about what you can do with MyChart, go to [email or web address].    Your next appointment:   6 month(s)  The format for your next appointment:   In Person  Provider:   You will see Berniece Salines, DO.  Or,  you can be scheduled with the following Advanced Practice Provider on your designated Care Team (at our [facility]):  Laurann Montana, FNP     Other Instructions      Adopting a Healthy Lifestyle.  Know what a healthy weight is for you (roughly BMI <25) and aim to maintain this   Aim for 7+ servings of fruits and vegetables daily   65-80+ fluid ounces of water or unsweet tea for healthy kidneys   Limit to max 1 drink of alcohol per day; avoid smoking/tobacco   Limit animal fats in diet for cholesterol and heart health - choose grass fed whenever available   Avoid highly processed foods, and foods high in saturated/trans fats   Aim for low stress - take time to unwind and care for your mental health   Aim for 150 min of moderate intensity exercise weekly for heart health, and weights twice weekly for bone health   Aim for 7-9 hours of sleep daily   When it comes to diets, agreement about the perfect plan isnt easy to find, even among the experts. Experts at the [place] developed an idea known as the Healthy Eating Plate. Just imagine a plate divided into logical, healthy portions.   The emphasis is on diet quality:   Load up on vegetables and fruits - one-half of your plate: Aim for color and variety, and remember that potatoes dont count.   Go for whole grains - one-quarter of your plate: Whole wheat, barley, wheat berries, quinoa, oats, brown rice, and foods made with them. If you want pasta, go with whole wheat pasta.   Protein power - one-quarter of your plate: Fish, chicken, beans, and nuts are all healthy, versatile protein sources. Limit red meat.   The diet, however, does go beyond the plate, offering a few other suggestions.   Use healthy plant oils, such as olive, canola, soy, corn, sunflower and peanut. Check the labels, and avoid partially hydrogenated oil, which have unhealthy trans fats.   If youre thirsty, drink water.  Coffee and tea are good in moderation, but skip sugary drinks and limit milk and dairy products to one or two daily servings.   The type of carbohydrate in the diet is more important than the amount. Some sources of carbohydrates, such as vegetables, fruits, whole grains, and beans-are healthier than others.   Finally, stay active  Signed, [name], DO  01/31/2020 11:48 PM    [facility]

## 2020-03-02 ENCOUNTER — Telehealth: Payer: Self-pay | Admitting: Cardiology

## 2020-03-02 NOTE — Telephone Encounter (Signed)
Wife of the patient called. The pt was recently in the hospital and had some changes in his medications. The wife would like to talk to Dr. Servando Salina or her Nurse to discuss those medication changes

## 2020-03-02 NOTE — Telephone Encounter (Signed)
Spoke with patients wife just now and she let me know that the patient was instructed by the Hospital to be taking Plavix 75 mg daily for the next 30 days. Then after this the patient is going to be transitioned to Aspirin 325 mg daily. She just wanted to make Dr. Servando Salina aware of this and she wanted to make sure that Dr. Servando Salina felt that this was ok for the patient to do.

## 2020-03-03 MED ORDER — LEVOTHYROXINE SODIUM 112 MCG PO TABS
112.00 | ORAL_TABLET | ORAL | Status: DC
Start: 2020-03-03 — End: 2020-03-03

## 2020-03-03 MED ORDER — ATORVASTATIN CALCIUM 40 MG PO TABS
80.00 | ORAL_TABLET | ORAL | Status: DC
Start: 2020-03-02 — End: 2020-03-03

## 2020-03-03 MED ORDER — ACETAMINOPHEN 325 MG PO TABS
650.00 | ORAL_TABLET | ORAL | Status: DC
Start: ? — End: 2020-03-03

## 2020-03-03 MED ORDER — ASPIRIN 325 MG PO TBEC
325.00 | DELAYED_RELEASE_TABLET | ORAL | Status: DC
Start: 2020-03-03 — End: 2020-03-03

## 2020-03-03 MED ORDER — LOSARTAN POTASSIUM 25 MG PO TABS
25.00 | ORAL_TABLET | ORAL | Status: DC
Start: 2020-03-03 — End: 2020-03-03

## 2020-03-03 NOTE — Telephone Encounter (Signed)
That is fine. He was seen recently with retinal artery occlusion. Would defer any changes on Plavix/Aspirin to Neurology. He should have appointment upcoming. Appreciate him making [place] aware. If he has stomach upset with the aspirin, may use Omeprazole (Prilosec) as needed.  Alver Sorrow, NP

## 2020-03-03 NOTE — Telephone Encounter (Signed)
Spoke to patients wife just now and let her know Dan Humphreys, NP's recommendations. She verbalizes understanding and states she will let us know if the patient has any symptoms or needs any Prilosec.    Encouraged patient/Wife to call back with any questions or concerns.

## 2020-04-02 DIAGNOSIS — I10 Essential (primary) hypertension: Secondary | ICD-10-CM | POA: Diagnosis present

## 2020-04-02 DIAGNOSIS — H3411 Central retinal artery occlusion, right eye: Secondary | ICD-10-CM | POA: Insufficient documentation

## 2020-04-02 HISTORY — DX: Central retinal artery occlusion, right eye: H34.11

## 2020-04-27 ENCOUNTER — Other Ambulatory Visit: Payer: Self-pay

## 2020-04-27 ENCOUNTER — Encounter: Payer: Self-pay | Admitting: Cardiology

## 2020-04-27 ENCOUNTER — Ambulatory Visit (INDEPENDENT_AMBULATORY_CARE_PROVIDER_SITE_OTHER): Payer: Medicare Other | Admitting: Cardiology

## 2020-04-27 ENCOUNTER — Encounter (INDEPENDENT_AMBULATORY_CARE_PROVIDER_SITE_OTHER): Payer: Self-pay

## 2020-04-27 ENCOUNTER — Other Ambulatory Visit (INDEPENDENT_AMBULATORY_CARE_PROVIDER_SITE_OTHER): Payer: Medicare Other

## 2020-04-27 VITALS — BP 128/80 | HR 58 | Ht 65.0 in | Wt 144.2 lb

## 2020-04-27 DIAGNOSIS — Z952 Presence of prosthetic heart valve: Secondary | ICD-10-CM

## 2020-04-27 DIAGNOSIS — Q211 Atrial septal defect: Secondary | ICD-10-CM | POA: Diagnosis not present

## 2020-04-27 DIAGNOSIS — I6389 Other cerebral infarction: Secondary | ICD-10-CM | POA: Diagnosis not present

## 2020-04-27 DIAGNOSIS — I25119 Atherosclerotic heart disease of native coronary artery with unspecified angina pectoris: Secondary | ICD-10-CM | POA: Diagnosis not present

## 2020-04-27 DIAGNOSIS — I639 Cerebral infarction, unspecified: Secondary | ICD-10-CM

## 2020-04-27 DIAGNOSIS — Q2112 Patent foramen ovale: Secondary | ICD-10-CM

## 2020-04-27 DIAGNOSIS — Z951 Presence of aortocoronary bypass graft: Secondary | ICD-10-CM

## 2020-04-27 MED ORDER — ISOSORBIDE MONONITRATE ER 30 MG PO TB24
15.0000 mg | ORAL_TABLET | Freq: Every day | ORAL | 3 refills | Status: DC
Start: 2020-04-27 — End: 2020-05-18

## 2020-04-27 NOTE — Progress Notes (Signed)
Cardiology Office Note:    Date:  04/27/2020   ID:  Dennis Gomez, DOB [birth year], MRN [id number]  PCP:  Dennis Malta, Dennis Gomez  Cardiologist:  Dennis Ripple, DO  Electrophysiologist:  None   Referring Dennis Gomez: Dennis Malta, Dennis Gomez   " I had a stroke recently, My right eye is blurry"  History of Present Illness:    Dennis Gomez a 69 y.o.malewith a hx of COPD, tobacco abuse(quit), hypothroidism,HTN, acute CHF/LV dysfunction (EF now 60-65% 08/2019- improvement from 35-40%), moderate-severe aortic regurgitation s/p AVRwith inspiris 33mm bioprosthesis, CAD s/p CABGX4presents for follow up.  I last saw the patient January 31, 2020 at that time he appeared to be stable from a cardiovascular standpoint he had resolved his nosebleed.  We will continue his medication including aspirin statin and recommended follow-up in 6 months.  In the interim   Per records on May 14 he was doing work when he suddenly experienced loss of vision in his right eye.  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  The etiology was unknown but there was concern about cryptogenic stroke as well.  He was discharged on aspirin and Plavix for 30 days at which time he was asked to stop the aspirin continue Plavix.  He also kept on his Lipitor to milligrams daily.  He had multiple tests including MRI of the brain, CT scan of the brain and neck well as echocardiogram as noted below.  Since his discharge she has seen ophthalmology, neurology, neurosurgery as well as vascular surgery.  He was asked to see cardiology due to the fact that he did have his agitated saline test suggestive of right to left shunt.  Past Medical History:  Diagnosis Date  . Acute kidney injury (HCC) 05/17/2019  . Aortic regurgitation   . Chest pain 05/16/2019  . Chronic combined systolic and diastolic CHF (congestive heart  failure) (HCC)    a. LVEF 35-40% at [facility] in 04/2019, 45-50% at [facility] pre-AVR. 25-30% by repeat echo 05/2019.  [name] Chronic kidney disease (CKD), stage III (moderate)   . Chronic systolic congestive heart failure (HCC)   . COPD (chronic obstructive pulmonary disease) (HCC)   . Coronary artery disease    a. s/p CABGx4 in 05/2019.  [name] Essential hypertension   . Hyperlipidemia LDL goal <70   . Hypothyroid   . Hypothyroidism 05/16/2019  . PAD (peripheral artery disease) (HCC)    a. R/L ABIs indicated mild lower extremity arterial disease and carotid duplex showed 1-39% stenosis.  [name] Postoperative atrial fibrillation (HCC)   . S/P aortic valve replacement with bioprosthetic valve 05/21/2019   25 mm Edwards Inspiris Resilia stented bovine pericardial tissue valve  . 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 Severe aortic regurgitation   . Syncope 06/17/2019  . Tobacco abuse     Past Surgical History:  Procedure Laterality Date  . AORTIC VALVE REPLACEMENT N/A 05/21/2019   Procedure: AORTIC VALVE REPLACEMENT (AVR) using Inspiris Valve size 62mm;  Surgeon: Dennis Nails, Dennis Gomez;  Location: [facility] OR;  Service: Open Heart Surgery;  Laterality: N/A;  . CORONARY ARTERY BYPASS GRAFT N/A 05/21/2019   Procedure: CORONARY ARTERY BYPASS GRAFTING (CABG) x Four , using left internal mammary artery and right leg greater saphenous vein harvested endoscopically;  Surgeon: Dennis Stalker  Gomez, Dennis Gomez;  Location: [facility] OR;  Service: Open Heart Surgery;  Laterality: N/A;  . RIGHT/LEFT HEART CATH AND CORONARY ANGIOGRAPHY N/A 05/17/2019   Procedure: RIGHT/LEFT HEART CATH AND CORONARY ANGIOGRAPHY;  Surgeon: Dennis Bollman, Dennis Gomez;  Location: [facility] INVASIVE CV LAB;  Service: Cardiovascular;  Laterality: N/A;  . TEE WITHOUT CARDIOVERSION N/A 05/21/2019   Procedure: TRANSESOPHAGEAL ECHOCARDIOGRAM (TEE);  Surgeon: Dennis Nails, Dennis Gomez;  Location: [facility] OR;  Service: Open Heart Surgery;   Laterality: N/A;    Current Medications: Current Meds  Medication Sig  . atorvastatin (LIPITOR) 80 MG tablet Take 1 tablet (80 mg total) by mouth daily at 6 PM.  . clopidogrel (PLAVIX) 75 MG tablet Take 1 tablet by mouth daily.  [name] levothyroxine (SYNTHROID) 112 MCG tablet TAKE 1 TABLET DAILY IN THE MORNING ON AN EMPTY STOMACH FOR HYPOTHYROIDISM  . losartan (COZAAR) 25 MG tablet Take 1 tablet (25 mg total) by mouth daily.     Allergies:   Hydrocodone   Social History   Socioeconomic History  . Marital status: Married    Spouse name: Not on file  . Number of children: Not on file  . Years of education: Not on file  . Highest education level: Not on file  Occupational History  . Not on file  Tobacco Use  . Smoking status: Former Smoker    Types: Cigarettes    Quit date: 05/17/2019    Years since quitting: 0.9  . Smokeless tobacco: Never Used  Substance and Sexual Activity  . Alcohol use: Not Currently  . Drug use: Not on file  . Sexual activity: Not on file  Other Topics Concern  . Not on file  Social History Narrative  . Not on file   Social Determinants of Health   Financial Resource Strain:   . Difficulty of Paying Living Expenses:   Food Insecurity:   . Worried About Programme researcher, broadcasting/film/video in the Last Year:   . Barista in the Last Year:   Transportation Needs:   . Freight forwarder (Medical):   [name] Lack of Transportation (Non-Medical):   Physical Activity:   . Days of Exercise per Week:   . Minutes of Exercise per Session:   Stress:   . Feeling of Stress :   Social Connections:   . Frequency of Communication with Friends and Family:   . Frequency of Social Gatherings with Friends and Family:   . Attends Religious Services:   . Active Member of Clubs or Organizations:   . Attends Banker Meetings:   [name] Marital Status:      Family History: The patient's family history includes COPD in his father; Cancer in his mother.  ROS:   Review  of Systems  Constitution: Negative for decreased appetite, fever and weight gain.  HENT: Negative for congestion, ear discharge, hoarse voice and sore throat.   Eyes: Negative for discharge, redness, vision loss in right eye and visual halos.  Cardiovascular: Negative for chest pain, dyspnea on exertion, leg swelling, orthopnea and palpitations.  Respiratory: Negative for cough, hemoptysis, shortness of breath and snoring.   Endocrine: Negative for heat intolerance and polyphagia.  Hematologic/Lymphatic: Negative for bleeding problem. Does not bruise/bleed easily.  Skin: Negative for flushing, nail changes, rash and suspicious lesions.  Musculoskeletal: Negative for arthritis, joint pain, muscle cramps, myalgias, neck pain and stiffness.  Gastrointestinal: Negative for abdominal pain, bowel incontinence, diarrhea and excessive appetite.  Genitourinary: Negative for decreased libido, genital sores and  incomplete emptying.  Neurological: Negative for brief paralysis, focal weakness, headaches and loss of balance.  Psychiatric/Behavioral: Negative for altered mental status, depression and suicidal ideas.  Allergic/Immunologic: Negative for HIV exposure and persistent infections.    EKGs/Labs/Other Studies Reviewed:    The following studies were reviewed today:   EKG:  The ekg ordered today demonstrates   Testing in care everywhere from [facility] hospital Stroke Work Up  Results for orders placed or performed during the hospital encounter of 03/01/20  MR BRAIN WO CONTRAST (Neurology FLAIR Protocol)  Narrative  MRI BRAIN WITHOUT CONTRAST (NEUROLOGY FLAIR PROTOCOL), 03/01/2020 11:25 PM  INDICATION: \ H54.61 Vision loss of right eye   COMPARISON: Head CT from earlier the same day.  TECHNIQUE: Multiplanar, multi-sequence magnetic resonance imaging of the brain was performed without contrast.  FINDINGS: Calvarium/skull base: No focal marrow replacing lesion. Small bilateral mastoid effusions.   Orbits: No focal mass. Paranasal sinuses: Small amount of fluid within a right ethmoid air cell. Brain: No restricted diffusion. No mass effect. No evidence of acute hemorrhage. No hydrocephalus. Remote lacunar infarct in the left posterior frontal centrum semiovale. Generalized parenchymal volume loss. Scattered T2 and FLAIR signal abnormality in the periventricular and subcortical white matter, likely reflecting sequelae of chronic microvascular ischemic disease (leukoaraiosis). Few scattered foci of susceptibility artifact within the cerebral hemispheres likely reflecting sequelae of prior microhemorrhages. Please see same-day CTA for evaluation of the vasculature.   Impression  No acute intracranial abnormality.  CT ANGIOGRAPHY NECK AND HEAD  Narrative  CT BRAIN WITH AND WITHOUT CONTRAST, CT ANGIOGRAPHY OF NECK AND HEAD, 03/01/2020 7:09 PM  INDICATION: Carotid or vertebral dissection suspected \ H54.61 Vision loss of right eye  COMPARISON: None.  TECHNIQUE: Scout and pre-contrast axial images were first obtained, followed by a small test bolus of contrast for CTA timing purposes. Thereafter, a full dose of iodinated contrast was administered intravenously by rapid injection, with further multi-slice axial sections acquired in the arterial phase from the aortic arch to the cranial vertex. Image post-processing was then performed using 3D techniques (for example, MIP or 3D surface-rendered) to create reformatted images for comprehensive analysis and diagnosis of the intracranial circulation including the Circle of Willis. Any stenosis reported is calculated using the estimated diameter of the distal normal vessel (e.g., ICA) in the denominator.  All CT scans at [facility] and [facility] are performed using dose optimization techniques as appropriate to a performed exam, including but not limited to one or more of the following: automated exposure control,  adjustment of the mA and/or kV according to patient size, use of iterative reconstruction technique. In addition, [facility] is participating in the ACR Dose Registry program which will further assist [place] in optimizing patient radiation exposure.  CTA NECK Aortic arch: There are 2 major branch vessels from the aortic arch with a common origin of the brachiocephalic and left common carotid artery, anatomic variant. Moderate stenosis of the right subclavian artery distal to the origin of the right vertebral artery (series 6, image 120). Mild stenosis within the proximal left subclavian artery due to predominantly noncalcified atherosclerotic disease. Left carotid system: No evidence of significant (50% or greater) stenosis or occlusion of the common carotid or internal carotid arteries. High-grade stenosis at the origin of the external carotid artery due to noncalcified atherosclerotic disease. Right carotid system: Ulcerative plaque at the origin of the right internal carotid artery (series 6, image 232) protruding into the vessel lumen. No evidence of  significant (50% or greater) stenosis or occlusion. Vertebral arteries: Codominant. No evidence of significant (50% or greater) stenosis or occlusion. Mild scattered calcific atherosclerotic plaque along the left vertebral artery without high-grade stenosis.  Additional comments of the neck: Advanced emphysema within the imaged upper lungs.  CT/CTA BRAIN Calvarium/skull base: No evidence of acute fracture or destructive lesion. Mastoids and middle ears demonstrate no substantial disease.  Paranasal sinuses: No air-fluid levels or substantial mucosal disease. Brain: No evidence of acute large vascular territory infarct. Patchy areas of periventricular and subcortical white matter hypoattenuation, nonspecific but commonly seen with changes of small vessel disease. No mass effect, mass lesion, or hydrocephalus. No acute hemorrhage. Anterior circulation: No  occlusion. Moderate stenosis of the right cavernous internal carotid artery (series 6, image 361). No significant stenosis of the left anterior circulation. Inferomedially directed outpouching arising from the expected origin of the right posterior communicating artery measuring 3 x 3 mm (series 6, image 376).  Vertebrobasilar system: No evidence of aneurysm, significant stenosis, or occlusion.  Impression  1. No acute intracranial hemorrhage.  2. No findings specific for acute territorial ischemia.  3. No large vessel intracranial occlusion.  4. Ulcerative plaque at the origin of the right internal carotid artery protruding into the vessel lumen, which increases risk for thromboembolic events. 5. Moderate stenosis of the right cavernous internal carotid artery. 6. Inferomedially-directed outpouching measuring 3 mm arising from the communicating segment of the right internal carotid artery, which may represent either an infundibulum of an atretic right posterior communicating artery versus small aneurysm.  Final report and findings were discussed with Dennis Render, NP by Dr. Vladimir Gomez via telephone on 03/02/2020 at 12:05 PM.   CUS TTE SURFACE COMPLETE ECHO ADULT  Result Value Ref Range  Left Ventricular EF 65 %  Narrative  [facility] [place], [place]. [address] Transthoracic Echocardiogram Report Name: Dennis Gomez, Dennis Gomez Study Date: 03/02/2020  Height: 64 in MRN: [id number] Patient Location: B094  Weight: 140 lb DOB: Sep 17, 1951 Gender: Male  BSA: 1.7 m2 Age: 69 yrs Ethnicity: W  BP: 126/71 mmHg Reason For Study: Stroke; Vision loss of right eye  HR: 70 Ordering Physician: Dennis Gomez, Dennis Gomez Performed By: Dennis Gomez  Referring Physician: SYSTEM, PROV NOT IN PROCEDURE Image Quality: Good. Injection of agitated saline contrast performed to evaluate for possible shunt. - SUMMARY Injection of agitated saline contrast performed  to evaluate for possible shunt There is mild concentric left ventricular hypertrophy. The left ventricular cavity is small. Left ventricular systolic function is normal. LV ejection fraction = 65-70%. Left ventricular filling pattern is prolonged relaxation. No segmental wall motion abnormalities seen in the left ventricle The right ventricle is normal in size and function. The left atrium is borderline dilated. Injection of agitated saline showed mild right-to-left shunt. There is a bioprosthetic aortic valve. The prosthetic aortic valve is well-seated with normal function The IVC is normal in size with an inspiratory collapse of greater then 50%, suggesting normal right atrial pressure. There is no pericardial effusion. There is no comparison study available.  FINDINGS: LEFT VENTRICLE There is mild concentric left ventricular hypertrophy. The left ventricular cavity is small. Left ventricular systolic function is normal. LV ejection fraction = 65-70%. Left ventricular filling pattern is prolonged relaxation. No segmental wall motion abnormalities seen in the left ventricle.  RIGHT VENTRICLE The right ventricle is normal in size and function.  LEFT ATRIUM The left atrium is borderline dilated.  RIGHT ATRIUM Right atrial size is normal.  Injection of agitated saline showed right-to-left interatrial shunt. Injection of agitated saline showed mild right-to-left shunt. - AORTIC VALVE There is trace aortic regurgitation. There is a bioprosthetic aortic valve. The prosthetic aortic valve is well-seated with normal function. - MITRAL VALVE There is mild mitral valve thickening. There is mild mitral annular calcification. There is trace mitral regurgitation. - TRICUSPID VALVE The tricuspid valve is normal in structure and function. There is trace tricuspid regurgitation. There was insufficient TR detected to calculate RV systolic pressure. - PULMONIC VALVE The pulmonic  valve is not well visualized. There is no significant pulmonic regurgitation. - ARTERIES The aortic sinus is normal size. - VENOUS Pulmonary venous flow pattern is normal. The IVC is normal in size with an inspiratory collapse of greater then 50%, suggesting normal right atrial pressure. - EFFUSION There is no pericardial effusion. - -  MMode/2D Measurements & Calculations IVSd: 1.2 cm LA diam: 2.3 cm ESV(MOD-sp4): Ao sinus diam: LVIDd: 3.6 cm EDV(MOD-sp4): 28.2 ml 2.8 cm LVPWd: 1.2 cm 80.7 ml LVIDs: 2.9 cm  _______________________________________________________________________ LVOT diam: SV(MOD-sp4): IVC 1: 1.4 cm LA area A2: 2.0 cm 52.5 ml 21.5 cm2 SI(MOD-sp4): 31.3 ml/m2  _______________________________________________________________________ LA area A4: LA vol: 59.3 ml LA vol index: RA area A4: 14.5 cm2 35.3 ml/m2 12.6 cm2  Doppler Measurements & Calculations MV E max vel: MV dec time: SV(LVOT): LV V1 VTI: 88.7 cm/sec 0.31 sec 107.7 ml 33.2 cm MV A max vel: Ao V2 max: 89.1 cm/sec 251.4 cm/sec MV E/A: 1.00 Ao max PG: Med Peak E' Vel: 25.3 mmHg 7.1 cm/sec Ao V2 mean: Lat Peak E' Vel: 142.2 cm/sec 9.3 cm/sec Ao mean PG: E/Lat E`: 9.5 10.1 mmHg E/Med E`: 12.5 Ao V2 VTI: 41.0 cm AVA (VTI):  2.6 cm2  _______________________________________________________________________ RAP systole: AS Dimensionless AVAi(VTI) SV index(LVOT): 3.0 mmHg Index (VTI): 0.81 cm^2/m^2: 1.6 cm264.1 ml/m2  _______________________________________________________________________ Reading Physician: Dennis Riley, Dennis Gomez, Dennis Gomez 03/02/2020 09:49 AM    Telemetry  No evidence of Atrial fibrillation RISK STRATIFICATION LABS listed below Lab Results  Component Value Date  CHOL 109 03/01/2020  TRIG 72 03/01/2020  HDL 30 (L) 03/01/2020  LDL 57 03/01/2020    Component Value Date/Time  HBA1C 5.5 03/01/2020 2028    Recent Labs: 05/18/2019: ALT 11 06/18/2019: TSH 15.100 07/31/2019: BUN 21;  Creatinine, Ser 0.99; Magnesium 1.9; Potassium 5.1; Sodium 144 11/27/2019: Hemoglobin 12.6; Platelets 197  Recent Lipid Panel    Component Value Date/Time   CHOL 109 07/31/2019 0855   TRIG 80 07/31/2019 0855   HDL 33 (L) 07/31/2019 0855   CHOLHDL 3.3 07/31/2019 0855   LDLCALC 60 07/31/2019 0855    Physical Exam:    VS:  BP 128/80 (BP Location: Left Arm, Patient Position: Sitting, Cuff Size: Normal)   Pulse (!) 58   Ht  (1.651 m)   Wt 144 lb 3.2 oz (65.4 kg)   SpO2 94%   BMI 24.00 kg/m     Wt Readings from Last 3 Encounters:  04/27/20 144 lb 3.2 oz (65.4 kg)  01/31/20 143 lb (64.9 kg)  12/31/19 141 lb (64 kg)     GEN: Well nourished, well developed in no acute distress HEENT: Normal NECK: No JVD; No carotid bruits LYMPHATICS: No lymphadenopathy CARDIAC: S1S2 noted,RRR, no murmurs, rubs, gallops RESPIRATORY:  Clear to auscultation without rales, wheezing or rhonchi  ABDOMEN: Soft, non-tender, non-distended, +bowel sounds, no guarding. EXTREMITIES: No edema, No cyanosis, no clubbing MUSCULOSKELETAL:  No deformity  SKIN: Warm and dry NEUROLOGIC:  Alert and oriented x 3, non-focal PSYCHIATRIC:  Normal affect, good insight  ASSESSMENT:    1. PFO with atrial septal aneurysm   2. Cryptogenic stroke (HCC)   3. Coronary artery disease involving native coronary artery of native heart with angina pectoris (HCC)   4. Other cerebral infarction (HCC)   5. S/P CABG x 4   6. S/P AVR    PLAN:     He appears to be improving clinically.  However with his recent testing suggesting right to left shunt system with a PFO.  I like to repeat a limited bubble study for confirmation prior to considering any basic testing such as TEE.  In the meantime prescriptive genic stroke is overall suspicion that this may have been secondary to the carotid web which usually stenting is being planned by neurosurgery.  For completeness I will place a monitor on the patient for 7 days to rule out any  arrhythmia including atrial fibrillation at this time.  Coronary artery disease-aspirin has been stopped he is only on Plavix.  Continue his Lipitor as well.  Imdur 50 mg daily for concern of angina.  Status post AVR continue the Plavix for now.  He plans to see CT surgery in the upcoming weeks.  Cryptogenic stroke-he follows with nephrology.  Current management with Plavix as well as Lipitor.  We will get a 14-day ZIO monitor, repeat bubble study.  Further recommendation will be made based on results from testing as reported.  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  The patient is in agreement with the above plan. The patient left the office in stable condition.  The patient will follow up in   Medication Adjustments/Labs and Tests Ordered: Current medicines are reviewed at length with the patient today.  Concerns regarding medicines are outlined above.  Orders Placed This Encounter  Procedures  . LONG TERM MONITOR (3-14 DAYS)  . EKG 12-Lead  . ECHOCARDIOGRAM LIMITED BUBBLE STUDY   Meds ordered this encounter  Medications  . isosorbide mononitrate (IMDUR) 30 MG 24 hr tablet    Sig: Take 0.5 tablets (15 mg total) by mouth daily.    Dispense:  15 tablet    Refill:  3    Patient Instructions  Medication Instructions:  Your physician has recommended you make the following change in your medication:   Start Imdur 15 mg daily.  *If you need a refill on your cardiac medications before your next appointment, please call your pharmacy*   Lab Work: None ordered If you have labs (blood work) drawn today and your tests are completely normal, you will receive your results only by: [name] MyChart Message (if you have MyChart) OR . A paper copy in the mail If you have any lab test that is abnormal or we need to change your treatment, we will call you to review the results.   Testing/Procedures:  Your physician has requested that you have  an echocardiogram. Echocardiography is a painless test that uses sound waves to create images of your heart. It provides your doctor with information about the size and shape of your heart and how well your heart's chambers and valves are working. This procedure takes approximately one hour. There are no restrictions for this procedure.    WHY IS MY DOCTOR PRESCRIBING ZIO? The Zio system is proven and trusted by physicians to detect and diagnose irregular heart rhythms -- and has been prescribed to hundreds of thousands of patients.  The FDA  has cleared the Zio system to monitor for many different kinds of irregular heart rhythms. In a study, physicians were able to reach a diagnosis 90% of the time with the Zio system1.  You can wear the Zio monitor -- a small, discreet, comfortable patch -- during your normal day-to-day activity, including while you sleep, shower, and exercise, while it records every single heartbeat for analysis.  1Barrett, P., et al. Comparison of 24 Hour Holter Monitoring Versus 14 Day Novel Adhesive Patch Electrocardiographic Monitoring. American Journal of Medicine, 2014.  ZIO VS. HOLTER MONITORING The Zio monitor can be comfortably worn for up to 14 days. Holter monitors can be worn for 24 to 48 hours, limiting the time to record any irregular heart rhythms you may have. Zio is able to capture data for the 51% of patients who have their first symptom-triggered arrhythmia after 48 hours.1  LIVE WITHOUT RESTRICTIONS The Zio ambulatory cardiac monitor is a small, unobtrusive, and water-resistant patch--you might even forget you're wearing it. The Zio monitor records and stores every beat of your heart, whether you're sleeping, working out, or showering.  Wear the monitor for 2 weeks, remove 05/11/20.   Follow-Up: At [facility], you and your health needs are our priority.  As part of our continuing mission to provide you with exceptional heart care, we have created  designated Provider Care Teams.  These Care Teams include your primary Cardiologist (physician) and Advanced Practice Providers (APPs -  Physician Assistants and Nurse Practitioners) who all work together to provide you with the care you need, when you need it.  We recommend signing up for the patient portal called "MyChart".  Sign up information is provided on this After Visit Summary.  MyChart is used to connect with patients for Virtual Visits (Telemedicine).  Patients are able to view lab/test results, encounter notes, upcoming appointments, etc.  Non-urgent messages can be sent to your provider as well.   To learn more about what you can do with MyChart, go to [email or web address].    Your next appointment:   8 week(s)  The format for your next appointment:   In Person  Provider:   [name], DO   Other Instructions Isosorbide Mononitrate extended-release tablets What is this medicine? ISOSORBIDE MONONITRATE (eye soe SOR bide mon oh NYE trate) is a vasodilator. It relaxes blood vessels, increasing the blood and oxygen supply to your heart. This medicine is used to prevent chest pain caused by angina. It will not help to stop an episode of chest pain. This medicine may be used for other purposes; ask your health care provider or pharmacist if you have questions. COMMON BRAND NAME(S): Imdur, Isotrate ER What should I tell my health care provider before I take this medicine? They need to know if you have any of these conditions:  previous heart attack or heart failure  an unusual or allergic reaction to isosorbide mononitrate, nitrates, other medicines, foods, dyes, or preservatives  pregnant or trying to get pregnant  breast-feeding How should I use this medicine? Take this medicine by mouth with a glass of water. Follow the directions on the prescription label. Do not crush or chew. Take your medicine at regular intervals. Do not take your medicine more often than directed.  Do not stop taking this medicine except on the advice of your doctor or health care professional. Talk to your pediatrician regarding the use of this medicine in children. Special care may be needed. Overdosage: If you think you have taken too  much of this medicine contact a poison control center or emergency room at once. NOTE: This medicine is only for you. Do not share this medicine with others. What if I miss a dose? If you miss a dose, take it as soon as you can. If it is almost time for your next dose, take only that dose. Do not take double or extra doses. What may interact with this medicine? Do not take this medicine with any of the following medications:  medicines used to treat erectile dysfunction (ED) like avanafil, sildenafil, tadalafil, and vardenafil  riociguat This medicine may also interact with the following medications:  medicines for high blood pressure  other medicines for angina or heart failure This list may not describe all possible interactions. Give your health care provider a list of all the medicines, herbs, non-prescription drugs, or dietary supplements you use. Also tell them if you smoke, drink alcohol, or use illegal drugs. Some items may interact with your medicine. What should I watch for while using this medicine? Check your heart rate and blood pressure regularly while you are taking this medicine. Ask your doctor or health care professional what your heart rate and blood pressure should be and when you should contact him or her. Tell your doctor or health care professional if you feel your medicine is no longer working. You may get dizzy. Do not drive, use machinery, or do anything that needs mental alertness until you know how this medicine affects you. To reduce the risk of dizzy or fainting spells, do not sit or stand up quickly, especially if you are an older patient. Alcohol can make you more dizzy, and increase flushing and rapid heartbeats. Avoid  alcoholic drinks. Do not treat yourself for coughs, colds, or pain while you are taking this medicine without asking your doctor or health care professional for advice. Some ingredients may increase your blood pressure. What side effects may I notice from receiving this medicine? Side effects that you should report to your doctor or health care professional as soon as possible:  bluish discoloration of lips, fingernails, or palms of hands  irregular heartbeat, palpitations  low blood pressure  nausea, vomiting  persistent headache  unusually weak or tired Side effects that usually do not require medical attention (report to your doctor or health care professional if they continue or are bothersome):  flushing of the face or neck  rash This list may not describe all possible side effects. Call your doctor for medical advice about side effects. You may report side effects to FDA at 1-800-FDA-1088. Where should I keep my medicine? Keep out of the reach of children. Store between 15 and 30 degrees C (59 and 86 degrees F). Keep container tightly closed. Throw away any unused medicine after the expiration date. NOTE: This sheet is a summary. It may not cover all possible information. If you have questions about this medicine, talk to your doctor, pharmacist, or health care provider.  2020 Elsevier/Gold Standard (2013-08-02 14:48:19)     Adopting a Healthy Lifestyle.  Know what a healthy weight is for you (roughly BMI <25) and aim to maintain this   Aim for 7+ servings of fruits and vegetables daily   65-80+ fluid ounces of water or unsweet tea for healthy kidneys   Limit to max 1 drink of alcohol per day; avoid smoking/tobacco   Limit animal fats in diet for cholesterol and heart health - choose grass fed whenever available   Avoid highly processed foods,  and foods high in saturated/trans fats   Aim for low stress - take time to unwind and care for your mental health   Aim  for 150 min of moderate intensity exercise weekly for heart health, and weights twice weekly for bone health   Aim for 7-9 hours of sleep daily   When it comes to diets, agreement about the perfect plan isnt easy to find, even among the experts. Experts at the [facility] [employer] developed an idea known as the Healthy Eating Plate. Just imagine a plate divided into logical, healthy portions.   The emphasis is on diet quality:   Load up on vegetables and fruits - one-half of your plate: Aim for color and variety, and remember that potatoes dont count.   Go for whole grains - one-quarter of your plate: Whole wheat, barley, wheat berries, quinoa, oats, brown rice, and foods made with them. If you want pasta, go with whole wheat pasta.   Protein power - one-quarter of your plate: Fish, chicken, beans, and nuts are all healthy, versatile protein sources. Limit red meat.   The diet, however, does go beyond the plate, offering a few other suggestions.   Use healthy plant oils, such as olive, canola, soy, corn, sunflower and peanut. Check the labels, and avoid partially hydrogenated oil, which have unhealthy trans fats.   If youre thirsty, drink water. Coffee and tea are good in moderation, but skip sugary drinks and limit milk and dairy products to one or two daily servings.   The type of carbohydrate in the diet is more important than the amount. Some sources of carbohydrates, such as vegetables, fruits, whole grains, and beans-are healthier than others.   Finally, stay active  Signed, Dennis Ripple, DO  04/27/2020 3:41 PM    [facility]

## 2020-04-27 NOTE — Patient Instructions (Addendum)
Medication Instructions:  Your physician has recommended you make the following change in your medication:   Start Imdur 15 mg daily.  *If you need a refill on your cardiac medications before your next appointment, please call your pharmacy*   Lab Work: None ordered If you have labs (blood work) drawn today and your tests are completely normal, you will receive your results only by: [name] MyChart Message (if you have MyChart) OR . A paper copy in the mail If you have any lab test that is abnormal or we need to change your treatment, we will call you to review the results.   Testing/Procedures:  Your physician has requested that you have an echocardiogram. Echocardiography is a painless test that uses sound waves to create images of your heart. It provides your doctor with information about the size and shape of your heart and how well your heart's chambers and valves are working. This procedure takes approximately one hour. There are no restrictions for this procedure.    WHY IS MY DOCTOR PRESCRIBING ZIO? The Zio system is proven and trusted by physicians to detect and diagnose irregular heart rhythms -- and has been prescribed to hundreds of thousands of patients.  The FDA has cleared the Zio system to monitor for many different kinds of irregular heart rhythms. In a study, physicians were able to reach a diagnosis 90% of the time with the Zio system1.  You can wear the Zio monitor -- a small, discreet, comfortable patch -- during your normal day-to-day activity, including while you sleep, shower, and exercise, while it records every single heartbeat for analysis.  1Barrett, P., et al. Comparison of 24 Hour Holter Monitoring Versus 14 Day Novel Adhesive Patch Electrocardiographic Monitoring. American Journal of Medicine, 2014.  ZIO VS. HOLTER MONITORING The Zio monitor can be comfortably worn for up to 14 days. Holter monitors can be worn for 24 to 48 hours, limiting the time to record  any irregular heart rhythms you may have. Zio is able to capture data for the 51% of patients who have their first symptom-triggered arrhythmia after 48 hours.1  LIVE WITHOUT RESTRICTIONS The Zio ambulatory cardiac monitor is a small, unobtrusive, and water-resistant patch--you might even forget you're wearing it. The Zio monitor records and stores every beat of your heart, whether you're sleeping, working out, or showering.  Wear the monitor for 2 weeks, remove 05/11/20.   Follow-Up: At [facility], you and your health needs are our priority.  As part of our continuing mission to provide you with exceptional heart care, we have created designated Provider Care Teams.  These Care Teams include your primary Cardiologist (physician) and Advanced Practice Providers (APPs -  Physician Assistants and Nurse Practitioners) who all work together to provide you with the care you need, when you need it.  We recommend signing up for the patient portal called "MyChart".  Sign up information is provided on this After Visit Summary.  MyChart is used to connect with patients for Virtual Visits (Telemedicine).  Patients are able to view lab/test results, encounter notes, upcoming appointments, etc.  Non-urgent messages can be sent to your provider as well.   To learn more about what you can do with MyChart, go to [email or web address].    Your next appointment:   8 week(s)  The format for your next appointment:   In Person  Provider:   [name], DO   Other Instructions Isosorbide Mononitrate extended-release tablets What is this medicine? ISOSORBIDE MONONITRATE (eye soe SOR  bide mon oh NYE trate) is a vasodilator. It relaxes blood vessels, increasing the blood and oxygen supply to your heart. This medicine is used to prevent chest pain caused by angina. It will not help to stop an episode of chest pain. This medicine may be used for other purposes; ask your health care provider or pharmacist if  you have questions. COMMON BRAND NAME(S): Imdur, Isotrate ER What should I tell my health care provider before I take this medicine? They need to know if you have any of these conditions:  previous heart attack or heart failure  an unusual or allergic reaction to isosorbide mononitrate, nitrates, other medicines, foods, dyes, or preservatives  pregnant or trying to get pregnant  breast-feeding How should I use this medicine? Take this medicine by mouth with a glass of water. Follow the directions on the prescription label. Do not crush or chew. Take your medicine at regular intervals. Do not take your medicine more often than directed. Do not stop taking this medicine except on the advice of your doctor or health care professional. Talk to your pediatrician regarding the use of this medicine in children. Special care may be needed. Overdosage: If you think you have taken too much of this medicine contact a poison control center or emergency room at once. NOTE: This medicine is only for you. Do not share this medicine with others. What if I miss a dose? If you miss a dose, take it as soon as you can. If it is almost time for your next dose, take only that dose. Do not take double or extra doses. What may interact with this medicine? Do not take this medicine with any of the following medications:  medicines used to treat erectile dysfunction (ED) like avanafil, sildenafil, tadalafil, and vardenafil  riociguat This medicine may also interact with the following medications:  medicines for high blood pressure  other medicines for angina or heart failure This list may not describe all possible interactions. Give your health care provider a list of all the medicines, herbs, non-prescription drugs, or dietary supplements you use. Also tell them if you smoke, drink alcohol, or use illegal drugs. Some items may interact with your medicine. What should I watch for while using this  medicine? Check your heart rate and blood pressure regularly while you are taking this medicine. Ask your doctor or health care professional what your heart rate and blood pressure should be and when you should contact him or her. Tell your doctor or health care professional if you feel your medicine is no longer working. You may get dizzy. Do not drive, use machinery, or do anything that needs mental alertness until you know how this medicine affects you. To reduce the risk of dizzy or fainting spells, do not sit or stand up quickly, especially if you are an older patient. Alcohol can make you more dizzy, and increase flushing and rapid heartbeats. Avoid alcoholic drinks. Do not treat yourself for coughs, colds, or pain while you are taking this medicine without asking your doctor or health care professional for advice. Some ingredients may increase your blood pressure. What side effects may I notice from receiving this medicine? Side effects that you should report to your doctor or health care professional as soon as possible:  bluish discoloration of lips, fingernails, or palms of hands  irregular heartbeat, palpitations  low blood pressure  nausea, vomiting  persistent headache  unusually weak or tired Side effects that usually do not require medical  attention (report to your doctor or health care professional if they continue or are bothersome):  flushing of the face or neck  rash This list may not describe all possible side effects. Call your doctor for medical advice about side effects. You may report side effects to FDA at 1-800-FDA-1088. Where should I keep my medicine? Keep out of the reach of children. Store between 15 and 30 degrees C (59 and 86 degrees F). Keep container tightly closed. Throw away any unused medicine after the expiration date. NOTE: This sheet is a summary. It may not cover all possible information. If you have questions about this medicine, talk to your  doctor, pharmacist, or health care provider.  2020 Elsevier/Gold Standard (2013-08-02 14:48:19)

## 2020-05-07 ENCOUNTER — Telehealth: Payer: Self-pay | Admitting: Cardiology

## 2020-05-07 NOTE — Telephone Encounter (Signed)
Patient's wife states the patient had a biopsy by a neurosurgeon 2 weeks ago and he still has a stitch in from the procedure. She states the stitch is causing discomfort and she assumes it has become infected. She states the provider who performed the procedure is not available and she would like to discuss with Dr. Servando Salina.

## 2020-05-07 NOTE — Telephone Encounter (Signed)
I would prefer that the patient and his wife speak with the neurosurgeon or one of his partners to help address the possible infection on the stitch.  If not I think the primary provider will be the next best option.

## 2020-05-07 NOTE — Telephone Encounter (Signed)
Spoke with patient and patients wife and let them know Dr. Mallory Shirk recommendations. They verbalizes understanding and thank me for the call back.    Encouraged patient to call back with any questions or concerns.

## 2020-05-14 ENCOUNTER — Ambulatory Visit (INDEPENDENT_AMBULATORY_CARE_PROVIDER_SITE_OTHER): Payer: Medicare Other

## 2020-05-14 ENCOUNTER — Other Ambulatory Visit: Payer: Self-pay

## 2020-05-14 DIAGNOSIS — Q2112 Patent foramen ovale: Secondary | ICD-10-CM

## 2020-05-14 DIAGNOSIS — Q211 Atrial septal defect: Secondary | ICD-10-CM | POA: Diagnosis not present

## 2020-05-14 LAB — ECHOCARDIOGRAM LIMITED BUBBLE STUDY
AR max vel: 1.24 cm2
AV Area VTI: 1.3 cm2
AV Area mean vel: 1.32 cm2
AV Mean grad: 14 mmHg
AV Peak grad: 30.9 mmHg
Ao pk vel: 2.78 m/s
Area-P 1/2: 3.65 cm2
S' Lateral: 2.27 cm

## 2020-05-14 NOTE — Progress Notes (Signed)
Complete echocardiogram with bubble study performed.  Jimmy Mattie Novosel RDCS, RVT  

## 2020-05-18 ENCOUNTER — Telehealth: Payer: Self-pay

## 2020-05-18 MED ORDER — ISOSORBIDE MONONITRATE ER 30 MG PO TB24: 15.0000 mg | ORAL_TABLET | Freq: Every day | ORAL | 3 refills | Status: DC

## 2020-05-18 NOTE — Telephone Encounter (Signed)
Spoke with patients wife regarding results and recommendation. ° °She verbalizes understanding and is agreeable to plan of care. Advised patient to call back with any issues or concerns.  °

## 2020-05-18 NOTE — Telephone Encounter (Signed)
-----   Message from [name], DO sent at 05/15/2020  4:59 PM EDT ----- Echo normal, his agitated saline study did not show any evidence of PFO

## 2020-05-18 NOTE — Telephone Encounter (Signed)
Left message on patients voicemail to please return our call.   

## 2020-05-18 NOTE — Telephone Encounter (Signed)
Follow up     Pts wife is returning call   Please call back  

## 2020-05-18 NOTE — Addendum Note (Signed)
Addended by: Delorse Limber I on: 05/18/2020 12:08 PM   Modules accepted: Orders

## 2020-05-25 ENCOUNTER — Ambulatory Visit: Payer: Medicare Other | Admitting: Thoracic Surgery (Cardiothoracic Vascular Surgery)

## 2020-05-27 ENCOUNTER — Telehealth: Payer: Self-pay

## 2020-05-27 NOTE — Telephone Encounter (Signed)
Spoke with patient regarding results and recommendation.  Patient verbalizes understanding and is agreeable to plan of care. Advised patient to call back with any issues or concerns.  

## 2020-05-27 NOTE — Telephone Encounter (Signed)
-----   Message from Dennis Ripple, DO sent at 05/27/2020  8:15 AM EDT ----- Have the patient see me in the next week please.

## 2020-06-08 ENCOUNTER — Ambulatory Visit (INDEPENDENT_AMBULATORY_CARE_PROVIDER_SITE_OTHER): Payer: Medicare Other | Admitting: Thoracic Surgery (Cardiothoracic Vascular Surgery)

## 2020-06-08 ENCOUNTER — Encounter: Payer: Self-pay | Admitting: Thoracic Surgery (Cardiothoracic Vascular Surgery)

## 2020-06-08 ENCOUNTER — Other Ambulatory Visit: Payer: Self-pay

## 2020-06-08 VITALS — BP 138/80 | HR 75 | Temp 97.8°F | Resp 20 | Ht 65.0 in | Wt 151.0 lb

## 2020-06-08 DIAGNOSIS — I639 Cerebral infarction, unspecified: Secondary | ICD-10-CM | POA: Diagnosis not present

## 2020-06-08 DIAGNOSIS — Z951 Presence of aortocoronary bypass graft: Secondary | ICD-10-CM

## 2020-06-08 DIAGNOSIS — Z953 Presence of xenogenic heart valve: Secondary | ICD-10-CM

## 2020-06-08 NOTE — Progress Notes (Signed)
[address]       [name] [address]             [phone]     CARDIOTHORACIC SURGERY OFFICE NOTE  Referring Provider is Tonny Bollman, MD Primary Cardiologist is [name], DO PCP is [name], MD   HPI:  Patient is a 69 year old male who returns the office today for routine follow-up approximately 1 year status post aortic valve replacement using a bioprosthetic tissue valve and coronary artery bypass grafting x4 on May 21, 2019 for bicuspid aortic valve with severe aortic insufficiency and severe three-vessel coronary artery disease with unstable angina pectoris.  He was last seen here in our office on July 22, 2019 at which time he was doing fairly well.  Since then he has been followed up carefully by Dr. Servando Salina.  He did well from a cardiac standpoint but in May of this year he suffered sudden loss of vision in his right eye and was diagnosed with central retinal artery occlusion.  The possibility of cryptogenic stroke was entertained.  He was treated at [facility] in [place] and underwent multiple diagnostic tests.  There was question about possible patent foramen ovale based on echocardiogram, although TEE performed at the time of the patient's surgery last year did not reveal any signs of any type of atrial septal defect.  Following most recent appointment with Dr.Tobb another follow-up echocardiogram was performed with bubble study and revealed normal functioning bioprosthetic tissue valve in the aortic position with normal left ventricular systolic function and no evidence for atrial septal defect.  The patient did undergo placement of a ZIO patch event monitor which reportedly revealed 3 episodes of nonsustained ventricular tachycardia, paroxysmal atrial tachycardia with variable block, and symptomatic ectopic atrial rhythm.  Patient returns to our office today for routine follow-up.  He states that he did very well for  more than 6 months but he has not been doing as well recently.  He denies any shortness of breath.  He remains active physically but he does complain of some exertional chest discomfort.  He describes this discomfort as dull pain in his mid chest that is brought on with strenuous exertion and usually relieved promptly by rest.  He has not had any prolonged episodes.  He has not had any episodes at rest or at night when he was sleeping.  Symptoms have always stopped following rest.  He has not had any palpitations.  He has not had any dizzy spells or syncope.  He is scheduled for follow-up appointment with Dr. Servando Salina later this week.  Current Outpatient Medications  Medication Sig Dispense Refill  . atorvastatin (LIPITOR) 80 MG tablet Take 1 tablet (80 mg total) by mouth daily at 6 PM. 90 tablet 3  . clopidogrel (PLAVIX) 75 MG tablet Take 1 tablet by mouth daily.    . isosorbide mononitrate (IMDUR) 30 MG 24 hr tablet Take 0.5 tablets (15 mg total) by mouth daily. 45 tablet 3  . levothyroxine (SYNTHROID) 112 MCG tablet TAKE 1 TABLET DAILY IN THE MORNING ON AN EMPTY STOMACH FOR HYPOTHYROIDISM    . losartan (COZAAR) 25 MG tablet Take 1 tablet (25 mg total) by mouth daily. 90 tablet 3   No current facility-administered medications for this visit.      Physical Exam:   BP 138/80   Pulse 75   Temp 97.8 F (36.6 C) (Skin)   Resp 20   Ht 5\' 5"  (  1.651 m)   Wt 151 lb (68.5 kg)   SpO2 94% Comment: RA  BMI 25.13 kg/m   General:  Well-appearing  Chest:   Clear to auscultation  CV:   Regular rate and rhythm with soft systolic murmur  Incisions:  n/a  Abdomen:  Soft nontender  Extremities:  Warm and well-perfused  Diagnostic Tests:  ECHOCARDIOGRAM LIMITED REPORT       Patient Name:  JAHAUN SULLO Date of Exam: 05/14/2020  Medical Rec #: [id number]    Height:    65.0 in  Accession #:  [id number]   Weight:    144.2 lb  Date of Birth: 1951-05-01    BSA:     1.721 m   Patient Age:  68 years    BP:      128/80 mmHg  Patient Gender: M        HR:      56 bpm.  Exam Location: [facility]   Procedure: Saline Contrast Bubble Study and 2D Echo   Indications:  PFO with atrial septal aneurysm [Q21.1 (ICD-10-CM)]    History:    Patient has prior history of Echocardiogram examinations,  most         recent 09/09/2019. CAD, Prior CABG, S/P aortic valve  replacement         with bioprosthetic valve, Signs/Symptoms:Chest Pain and  Syncope;         Risk Factors:Current Smoker, Dyslipidemia and  Hypertension.         Aortic Valve: Inspra #25 valve is present in the aortic         position. Procedure Date: 05/21/2019.    Sonographer:  Louie Boston  Referring Phys: [id number] KARDIE TOBB   IMPRESSIONS    1. Left ventricular ejection fraction, by estimation, is 60 to 65%. The  left ventricle has normal function. The left ventricle has no regional  wall motion abnormalities. There is mild concentric left ventricular  hypertrophy. Left ventricular diastolic  parameters are consistent with Grade II diastolic dysfunction  (pseudonormalization). Elevated left atrial pressure.  2. Right ventricular systolic function is normal. The right ventricular  size is normal. There is normal pulmonary artery systolic pressure.  3. The mitral valve is normal in structure. Mild mitral valve  regurgitation. No evidence of mitral stenosis.  4. The aortic valve has been repaired/replaced. Aortic valve  regurgitation is not visualized. No aortic stenosis is present. There is a  Inspra #25 valve present in the aortic position. Procedure Date:  05/21/2019. Echo findings are consistent with normal  structure and function of the aortic valve prosthesis.  5. There is moderate dilatation of the aortic root measuring 43 mm.  6. The inferior vena cava is normal in size with greater than 50%  respiratory  variability, suggesting right atrial pressure of 3 mmHg.   FINDINGS  Left Ventricle: Left ventricular ejection fraction, by estimation, is 60  to 65%. The left ventricle has normal function. The left ventricle has no  regional wall motion abnormalities. The left ventricular internal cavity  size was normal in size. There is  mild concentric left ventricular hypertrophy. Elevated left atrial  pressure.   Right Ventricle: The right ventricular size is normal. No increase in  right ventricular wall thickness. Right ventricular systolic function is  normal. There is normal pulmonary artery systolic pressure. The tricuspid  regurgitant velocity is 2.36 m/s, and  with an assumed right atrial pressure of 3 mmHg, the estimated right  ventricular systolic pressure is 25.3 mmHg.  Left Atrium: Left atrial size was normal in size.   Right Atrium: Right atrial size was normal in size.   Pericardium: There is no evidence of pericardial effusion.   Mitral Valve: The mitral valve is normal in structure. Normal mobility of  the mitral valve leaflets. Mild mitral valve regurgitation. No evidence of  mitral valve stenosis.   Tricuspid Valve: The tricuspid valve is normal in structure. Tricuspid  valve regurgitation is mild . No evidence of tricuspid stenosis.   Aortic Valve: The aortic valve has been replaced. Aortic valve  regurgitation is not visualized. No aortic stenosis is present. Aortic  valve mean gradient measures 14.0 mmHg. Aortic valve peak gradient  measures 30.9 mmHg. Aortic valve area, by VTI  measures 1.30 cm. There is a Inspra #25 valve present in the aortic  position. Procedure Date: 05/21/2019. Echo findings are consistent with  normal structure and function of the aortic valve prosthesis.   Pulmonic Valve: The pulmonic valve was normal in structure. Pulmonic valve  regurgitation is not visualized. No evidence of pulmonic stenosis.   Aorta: There is moderate dilatation of  the aortic root measuring 43 mm.   Venous: The inferior vena cava is normal in size with greater than 50%  respiratory variability, suggesting right atrial pressure of 3 mmHg.   IAS/Shunts: No atrial level shunt detected by color flow Doppler. Agitated  saline contrast was given intravenously to evaluate for intracardiac  shunting.   LEFT VENTRICLE  PLAX 2D  LVIDd:     4.10 cm Diastology  LVIDs:     2.27 cm LV e' lateral:  6.85 cm/s  LV PW:     1.40 cm LV E/e' lateral: 18.2  LV IVS:    1.30 cm LV e' medial:  4.68 cm/s  LVOT diam:   1.90 cm LV E/e' medial: 26.7  LV SV:     81  LV SV Index:  47  LVOT Area:   2.84 cm     RIGHT VENTRICLE      IVC  RV S prime:   8.70 cm/s IVC diam: 1.30 cm  TAPSE (M-mode): 1.8 cm   LEFT ATRIUM       Index    RIGHT ATRIUM      Index  LA diam:    3.30 cm 1.92 cm/m RA Area:   14.10 cm  LA Vol (A2C):  45.8 ml 26.61 ml/m RA Volume:  35.70 ml 20.74 ml/m  LA Vol (A4C):  32.6 ml 18.94 ml/m  LA Biplane Vol: 38.4 ml 22.31 ml/m  AORTIC VALVE  AV Area (Vmax):  1.24 cm  AV Area (Vmean):  1.32 cm  AV Area (VTI):   1.30 cm  AV Vmax:      278.00 cm/s  AV Vmean:     173.000 cm/s  AV VTI:      0.625 m  AV Peak Grad:   30.9 mmHg  AV Mean Grad:   14.0 mmHg  LVOT Vmax:     122.00 cm/s  LVOT Vmean:    80.300 cm/s  LVOT VTI:     0.287 m  LVOT/AV VTI ratio: 0.46    AORTA  Ao Root diam: 2.80 cm  Ao Sinus diam: 3.60 cm  Ao STJ diam:  3.5 cm  Ao Asc diam:  4.30 cm   MITRAL VALVE        TRICUSPID VALVE  MV Area (PHT): 3.65 cm   TR Peak grad:  22.3 mmHg  MV Decel Time: 208  msec   TR Vmax:    236.00 cm/s  MV E velocity: 125.00 cm/s  MV A velocity: 98.50 cm/s  SHUNTS  MV E/A ratio: 1.27     Systemic VTI: 0.29 m               Systemic Diam: 1.90 cm   Norman Herrlich MD  Electronically signed by Norman Herrlich MD  Signature Date/Time: 05/14/2020/1:53:08 PM    Impression:  Patient appears clinically stable approximately 1 year status post aortic valve replacement using a bioprosthetic tissue valve and coronary artery bypass grafting.  He does describe some symptoms of exertional chest discomfort that may be atypical but could be consistent with exertional angina pectoris.  He denies any symptoms of congestive heart failure.  He has not had palpitations, dizzy spells, nor syncope.  He suffered sudden loss of vision in his right eye several months ago due to central retinal artery occlusion.  He was taken off of aspirin and started on Plavix at that time.  He otherwise remains on standard medical therapy for coronary artery disease.  I have personally reviewed the patient's recent follow-up echocardiogram which demonstrates normal left ventricular function with normal functioning bioprosthetic tissue valve in the aortic position.    Plan:  We will defer any subsequent plans for changes in medical therapy and/or stress test or follow-up diagnostic cardiac catheterization to Dr. Servando Salina who plans to see the patient later this week.  The patient will return to our office as needed should specific problems or questions arise.  All of their questions have been addressed.    I spent in excess of 15 minutes during the conduct of this office consultation and >50% of this time involved direct face-to-face encounter with the patient for counseling and/or coordination of their care.    Salvatore Decent. Cornelius Moras, MD 06/08/2020 2:44 PM

## 2020-06-08 NOTE — Patient Instructions (Signed)

## 2020-06-11 ENCOUNTER — Encounter: Payer: Self-pay | Admitting: Cardiology

## 2020-06-11 ENCOUNTER — Other Ambulatory Visit: Payer: Self-pay

## 2020-06-11 ENCOUNTER — Ambulatory Visit (INDEPENDENT_AMBULATORY_CARE_PROVIDER_SITE_OTHER): Payer: Medicare Other | Admitting: Cardiology

## 2020-06-11 VITALS — BP 110/70 | HR 84 | Ht 65.0 in | Wt 152.0 lb

## 2020-06-11 DIAGNOSIS — N1831 Chronic kidney disease, stage 3a: Secondary | ICD-10-CM

## 2020-06-11 DIAGNOSIS — Z953 Presence of xenogenic heart valve: Secondary | ICD-10-CM

## 2020-06-11 DIAGNOSIS — Z720 Tobacco use: Secondary | ICD-10-CM

## 2020-06-11 DIAGNOSIS — I471 Supraventricular tachycardia: Secondary | ICD-10-CM | POA: Diagnosis not present

## 2020-06-11 DIAGNOSIS — I10 Essential (primary) hypertension: Secondary | ICD-10-CM | POA: Diagnosis not present

## 2020-06-11 DIAGNOSIS — I25119 Atherosclerotic heart disease of native coronary artery with unspecified angina pectoris: Secondary | ICD-10-CM | POA: Diagnosis not present

## 2020-06-11 DIAGNOSIS — I472 Ventricular tachycardia: Secondary | ICD-10-CM

## 2020-06-11 DIAGNOSIS — Z951 Presence of aortocoronary bypass graft: Secondary | ICD-10-CM

## 2020-06-11 DIAGNOSIS — I4729 Other ventricular tachycardia: Secondary | ICD-10-CM

## 2020-06-11 DIAGNOSIS — E785 Hyperlipidemia, unspecified: Secondary | ICD-10-CM

## 2020-06-11 MED ORDER — ASPIRIN EC 81 MG PO TBEC: 81.0000 mg | DELAYED_RELEASE_TABLET | Freq: Every day | ORAL | 3 refills | Status: DC

## 2020-06-11 MED ORDER — NITROGLYCERIN 0.4 MG SL SUBL: 0.4000 mg | SUBLINGUAL_TABLET | SUBLINGUAL | 3 refills | Status: DC | PRN

## 2020-06-11 MED ORDER — METOPROLOL SUCCINATE ER 25 MG PO TB24: 12.5000 mg | ORAL_TABLET | Freq: Every day | ORAL | 3 refills | Status: DC

## 2020-06-11 NOTE — H&P (View-Only) (Signed)
Cardiology Office Note:    Date:  06/11/2020   ID:  Dennis Gomez, DOB 12/07/1950, MRN [id number]  PCP:  Burgart, Jennifer, MD  Cardiologist:  Piccola Arico, DO  Electrophysiologist:  None   Referring MD: Burgart, Jennifer, MD   Chief Complaint  Patient presents with  . Follow-up    Monitor    History of Present Illness:    Dennis Gomezis a 68 y.o.malewith a hx of COPD, tobacco abuse(quit), hypothroidism,HTN, acute CHF/LV dysfunction (EF now 60-65% 08/2019- improvement from 35-40%), moderate-severe aortic regurgitation s/p AVRwith inspiris 25mm bioprosthesis, CAD s/p CABGX4presents for follow up.  I last saw the patient January 31, 2020 at that time he appeared to be stable from a cardiovascular standpoint he had resolved his nosebleed.  We will continue his medication including aspirin statin and recommended follow-up in 6 months.  In the interim   Per records on May 14 he was doing work when he suddenly experienced loss of vision in his right eye.  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  The etiology was unknown but there was concern about cryptogenic stroke as well.  He was discharged on aspirin and Plavix for 30 days at which time he was asked to stop the aspirin continue Plavix.  He also kept on his Lipitor to milligrams daily.  I saw the patient April 27, 2020 at that time giving his interim report of being hospitalized at [facility] and questionable PFO I had the patient get a echo with bubble study.  Also at the time discussed with the patient he had a TEE doing his valve surgery and it did not show any PFO.  He was able to get his transthoracic echocardiogram done with continued saline study and this was negative for PFO.  In addition due to the concern for cryptogenic stroke a monitor was placed on the patient.  The patient is here  today for follow-up visit he tells me that he has been experiencing intermittent chest pain.  He described as a midsternal chest pressure-like sensation.  Lasts a few minutes.  He is a does not matter what it happens when he is sitting down or doing activity.  The shortness of breath does happen mostly with activity and then comes another wave of chest tightness.  This has been getting progressively worse.  He is concerned about this.  Past Medical History:  Diagnosis Date  . Acute kidney injury (HCC) 05/17/2019  . Aortic regurgitation   . Chest pain 05/16/2019  . Chronic combined systolic and diastolic CHF (congestive heart failure) (HCC)    a. LVEF 35-40% at [place] in 04/2019, 45-50% at [facility] pre-AVR. 25-30% by repeat echo 05/2019.  . Chronic kidney disease (CKD), stage III (moderate)   . Chronic systolic congestive heart failure (HCC)   . COPD (chronic obstructive pulmonary disease) (HCC)   . Coronary artery disease    a. s/p CABGx4 in 05/2019.  . Essential hypertension   . Hyperlipidemia LDL goal <70   . Hypothyroid   . Hypothyroidism 05/16/2019  . PAD (peripheral artery disease) (HCC)    a. R/L ABIs indicated mild lower extremity arterial disease and carotid duplex showed 1-39% stenosis.  . Postoperative atrial fibrillation (HCC)   . S/P aortic valve replacement with bioprosthetic valve 05/21/2019   25 mm Edwards Inspiris Resilia stented bovine pericardial tissue valve  . S/P CABG x 4   05/21/2019   LIMA to LAD, SVG to D1, SVG to OM, SVG to LPDA with EVH via right thigh and leg  . Severe aortic regurgitation   . Syncope 06/17/2019  . Tobacco abuse     Past Surgical History:  Procedure Laterality Date  . AORTIC VALVE REPLACEMENT N/A 05/21/2019   Procedure: AORTIC VALVE REPLACEMENT (AVR) using Inspiris Valve size 25mm;  Surgeon: Owen, [name], MD;  Location: [facility] OR;  Service: Open Heart Surgery;  Laterality: N/A;  . CORONARY ARTERY BYPASS GRAFT N/A 05/21/2019   Procedure: CORONARY ARTERY  BYPASS GRAFTING (CABG) x Four , using left internal mammary artery and right leg greater saphenous vein harvested endoscopically;  Surgeon: Owen, [name], MD;  Location: [facility] OR;  Service: Open Heart Surgery;  Laterality: N/A;  . RIGHT/LEFT HEART CATH AND CORONARY ANGIOGRAPHY N/A 05/17/2019   Procedure: RIGHT/LEFT HEART CATH AND CORONARY ANGIOGRAPHY;  Surgeon: Cooper, Michael, MD;  Location: [facility] INVASIVE CV LAB;  Service: Cardiovascular;  Laterality: N/A;  . TEE WITHOUT CARDIOVERSION N/A 05/21/2019   Procedure: TRANSESOPHAGEAL ECHOCARDIOGRAM (TEE);  Surgeon: Owen, [name], MD;  Location: [facility] OR;  Service: Open Heart Surgery;  Laterality: N/A;    Current Medications: Current Meds  Medication Sig  . atorvastatin (LIPITOR) 80 MG tablet Take 1 tablet (80 mg total) by mouth daily at 6 PM.  . clopidogrel (PLAVIX) 75 MG tablet Take 1 tablet by mouth daily.  . isosorbide mononitrate (IMDUR) 30 MG 24 hr tablet Take 0.5 tablets (15 mg total) by mouth daily.  . levothyroxine (SYNTHROID) 112 MCG tablet TAKE 1 TABLET DAILY IN THE MORNING ON AN EMPTY STOMACH FOR HYPOTHYROIDISM  . predniSONE (DELTASONE) 10 MG tablet Take 10 mg by mouth daily.  . [DISCONTINUED] losartan (COZAAR) 25 MG tablet Take 1 tablet (25 mg total) by mouth daily.     Allergies:   Hydrocodone   Social History   Socioeconomic History  . Marital status: Married    Spouse name: Not on file  . Number of children: Not on file  . Years of education: Not on file  . Highest education level: Not on file  Occupational History  . Not on file  Tobacco Use  . Smoking status: Former Smoker    Types: Cigarettes    Quit date: 05/17/2019    Years since quitting: 1.0  . Smokeless tobacco: Never Used  Substance and Sexual Activity  . Alcohol use: Not Currently  . Drug use: Not on file  . Sexual activity: Not on file  Other Topics Concern  . Not on file  Social History Narrative  . Not on file   Social Determinants of Health   Financial  Resource Strain:   . Difficulty of Paying Living Expenses: Not on file  Food Insecurity:   . Worried About Running Out of Food in the Last Year: Not on file  . Ran Out of Food in the Last Year: Not on file  Transportation Needs:   . Lack of Transportation (Medical): Not on file  . Lack of Transportation (Non-Medical): Not on file  Physical Activity:   . Days of Exercise per Week: Not on file  . Minutes of Exercise per Session: Not on file  Stress:   . Feeling of Stress : Not on file  Social Connections:   . Frequency of Communication with Friends and Family: Not on file  . Frequency of Social Gatherings with Friends and Family: Not on file  . Attends Religious Services: Not on file  .   Active Member of Clubs or Organizations: Not on file  . Attends Club or Organization Meetings: Not on file  . Marital Status: Not on file     Family History: The patient's family history includes COPD in his father; Cancer in his mother.  ROS:   Review of Systems  Constitution: Negative for decreased appetite, fever and weight gain.  HENT: Negative for congestion, ear discharge, hoarse voice and sore throat.   Eyes: Negative for discharge, redness, vision loss in right eye and visual halos.  Cardiovascular: Negative for chest pain, dyspnea on exertion, leg swelling, orthopnea and palpitations.  Respiratory: Negative for cough, hemoptysis, shortness of breath and snoring.   Endocrine: Negative for heat intolerance and polyphagia.  Hematologic/Lymphatic: Negative for bleeding problem. Does not bruise/bleed easily.  Skin: Negative for flushing, nail changes, rash and suspicious lesions.  Musculoskeletal: Negative for arthritis, joint pain, muscle cramps, myalgias, neck pain and stiffness.  Gastrointestinal: Negative for abdominal pain, bowel incontinence, diarrhea and excessive appetite.  Genitourinary: Negative for decreased libido, genital sores and incomplete emptying.  Neurological: Negative for  brief paralysis, focal weakness, headaches and loss of balance.  Psychiatric/Behavioral: Negative for altered mental status, depression and suicidal ideas.  Allergic/Immunologic: Negative for HIV exposure and persistent infections.    EKGs/Labs/Other Studies Reviewed:    The following studies were reviewed today:   EKG: None today  ZIo monitor The patient wore the monitor for 13 days 18 hours starting April 27, 2020. Indication: Palpitations  The minimum heart rate was 49 bpm, maximum heart rate was 255 bpm, and average heart rate was 78 bpm. Predominant underlying rhythm was Sinus Rhythm. Ectopic Atrial Rhythm was noted.  3 Ventricular Tachycardia runs occurred, the run with the fastest interval lasting 8 beats with a maximum rate of 255 bpm, the longest lasting 9 beats with an average rate of 145 bpm.   78 Supraventricular Tachycardia runs occurred, the run with the fastest interval lasting 6 beats with a max rate of 200 bpm, the longest lasting 16.5 second with an average rate of 140 bpm.   Premature atrial complexes  were rare (<1.0%). Premature Ventricular complexes were rare (<1.0%). Ventricular Bigeminy was present.  No pauses, No AV block and no atrial fibrillation present. 18 patient triggered events: 7 associated with premature atrial complex., 6 associated with ectopic atrial rhythm, remaining associated with sinus rhythm. 7 diary events noted 3 associated with ectopic atrial rhythm, 2 associated with premature atrial complex.  This study is remarkable for the following:                             1.  3 episodes of nonsustained ventricular tachycardia.                             2.  Paroxysmal atrial tachycardia with variable block.                             3.  Symptomatic ectopic atrial rhythm   TTE  IMPRESSIONS  1. Left ventricular ejection fraction, by estimation, is 60 to 65%. The  left ventricle has normal function. The left ventricle has no regional   wall motion abnormalities. There is mild concentric left ventricular  hypertrophy. Left ventricular diastolic  parameters are consistent with Grade II diastolic dysfunction  (pseudonormalization). Elevated left atrial pressure.  2. Right   ventricular systolic function is normal. The right ventricular  size is normal. There is normal pulmonary artery systolic pressure.  3. The mitral valve is normal in structure. Mild mitral valve  regurgitation. No evidence of mitral stenosis.  4. The aortic valve has been repaired/replaced. Aortic valve  regurgitation is not visualized. No aortic stenosis is present. There is a  Inspra #25 valve present in the aortic position. Procedure Date:  05/21/2019. Echo findings are consistent with normal  structure and function of the aortic valve prosthesis.  5. There is moderate dilatation of the aortic root measuring 43 mm.  6. The inferior vena cava is normal in size with greater than 50%  respiratory variability, suggesting right atrial pressure of 3 mmHg  Recent Labs: 06/18/2019: TSH 15.100 07/31/2019: BUN 21; Creatinine, Ser 0.99; Magnesium 1.9; Potassium 5.1; Sodium 144 11/27/2019: Hemoglobin 12.6; Platelets 197  Recent Lipid Panel    Component Value Date/Time   CHOL 109 07/31/2019 0855   TRIG 80 07/31/2019 0855   HDL 33 (L) 07/31/2019 0855   CHOLHDL 3.3 07/31/2019 0855   LDLCALC 60 07/31/2019 0855    Physical Exam:    VS:  BP 110/70 (BP Location: Right Arm, Patient Position: Sitting, Cuff Size: Normal)   Pulse 84   Ht 5' 5" (1.651 m)   Wt 152 lb (68.9 kg)   SpO2 94%   BMI 25.29 kg/m     Wt Readings from Last 3 Encounters:  06/11/20 152 lb (68.9 kg)  06/08/20 151 lb (68.5 kg)  04/27/20 144 lb 3.2 oz (65.4 kg)     GEN: Well nourished, well developed in no acute distress HEENT: Normal NECK: No JVD; No carotid bruits LYMPHATICS: No lymphadenopathy CARDIAC: S1S2 noted,RRR, no murmurs, rubs, gallops RESPIRATORY:  Clear to  auscultation without rales, wheezing or rhonchi  ABDOMEN: Soft, non-tender, non-distended, +bowel sounds, no guarding. EXTREMITIES: No edema, No cyanosis, no clubbing MUSCULOSKELETAL:  No deformity  SKIN: Warm and dry NEUROLOGIC:  Alert and oriented x 3, non-focal PSYCHIATRIC:  Normal affect, good insight  ASSESSMENT:    1. NSVT (nonsustained ventricular tachycardia) (HCC)   2. PAT (paroxysmal atrial tachycardia) (HCC)   3. Essential hypertension   4. Coronary artery disease involving native coronary artery of native heart with angina pectoris (HCC)   5. S/P aortic valve replacement with bioprosthetic valve    6. S/P CABG x 4   7. Tobacco abuse   8. Hyperlipidemia LDL goal <70   9. Stage 3a chronic kidney disease    PLAN:     His chest pain is concerning for angina given the patient does have history of coronary artery disease and is high risk for progression on like to pursue a cardiac catheterization in this patient.  In addition his monitor showed evidence of multiple episodes of nonsustained ventricular tachycardia.  Also restart the patient on his aspirin 81 mg daily, he will continue his Plavix 5 mg daily.  He will stay on his Lipitor for now.  We have discussed cardiac catheterization which I would like to schedule in the office today.  But the patient and his wife would like to speak to his sister who drives him to [place] to make sure that this time works for her.  She will call our office tomorrow with date and time.  The patient understands that risks include but are not limited to stroke (1 in 1000), death (1 in 1000), kidney failure [usually temporary] (1 in 500), bleeding (1 in 200), allergic reaction [  possibly serious] (1 in 200), and agrees to proceed.  Sublingual nitroglycerin prescription was sent, its protocol and 911 protocol explained and the patient vocalized understanding questions were answered to the patient's satisfaction  In terms of the nonsustained  ventricular tachycardia and 78 episodes were paroxysmal atrial tachycardia I like to place the patient on low-dose beta-blocker.  I am going to stop losartan as he does have marginal blood pressure and start Toprol-XL 12.5 mg daily.    The patient is in agreement with the above plan. The patient left the office in stable condition.  The patient will follow up in   Medication Adjustments/Labs and Tests Ordered: Current medicines are reviewed at length with the patient today.  Concerns regarding medicines are outlined above.  Orders Placed This Encounter  Procedures  . CBC  . Basic metabolic panel   Meds ordered this encounter  Medications  . nitroGLYCERIN (NITROSTAT) 0.4 MG SL tablet    Sig: Place 1 tablet (0.4 mg total) under the tongue every 5 (five) minutes as needed for chest pain.    Dispense:  90 tablet    Refill:  3  . aspirin EC 81 MG tablet    Sig: Take 1 tablet (81 mg total) by mouth daily. Swallow whole.    Dispense:  90 tablet    Refill:  3  . metoprolol succinate (TOPROL XL) 25 MG 24 hr tablet    Sig: Take 0.5 tablets (12.5 mg total) by mouth daily.    Dispense:  45 tablet    Refill:  3    Patient Instructions  Medication Instructions:  Your physician has recommended you make the following change in your medication:  STOP: Losartan START: TOPROL XL 12.5 mg take 0.5 tablet by mouth daily.  START: Nitroglycerin 0.4 mg take one tablet by mouth every 5 minutes as needed for chest pain.  *If you need a refill on your cardiac medications before your next appointment, please call your pharmacy*   Lab Work: Your physician recommends that you return for lab work in: TODAY CBC, BMP If you have labs (blood work) drawn today and your tests are completely normal, you will receive your results only by: . MyChart Message (if you have MyChart) OR . A paper copy in the mail If you have any lab test that is abnormal or we need to change your treatment, we will call you to  review the results.   Testing/Procedures:    [facility] CARDIOVASCULAR DIVISION [facility] [place] [address] [place] [address] Dept: [phone] Loc: [phone]  Darcey Mitzel  06/11/2020  You are scheduled for a Cardiac Catheterization on , ____with Dr. _____.  1. Please arrive at the [facility] (Main Entrance A) at [facility]: [address] [place], [place] [address] at _____ (This time is two hours before your procedure to ensure your preparation). Free valet parking service is available.   Special note: Every effort is made to have your procedure done on time. Please understand that emergencies sometimes delay scheduled procedures.  2. Diet: Do not eat solid foods after midnight.  The patient may have clear liquids until 5am upon the day of the procedure.  3. Labs: You will need to have blood drawn on TODAY  4. Medication instructions in preparation for your procedure:   Contrast Allergy: No  On the morning of your procedure, take your Aspirin and any morning medicines NOT listed above.  You may use sips   of water.  5. Plan for one night stay--bring personal belongings. 6. Bring a current list of your medications and current insurance cards. 7. You MUST have a responsible person to drive you home. 8. Someone MUST be with you the first 24 hours after you arrive home or your discharge will be delayed. 9. Please wear clothes that are easy to get on and off and wear slip-on shoes.  Thank you for allowing us to care for you!   -- [place] [facility]    Follow-Up: At [facility], you and your health needs are our priority.  As part of our continuing mission to provide you with exceptional heart care, we have created designated Provider Care Teams.  These Care Teams include your primary Cardiologist (physician) and Advanced Practice Providers (APPs -  Physician Assistants and Nurse  Practitioners) who all work together to provide you with the care you need, when you need it.  We recommend signing up for the patient portal called "MyChart".  Sign up information is provided on this After Visit Summary.  MyChart is used to connect with patients for Virtual Visits (Telemedicine).  Patients are able to view lab/test results, encounter notes, upcoming appointments, etc.  Non-urgent messages can be sent to your provider as well.   To learn more about what you can do with MyChart, go to [email or web address].    Your next appointment:   3 week(s)  The format for your next appointment:   In Person  Provider:   Zareena Willis, DO   Other Instructions      Adopting a Healthy Lifestyle.  Know what a healthy weight is for you (roughly BMI <25) and aim to maintain this   Aim for 7+ servings of fruits and vegetables daily   65-80+ fluid ounces of water or unsweet tea for healthy kidneys   Limit to max 1 drink of alcohol per day; avoid smoking/tobacco   Limit animal fats in diet for cholesterol and heart health - choose grass fed whenever available   Avoid highly processed foods, and foods high in saturated/trans fats   Aim for low stress - take time to unwind and care for your mental health   Aim for 150 min of moderate intensity exercise weekly for heart health, and weights twice weekly for bone health   Aim for 7-9 hours of sleep daily   When it comes to diets, agreement about the perfect plan isnt easy to find, even among the experts. Experts at the [employer] developed an idea known as the Healthy Eating Plate. Just imagine a plate divided into logical, healthy portions.   The emphasis is on diet quality:   Load up on vegetables and fruits - one-half of your plate: Aim for color and variety, and remember that potatoes dont count.   Go for whole grains - one-quarter of your plate: Whole wheat, barley, wheat berries, quinoa, oats, brown  rice, and foods made with them. If you want pasta, go with whole wheat pasta.   Protein power - one-quarter of your plate: Fish, chicken, beans, and nuts are all healthy, versatile protein sources. Limit red meat.   The diet, however, does go beyond the plate, offering a few other suggestions.   Use healthy plant oils, such as olive, canola, soy, corn, sunflower and peanut. Check the labels, and avoid partially hydrogenated oil, which have unhealthy trans fats.   If youre thirsty, drink water. Coffee and tea are good in moderation,   but skip sugary drinks and limit milk and dairy products to one or two daily servings.   The type of carbohydrate in the diet is more important than the amount. Some sources of carbohydrates, such as vegetables, fruits, whole grains, and beans-are healthier than others.   Finally, stay active  Signed, Evolette Pendell, DO  06/11/2020 4:28 PM    [facility] 

## 2020-06-11 NOTE — Patient Instructions (Addendum)
Medication Instructions:  Your physician has recommended you make the following change in your medication:  STOP: Losartan START: TOPROL XL 12.5 mg take 0.5 tablet by mouth daily.  START: Nitroglycerin 0.4 mg take one tablet by mouth every 5 minutes as needed for chest pain.  *If you need a refill on your cardiac medications before your next appointment, please call your pharmacy*   Lab Work: Your physician recommends that you return for lab work in: TODAY CBC, BMP If you have labs (blood work) drawn today and your tests are completely normal, you will receive your results only by: [name] MyChart Message (if you have MyChart) OR . A paper copy in the mail If you have any lab test that is abnormal or we need to change your treatment, we will call you to review the results.   Testing/Procedures:    [facility]ARDIOVASCULAR DIVISION [facility] [place] [address] [place] [address] Dept: [phone] Loc: [phone]  Dennis Gomez  06/11/2020  You are scheduled for a Cardiac Catheterization on , ____with Dr. _____.  1. Please arrive at the [facility] (Main Entrance A) at [facility]: [address] [place], [place] [address] at _____ (This time is two hours before your procedure to ensure your preparation). Free valet parking service is available.   Special note: Every effort is made to have your procedure done on time. Please understand that emergencies sometimes delay scheduled procedures.  2. Diet: Do not eat solid foods after midnight.  The patient may have clear liquids until 5am upon the day of the procedure.  3. Labs: You will need to have blood drawn on TODAY  4. Medication instructions in preparation for your procedure:   Contrast Allergy: No  On the morning of your procedure, take your Aspirin and any morning medicines NOT listed above.  You may use sips of water.  5. Plan for one night stay--bring personal  belongings. 6. Bring a current list of your medications and current insurance cards. 7. You MUST have a responsible person to drive you home. 8. Someone MUST be with you the first 24 hours after you arrive home or your discharge will be delayed. 9. Please wear clothes that are easy to get on and off and wear slip-on shoes.  Thank you for allowing [place] to care for you!   -- [place] [facility]    Follow-Up: At [facility], you and your health needs are our priority.  As part of our continuing mission to provide you with exceptional heart care, we have created designated Provider Care Teams.  These Care Teams include your primary Cardiologist (physician) and Advanced Practice Providers (APPs -  Physician Assistants and Nurse Practitioners) who all work together to provide you with the care you need, when you need it.  We recommend signing up for the patient portal called "MyChart".  Sign up information is provided on this After Visit Summary.  MyChart is used to connect with patients for Virtual Visits (Telemedicine).  Patients are able to view lab/test results, encounter notes, upcoming appointments, etc.  Non-urgent messages can be sent to your provider as well.   To learn more about what you can do with MyChart, go to [email or web address].    Your next appointment:   3 week(s)  The format for your next appointment:   In Person  Provider:   [name], DO   Other Instructions

## 2020-06-11 NOTE — H&P (View-Only) (Signed)
Cardiology Office Note:    Date:  06/11/2020   ID:  Dennis Gomez, DOB Jul 23, 1951, MRN [id number]  PCP:  [name], MD  Cardiologist:  [name], DO  Electrophysiologist:  None   Referring MD: [name], MD   Chief Complaint  Patient presents with  . Follow-up    Monitor    History of Present Illness:    Garhett Gomez a 69 y.o.malewith a hx of COPD, tobacco abuse(quit), hypothroidism,HTN, acute CHF/LV dysfunction (EF now 60-65% 08/2019- improvement from 35-40%), moderate-severe aortic regurgitation s/p AVRwith inspiris 25mm bioprosthesis, CAD s/p CABGX4presents for follow up.  I last saw the patient January 31, 2020 at that time he appeared to be stable from a cardiovascular standpoint he had resolved his nosebleed.  We will continue his medication including aspirin statin and recommended follow-up in 6 months.  In the interim   Per records on May 14 he was doing work when he suddenly experienced loss of vision in his right eye.  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  The etiology was unknown but there was concern about cryptogenic stroke as well.  He was discharged on aspirin and Plavix for 30 days at which time he was asked to stop the aspirin continue Plavix.  He also kept on his Lipitor to milligrams daily.  I saw the patient April 27, 2020 at that time giving his interim report of being hospitalized at [facility] and questionable PFO I had the patient get a echo with bubble study.  Also at the time discussed with the patient he had a TEE doing his valve surgery and it did not show any PFO.  He was able to get his transthoracic echocardiogram done with continued saline study and this was negative for PFO.  In addition due to the concern for cryptogenic stroke a monitor was placed on the patient.  The patient is here  today for follow-up visit he tells me that he has been experiencing intermittent chest pain.  He described as a midsternal chest pressure-like sensation.  Lasts a few minutes.  He is a does not matter what it happens when he is sitting down or doing activity.  The shortness of breath does happen mostly with activity and then comes another wave of chest tightness.  This has been getting progressively worse.  He is concerned about this.  Past Medical History:  Diagnosis Date  . Acute kidney injury (HCC) 05/17/2019  . Aortic regurgitation   . Chest pain 05/16/2019  . Chronic combined systolic and diastolic CHF (congestive heart failure) (HCC)    a. LVEF 35-40% at [facility] in 04/2019, 45-50% at [facility] pre-AVR. 25-30% by repeat echo 05/2019.  [name] Chronic kidney disease (CKD), stage III (moderate)   . Chronic systolic congestive heart failure (HCC)   . COPD (chronic obstructive pulmonary disease) (HCC)   . Coronary artery disease    a. s/p CABGx4 in 05/2019.  [name] Essential hypertension   . Hyperlipidemia LDL goal <70   . Hypothyroid   . Hypothyroidism 05/16/2019  . PAD (peripheral artery disease) (HCC)    a. R/L ABIs indicated mild lower extremity arterial disease and carotid duplex showed 1-39% stenosis.  [name] Postoperative atrial fibrillation (HCC)   . S/P aortic valve replacement with bioprosthetic valve 05/21/2019   25 mm Edwards Inspiris Resilia stented bovine pericardial tissue valve  . S/P CABG x 4  05/21/2019   LIMA to LAD, SVG to D1, SVG to OM, SVG to LPDA with EVH via right thigh and leg  . Severe aortic regurgitation   . Syncope 06/17/2019  . Tobacco abuse     Past Surgical History:  Procedure Laterality Date  . AORTIC VALVE REPLACEMENT N/A 05/21/2019   Procedure: AORTIC VALVE REPLACEMENT (AVR) using Inspiris Valve size 78mm;  Surgeon: Purcell Nails, MD;  Location: [facility] OR;  Service: Open Heart Surgery;  Laterality: N/A;  . CORONARY ARTERY BYPASS GRAFT N/A 05/21/2019   Procedure: CORONARY ARTERY  BYPASS GRAFTING (CABG) x Four , using left internal mammary artery and right leg greater saphenous vein harvested endoscopically;  Surgeon: Purcell Nails, MD;  Location: [facility] OR;  Service: Open Heart Surgery;  Laterality: N/A;  . RIGHT/LEFT HEART CATH AND CORONARY ANGIOGRAPHY N/A 05/17/2019   Procedure: RIGHT/LEFT HEART CATH AND CORONARY ANGIOGRAPHY;  Surgeon: Tonny Bollman, MD;  Location: [facility] INVASIVE CV LAB;  Service: Cardiovascular;  Laterality: N/A;  . TEE WITHOUT CARDIOVERSION N/A 05/21/2019   Procedure: TRANSESOPHAGEAL ECHOCARDIOGRAM (TEE);  Surgeon: Purcell Nails, MD;  Location: [facility] OR;  Service: Open Heart Surgery;  Laterality: N/A;    Current Medications: Current Meds  Medication Sig  . atorvastatin (LIPITOR) 80 MG tablet Take 1 tablet (80 mg total) by mouth daily at 6 PM.  . clopidogrel (PLAVIX) 75 MG tablet Take 1 tablet by mouth daily.  . isosorbide mononitrate (IMDUR) 30 MG 24 hr tablet Take 0.5 tablets (15 mg total) by mouth daily.  [name] levothyroxine (SYNTHROID) 112 MCG tablet TAKE 1 TABLET DAILY IN THE MORNING ON AN EMPTY STOMACH FOR HYPOTHYROIDISM  . predniSONE (DELTASONE) 10 MG tablet Take 10 mg by mouth daily.  . [DISCONTINUED] losartan (COZAAR) 25 MG tablet Take 1 tablet (25 mg total) by mouth daily.     Allergies:   Hydrocodone   Social History   Socioeconomic History  . Marital status: Married    Spouse name: Not on file  . Number of children: Not on file  . Years of education: Not on file  . Highest education level: Not on file  Occupational History  . Not on file  Tobacco Use  . Smoking status: Former Smoker    Types: Cigarettes    Quit date: 05/17/2019    Years since quitting: 1.0  . Smokeless tobacco: Never Used  Substance and Sexual Activity  . Alcohol use: Not Currently  . Drug use: Not on file  . Sexual activity: Not on file  Other Topics Concern  . Not on file  Social History Narrative  . Not on file   Social Determinants of Health   Financial  Resource Strain:   . Difficulty of Paying Living Expenses: Not on file  Food Insecurity:   . Worried About Programme researcher, broadcasting/film/video in the Last Year: Not on file  . Ran Out of Food in the Last Year: Not on file  Transportation Needs:   . Lack of Transportation (Medical): Not on file  . Lack of Transportation (Non-Medical): Not on file  Physical Activity:   . Days of Exercise per Week: Not on file  . Minutes of Exercise per Session: Not on file  Stress:   . Feeling of Stress : Not on file  Social Connections:   . Frequency of Communication with Friends and Family: Not on file  . Frequency of Social Gatherings with Friends and Family: Not on file  . Attends Religious Services: Not on file  .  Active Member of Clubs or Organizations: Not on file  . Attends Banker Meetings: Not on file  . Marital Status: Not on file     Family History: The patient's family history includes COPD in his father; Cancer in his mother.  ROS:   Review of Systems  Constitution: Negative for decreased appetite, fever and weight gain.  HENT: Negative for congestion, ear discharge, hoarse voice and sore throat.   Eyes: Negative for discharge, redness, vision loss in right eye and visual halos.  Cardiovascular: Negative for chest pain, dyspnea on exertion, leg swelling, orthopnea and palpitations.  Respiratory: Negative for cough, hemoptysis, shortness of breath and snoring.   Endocrine: Negative for heat intolerance and polyphagia.  Hematologic/Lymphatic: Negative for bleeding problem. Does not bruise/bleed easily.  Skin: Negative for flushing, nail changes, rash and suspicious lesions.  Musculoskeletal: Negative for arthritis, joint pain, muscle cramps, myalgias, neck pain and stiffness.  Gastrointestinal: Negative for abdominal pain, bowel incontinence, diarrhea and excessive appetite.  Genitourinary: Negative for decreased libido, genital sores and incomplete emptying.  Neurological: Negative for  brief paralysis, focal weakness, headaches and loss of balance.  Psychiatric/Behavioral: Negative for altered mental status, depression and suicidal ideas.  Allergic/Immunologic: Negative for HIV exposure and persistent infections.    EKGs/Labs/Other Studies Reviewed:    The following studies were reviewed today:   EKG: None today  ZIo monitor The patient wore the monitor for 13 days 18 hours starting April 27, 2020. Indication: Palpitations  The minimum heart rate was 49 bpm, maximum heart rate was 255 bpm, and average heart rate was 78 bpm. Predominant underlying rhythm was Sinus Rhythm. Ectopic Atrial Rhythm was noted.  3 Ventricular Tachycardia runs occurred, the run with the fastest interval lasting 8 beats with a maximum rate of 255 bpm, the longest lasting 9 beats with an average rate of 145 bpm.   78 Supraventricular Tachycardia runs occurred, the run with the fastest interval lasting 6 beats with a max rate of 200 bpm, the longest lasting 16.5 second with an average rate of 140 bpm.   Premature atrial complexes  were rare (<1.0%). Premature Ventricular complexes were rare (<1.0%). Ventricular Bigeminy was present.  No pauses, No AV block and no atrial fibrillation present. 18 patient triggered events: 7 associated with premature atrial complex., 6 associated with ectopic atrial rhythm, remaining associated with sinus rhythm. 7 diary events noted 3 associated with ectopic atrial rhythm, 2 associated with premature atrial complex.  This study is remarkable for the following:                             1.  3 episodes of nonsustained ventricular tachycardia.                             2.  Paroxysmal atrial tachycardia with variable block.                             3.  Symptomatic ectopic atrial rhythm   TTE  IMPRESSIONS  1. Left ventricular ejection fraction, by estimation, is 60 to 65%. The  left ventricle has normal function. The left ventricle has no regional   wall motion abnormalities. There is mild concentric left ventricular  hypertrophy. Left ventricular diastolic  parameters are consistent with Grade II diastolic dysfunction  (pseudonormalization). Elevated left atrial pressure.  2. Right  ventricular systolic function is normal. The right ventricular  size is normal. There is normal pulmonary artery systolic pressure.  3. The mitral valve is normal in structure. Mild mitral valve  regurgitation. No evidence of mitral stenosis.  4. The aortic valve has been repaired/replaced. Aortic valve  regurgitation is not visualized. No aortic stenosis is present. There is a  Inspra #25 valve present in the aortic position. Procedure Date:  05/21/2019. Echo findings are consistent with normal  structure and function of the aortic valve prosthesis.  5. There is moderate dilatation of the aortic root measuring 43 mm.  6. The inferior vena cava is normal in size with greater than 50%  respiratory variability, suggesting right atrial pressure of 3 mmHg  Recent Labs: 06/18/2019: TSH 15.100 07/31/2019: BUN 21; Creatinine, Ser 0.99; Magnesium 1.9; Potassium 5.1; Sodium 144 11/27/2019: Hemoglobin 12.6; Platelets 197  Recent Lipid Panel    Component Value Date/Time   CHOL 109 07/31/2019 0855   TRIG 80 07/31/2019 0855   HDL 33 (L) 07/31/2019 0855   CHOLHDL 3.3 07/31/2019 0855   LDLCALC 60 07/31/2019 0855    Physical Exam:    VS:  BP 110/70 (BP Location: Right Arm, Patient Position: Sitting, Cuff Size: Normal)   Pulse 84   Ht  (1.651 m)   Wt 152 lb (68.9 kg)   SpO2 94%   BMI 25.29 kg/m     Wt Readings from Last 3 Encounters:  06/11/20 152 lb (68.9 kg)  06/08/20 151 lb (68.5 kg)  04/27/20 144 lb 3.2 oz (65.4 kg)     GEN: Well nourished, well developed in no acute distress HEENT: Normal NECK: No JVD; No carotid bruits LYMPHATICS: No lymphadenopathy CARDIAC: S1S2 noted,RRR, no murmurs, rubs, gallops RESPIRATORY:  Clear to  auscultation without rales, wheezing or rhonchi  ABDOMEN: Soft, non-tender, non-distended, +bowel sounds, no guarding. EXTREMITIES: No edema, No cyanosis, no clubbing MUSCULOSKELETAL:  No deformity  SKIN: Warm and dry NEUROLOGIC:  Alert and oriented x 3, non-focal PSYCHIATRIC:  Normal affect, good insight  ASSESSMENT:    1. NSVT (nonsustained ventricular tachycardia) (HCC)   2. PAT (paroxysmal atrial tachycardia) (HCC)   3. Essential hypertension   4. Coronary artery disease involving native coronary artery of native heart with angina pectoris (HCC)   5. S/P aortic valve replacement with bioprosthetic valve    6. S/P CABG x 4   7. Tobacco abuse   8. Hyperlipidemia LDL goal <70   9. Stage 3a chronic kidney disease    PLAN:     His chest pain is concerning for angina given the patient does have history of coronary artery disease and is high risk for progression on like to pursue a cardiac catheterization in this patient.  In addition his monitor showed evidence of multiple episodes of nonsustained ventricular tachycardia.  Also restart the patient on his aspirin 81 mg daily, he will continue his Plavix 5 mg daily.  He will stay on his Lipitor for now.  We have discussed cardiac catheterization which I would like to schedule in the office today.  But the patient and his wife would like to speak to his sister who drives him to [facility] to make sure that this time works for her.  She will call our office tomorrow with date and time.  The patient understands that risks include but are not limited to stroke (1 in 1000), death (1 in 1000), kidney failure [usually temporary] (1 in 500), bleeding (1 in 200), allergic reaction [  possibly serious] (1 in 200), and agrees to proceed.  Sublingual nitroglycerin prescription was sent, its protocol and 911 protocol explained and the patient vocalized understanding questions were answered to the patient's satisfaction  In terms of the nonsustained  ventricular tachycardia and 78 episodes were paroxysmal atrial tachycardia I like to place the patient on low-dose beta-blocker.  I am going to stop losartan as he does have marginal blood pressure and start Toprol-XL 12.5 mg daily.    The patient is in agreement with the above plan. The patient left the office in stable condition.  The patient will follow up in   Medication Adjustments/Labs and Tests Ordered: Current medicines are reviewed at length with the patient today.  Concerns regarding medicines are outlined above.  Orders Placed This Encounter  Procedures  . CBC  . Basic metabolic panel   Meds ordered this encounter  Medications  . nitroGLYCERIN (NITROSTAT) 0.4 MG SL tablet    Sig: Place 1 tablet (0.4 mg total) under the tongue every 5 (five) minutes as needed for chest pain.    Dispense:  90 tablet    Refill:  3  . aspirin EC 81 MG tablet    Sig: Take 1 tablet (81 mg total) by mouth daily. Swallow whole.    Dispense:  90 tablet    Refill:  3  . metoprolol succinate (TOPROL XL) 25 MG 24 hr tablet    Sig: Take 0.5 tablets (12.5 mg total) by mouth daily.    Dispense:  45 tablet    Refill:  3    Patient Instructions  Medication Instructions:  Your physician has recommended you make the following change in your medication:  STOP: Losartan START: TOPROL XL 12.5 mg take 0.5 tablet by mouth daily.  START: Nitroglycerin 0.4 mg take one tablet by mouth every 5 minutes as needed for chest pain.  *If you need a refill on your cardiac medications before your next appointment, please call your pharmacy*   Lab Work: Your physician recommends that you return for lab work in: TODAY CBC, BMP If you have labs (blood work) drawn today and your tests are completely normal, you will receive your results only by: [name] MyChart Message (if you have MyChart) OR . A paper copy in the mail If you have any lab test that is abnormal or we need to change your treatment, we will call you to  review the results.   Testing/Procedures:    [facility] CARDIOVASCULAR DIVISION [facility] AT  [address] [place] [address] Dept: [phone] Loc: [phone]  Hargis Vandyne  06/11/2020  You are scheduled for a Cardiac Catheterization on , ____with Dr. _____.  1. Please arrive at the [facility] (Main Entrance A) at [facility]: [address] [place], [place] [address] at _____ (This time is two hours before your procedure to ensure your preparation). Free valet parking service is available.   Special note: Every effort is made to have your procedure done on time. Please understand that emergencies sometimes delay scheduled procedures.  2. Diet: Do not eat solid foods after midnight.  The patient may have clear liquids until 5am upon the day of the procedure.  3. Labs: You will need to have blood drawn on TODAY  4. Medication instructions in preparation for your procedure:   Contrast Allergy: No  On the morning of your procedure, take your Aspirin and any morning medicines NOT listed above.  You may use sips  of water.  5. Plan for one night stay--bring personal belongings. 6. Bring a current list of your medications and current insurance cards. 7. You MUST have a responsible person to drive you home. 8. Someone MUST be with you the first 24 hours after you arrive home or your discharge will be delayed. 9. Please wear clothes that are easy to get on and off and wear slip-on shoes.  Thank you for allowing [place] to care for you!   -- [facility]    Follow-Up: At [facility], you and your health needs are our priority.  As part of our continuing mission to provide you with exceptional heart care, we have created designated Provider Care Teams.  These Care Teams include your primary Cardiologist (physician) and Advanced Practice Providers (APPs -  Physician Assistants and Nurse  Practitioners) who all work together to provide you with the care you need, when you need it.  We recommend signing up for the patient portal called "MyChart".  Sign up information is provided on this After Visit Summary.  MyChart is used to connect with patients for Virtual Visits (Telemedicine).  Patients are able to view lab/test results, encounter notes, upcoming appointments, etc.  Non-urgent messages can be sent to your provider as well.   To learn more about what you can do with MyChart, go to [email or web address].    Your next appointment:   3 week(s)  The format for your next appointment:   In Person  Provider:   [name], DO   Other Instructions      Adopting a Healthy Lifestyle.  Know what a healthy weight is for you (roughly BMI <25) and aim to maintain this   Aim for 7+ servings of fruits and vegetables daily   65-80+ fluid ounces of water or unsweet tea for healthy kidneys   Limit to max 1 drink of alcohol per day; avoid smoking/tobacco   Limit animal fats in diet for cholesterol and heart health - choose grass fed whenever available   Avoid highly processed foods, and foods high in saturated/trans fats   Aim for low stress - take time to unwind and care for your mental health   Aim for 150 min of moderate intensity exercise weekly for heart health, and weights twice weekly for bone health   Aim for 7-9 hours of sleep daily   When it comes to diets, agreement about the perfect plan isnt easy to find, even among the experts. Experts at the [facility] [employer] developed an idea known as the Healthy Eating Plate. Just imagine a plate divided into logical, healthy portions.   The emphasis is on diet quality:   Load up on vegetables and fruits - one-half of your plate: Aim for color and variety, and remember that potatoes dont count.   Go for whole grains - one-quarter of your plate: Whole wheat, barley, wheat berries, quinoa, oats, brown  rice, and foods made with them. If you want pasta, go with whole wheat pasta.   Protein power - one-quarter of your plate: Fish, chicken, beans, and nuts are all healthy, versatile protein sources. Limit red meat.   The diet, however, does go beyond the plate, offering a few other suggestions.   Use healthy plant oils, such as olive, canola, soy, corn, sunflower and peanut. Check the labels, and avoid partially hydrogenated oil, which have unhealthy trans fats.   If youre thirsty, drink water. Coffee and tea are good in moderation,  but skip sugary drinks and limit milk and dairy products to one or two daily servings.   The type of carbohydrate in the diet is more important than the amount. Some sources of carbohydrates, such as vegetables, fruits, whole grains, and beans-are healthier than others.   Finally, stay active  Signed, [name], DO  06/11/2020 4:28 PM    [facility]

## 2020-06-11 NOTE — Progress Notes (Signed)
Cardiology Office Note:    Date:  06/11/2020   ID:  Dennis Gomez, DOB 01/28/1951, MRN [id number]  PCP:  Burgart, Jennifer, MD  Cardiologist:  Channa Hazelett, DO  Electrophysiologist:  None   Referring MD: Burgart, Jennifer, MD   Chief Complaint  Patient presents with  . Follow-up    Monitor    History of Present Illness:    Dennis Gomezis a 69 y.o.malewith a hx of COPD, tobacco abuse(quit), hypothroidism,HTN, acute CHF/LV dysfunction (EF now 60-65% 08/2019- improvement from 35-40%), moderate-severe aortic regurgitation s/p AVRwith inspiris 25mm bioprosthesis, CAD s/p CABGX4presents for follow up.  I last saw the patient January 31, 2020 at that time he appeared to be stable from a cardiovascular standpoint he had resolved his nosebleed.  We will continue his medication including aspirin statin and recommended follow-up in 6 months.  In the interim   Per records on May 14 he was doing work when he suddenly experienced loss of vision in his right eye.  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  The etiology was unknown but there was concern about cryptogenic stroke as well.  He was discharged on aspirin and Plavix for 30 days at which time he was asked to stop the aspirin continue Plavix.  He also kept on his Lipitor to milligrams daily.  I saw the patient April 27, 2020 at that time giving his interim report of being hospitalized at [facility] and questionable PFO I had the patient get a echo with bubble study.  Also at the time discussed with the patient he had a TEE doing his valve surgery and it did not show any PFO.  He was able to get his transthoracic echocardiogram done with continued saline study and this was negative for PFO.  In addition due to the concern for cryptogenic stroke a monitor was placed on the patient.  The patient is here  today for follow-up visit he tells me that he has been experiencing intermittent chest pain.  He described as a midsternal chest pressure-like sensation.  Lasts a few minutes.  He is a does not matter what it happens when he is sitting down or doing activity.  The shortness of breath does happen mostly with activity and then comes another wave of chest tightness.  This has been getting progressively worse.  He is concerned about this.  Past Medical History:  Diagnosis Date  . Acute kidney injury (HCC) 05/17/2019  . Aortic regurgitation   . Chest pain 05/16/2019  . Chronic combined systolic and diastolic CHF (congestive heart failure) (HCC)    a. LVEF 35-40% at [place] in 04/2019, 45-50% at [facility] pre-AVR. 25-30% by repeat echo 05/2019.  . Chronic kidney disease (CKD), stage III (moderate)   . Chronic systolic congestive heart failure (HCC)   . COPD (chronic obstructive pulmonary disease) (HCC)   . Coronary artery disease    a. s/p CABGx4 in 05/2019.  . Essential hypertension   . Hyperlipidemia LDL goal <70   . Hypothyroid   . Hypothyroidism 05/16/2019  . PAD (peripheral artery disease) (HCC)    a. R/L ABIs indicated mild lower extremity arterial disease and carotid duplex showed 1-39% stenosis.  . Postoperative atrial fibrillation (HCC)   . S/P aortic valve replacement with bioprosthetic valve 05/21/2019   25 mm Edwards Inspiris Resilia stented bovine pericardial tissue valve  . S/P CABG x 4   05/21/2019   LIMA to LAD, SVG to D1, SVG to OM, SVG to LPDA with EVH via right thigh and leg  . Severe aortic regurgitation   . Syncope 06/17/2019  . Tobacco abuse     Past Surgical History:  Procedure Laterality Date  . AORTIC VALVE REPLACEMENT N/A 05/21/2019   Procedure: AORTIC VALVE REPLACEMENT (AVR) using Inspiris Valve size 25mm;  Surgeon: Owen, [name], MD;  Location: [facility] OR;  Service: Open Heart Surgery;  Laterality: N/A;  . CORONARY ARTERY BYPASS GRAFT N/A 05/21/2019   Procedure: CORONARY ARTERY  BYPASS GRAFTING (CABG) x Four , using left internal mammary artery and right leg greater saphenous vein harvested endoscopically;  Surgeon: Owen, [name], MD;  Location: [facility] OR;  Service: Open Heart Surgery;  Laterality: N/A;  . RIGHT/LEFT HEART CATH AND CORONARY ANGIOGRAPHY N/A 05/17/2019   Procedure: RIGHT/LEFT HEART CATH AND CORONARY ANGIOGRAPHY;  Surgeon: Cooper, Michael, MD;  Location: [facility] INVASIVE CV LAB;  Service: Cardiovascular;  Laterality: N/A;  . TEE WITHOUT CARDIOVERSION N/A 05/21/2019   Procedure: TRANSESOPHAGEAL ECHOCARDIOGRAM (TEE);  Surgeon: Owen, [name], MD;  Location: [facility] OR;  Service: Open Heart Surgery;  Laterality: N/A;    Current Medications: Current Meds  Medication Sig  . atorvastatin (LIPITOR) 80 MG tablet Take 1 tablet (80 mg total) by mouth daily at 6 PM.  . clopidogrel (PLAVIX) 75 MG tablet Take 1 tablet by mouth daily.  . isosorbide mononitrate (IMDUR) 30 MG 24 hr tablet Take 0.5 tablets (15 mg total) by mouth daily.  . levothyroxine (SYNTHROID) 112 MCG tablet TAKE 1 TABLET DAILY IN THE MORNING ON AN EMPTY STOMACH FOR HYPOTHYROIDISM  . predniSONE (DELTASONE) 10 MG tablet Take 10 mg by mouth daily.  . [DISCONTINUED] losartan (COZAAR) 25 MG tablet Take 1 tablet (25 mg total) by mouth daily.     Allergies:   Hydrocodone   Social History   Socioeconomic History  . Marital status: Married    Spouse name: Not on file  . Number of children: Not on file  . Years of education: Not on file  . Highest education level: Not on file  Occupational History  . Not on file  Tobacco Use  . Smoking status: Former Smoker    Types: Cigarettes    Quit date: 05/17/2019    Years since quitting: 1.0  . Smokeless tobacco: Never Used  Substance and Sexual Activity  . Alcohol use: Not Currently  . Drug use: Not on file  . Sexual activity: Not on file  Other Topics Concern  . Not on file  Social History Narrative  . Not on file   Social Determinants of Health   Financial  Resource Strain:   . Difficulty of Paying Living Expenses: Not on file  Food Insecurity:   . Worried About Running Out of Food in the Last Year: Not on file  . Ran Out of Food in the Last Year: Not on file  Transportation Needs:   . Lack of Transportation (Medical): Not on file  . Lack of Transportation (Non-Medical): Not on file  Physical Activity:   . Days of Exercise per Week: Not on file  . Minutes of Exercise per Session: Not on file  Stress:   . Feeling of Stress : Not on file  Social Connections:   . Frequency of Communication with Friends and Family: Not on file  . Frequency of Social Gatherings with Friends and Family: Not on file  . Attends Religious Services: Not on file  .   Active Member of Clubs or Organizations: Not on file  . Attends Club or Organization Meetings: Not on file  . Marital Status: Not on file     Family History: The patient's family history includes COPD in his father; Cancer in his mother.  ROS:   Review of Systems  Constitution: Negative for decreased appetite, fever and weight gain.  HENT: Negative for congestion, ear discharge, hoarse voice and sore throat.   Eyes: Negative for discharge, redness, vision loss in right eye and visual halos.  Cardiovascular: Negative for chest pain, dyspnea on exertion, leg swelling, orthopnea and palpitations.  Respiratory: Negative for cough, hemoptysis, shortness of breath and snoring.   Endocrine: Negative for heat intolerance and polyphagia.  Hematologic/Lymphatic: Negative for bleeding problem. Does not bruise/bleed easily.  Skin: Negative for flushing, nail changes, rash and suspicious lesions.  Musculoskeletal: Negative for arthritis, joint pain, muscle cramps, myalgias, neck pain and stiffness.  Gastrointestinal: Negative for abdominal pain, bowel incontinence, diarrhea and excessive appetite.  Genitourinary: Negative for decreased libido, genital sores and incomplete emptying.  Neurological: Negative for  brief paralysis, focal weakness, headaches and loss of balance.  Psychiatric/Behavioral: Negative for altered mental status, depression and suicidal ideas.  Allergic/Immunologic: Negative for HIV exposure and persistent infections.    EKGs/Labs/Other Studies Reviewed:    The following studies were reviewed today:   EKG: None today  ZIo monitor The patient wore the monitor for 13 days 18 hours starting April 27, 2020. Indication: Palpitations  The minimum heart rate was 49 bpm, maximum heart rate was 255 bpm, and average heart rate was 78 bpm. Predominant underlying rhythm was Sinus Rhythm. Ectopic Atrial Rhythm was noted.  3 Ventricular Tachycardia runs occurred, the run with the fastest interval lasting 8 beats with a maximum rate of 255 bpm, the longest lasting 9 beats with an average rate of 145 bpm.   78 Supraventricular Tachycardia runs occurred, the run with the fastest interval lasting 6 beats with a max rate of 200 bpm, the longest lasting 16.5 second with an average rate of 140 bpm.   Premature atrial complexes  were rare (<1.0%). Premature Ventricular complexes were rare (<1.0%). Ventricular Bigeminy was present.  No pauses, No AV block and no atrial fibrillation present. 18 patient triggered events: 7 associated with premature atrial complex., 6 associated with ectopic atrial rhythm, remaining associated with sinus rhythm. 7 diary events noted 3 associated with ectopic atrial rhythm, 2 associated with premature atrial complex.  This study is remarkable for the following:                             1.  3 episodes of nonsustained ventricular tachycardia.                             2.  Paroxysmal atrial tachycardia with variable block.                             3.  Symptomatic ectopic atrial rhythm   TTE  IMPRESSIONS  1. Left ventricular ejection fraction, by estimation, is 60 to 65%. The  left ventricle has normal function. The left ventricle has no regional   wall motion abnormalities. There is mild concentric left ventricular  hypertrophy. Left ventricular diastolic  parameters are consistent with Grade II diastolic dysfunction  (pseudonormalization). Elevated left atrial pressure.  2. Right   ventricular systolic function is normal. The right ventricular  size is normal. There is normal pulmonary artery systolic pressure.  3. The mitral valve is normal in structure. Mild mitral valve  regurgitation. No evidence of mitral stenosis.  4. The aortic valve has been repaired/replaced. Aortic valve  regurgitation is not visualized. No aortic stenosis is present. There is a  Inspra #25 valve present in the aortic position. Procedure Date:  05/21/2019. Echo findings are consistent with normal  structure and function of the aortic valve prosthesis.  5. There is moderate dilatation of the aortic root measuring 43 mm.  6. The inferior vena cava is normal in size with greater than 50%  respiratory variability, suggesting right atrial pressure of 3 mmHg  Recent Labs: 06/18/2019: TSH 15.100 07/31/2019: BUN 21; Creatinine, Ser 0.99; Magnesium 1.9; Potassium 5.1; Sodium 144 11/27/2019: Hemoglobin 12.6; Platelets 197  Recent Lipid Panel    Component Value Date/Time   CHOL 109 07/31/2019 0855   TRIG 80 07/31/2019 0855   HDL 33 (L) 07/31/2019 0855   CHOLHDL 3.3 07/31/2019 0855   LDLCALC 60 07/31/2019 0855    Physical Exam:    VS:  BP 110/70 (BP Location: Right Arm, Patient Position: Sitting, Cuff Size: Normal)   Pulse 84   Ht 5' 5" (1.651 m)   Wt 152 lb (68.9 kg)   SpO2 94%   BMI 25.29 kg/m     Wt Readings from Last 3 Encounters:  06/11/20 152 lb (68.9 kg)  06/08/20 151 lb (68.5 kg)  04/27/20 144 lb 3.2 oz (65.4 kg)     GEN: Well nourished, well developed in no acute distress HEENT: Normal NECK: No JVD; No carotid bruits LYMPHATICS: No lymphadenopathy CARDIAC: S1S2 noted,RRR, no murmurs, rubs, gallops RESPIRATORY:  Clear to  auscultation without rales, wheezing or rhonchi  ABDOMEN: Soft, non-tender, non-distended, +bowel sounds, no guarding. EXTREMITIES: No edema, No cyanosis, no clubbing MUSCULOSKELETAL:  No deformity  SKIN: Warm and dry NEUROLOGIC:  Alert and oriented x 3, non-focal PSYCHIATRIC:  Normal affect, good insight  ASSESSMENT:    1. NSVT (nonsustained ventricular tachycardia) (HCC)   2. PAT (paroxysmal atrial tachycardia) (HCC)   3. Essential hypertension   4. Coronary artery disease involving native coronary artery of native heart with angina pectoris (HCC)   5. S/P aortic valve replacement with bioprosthetic valve    6. S/P CABG x 4   7. Tobacco abuse   8. Hyperlipidemia LDL goal <70   9. Stage 3a chronic kidney disease    PLAN:     His chest pain is concerning for angina given the patient does have history of coronary artery disease and is high risk for progression on like to pursue a cardiac catheterization in this patient.  In addition his monitor showed evidence of multiple episodes of nonsustained ventricular tachycardia.  Also restart the patient on his aspirin 81 mg daily, he will continue his Plavix 5 mg daily.  He will stay on his Lipitor for now.  We have discussed cardiac catheterization which I would like to schedule in the office today.  But the patient and his wife would like to speak to his sister who drives him to [place] to make sure that this time works for her.  She will call our office tomorrow with date and time.  The patient understands that risks include but are not limited to stroke (1 in 1000), death (1 in 1000), kidney failure [usually temporary] (1 in 500), bleeding (1 in 200), allergic reaction [  possibly serious] (1 in 200), and agrees to proceed.  Sublingual nitroglycerin prescription was sent, its protocol and 911 protocol explained and the patient vocalized understanding questions were answered to the patient's satisfaction  In terms of the nonsustained  ventricular tachycardia and 78 episodes were paroxysmal atrial tachycardia I like to place the patient on low-dose beta-blocker.  I am going to stop losartan as he does have marginal blood pressure and start Toprol-XL 12.5 mg daily.    The patient is in agreement with the above plan. The patient left the office in stable condition.  The patient will follow up in   Medication Adjustments/Labs and Tests Ordered: Current medicines are reviewed at length with the patient today.  Concerns regarding medicines are outlined above.  Orders Placed This Encounter  Procedures  . CBC  . Basic metabolic panel   Meds ordered this encounter  Medications  . nitroGLYCERIN (NITROSTAT) 0.4 MG SL tablet    Sig: Place 1 tablet (0.4 mg total) under the tongue every 5 (five) minutes as needed for chest pain.    Dispense:  90 tablet    Refill:  3  . aspirin EC 81 MG tablet    Sig: Take 1 tablet (81 mg total) by mouth daily. Swallow whole.    Dispense:  90 tablet    Refill:  3  . metoprolol succinate (TOPROL XL) 25 MG 24 hr tablet    Sig: Take 0.5 tablets (12.5 mg total) by mouth daily.    Dispense:  45 tablet    Refill:  3    Patient Instructions  Medication Instructions:  Your physician has recommended you make the following change in your medication:  STOP: Losartan START: TOPROL XL 12.5 mg take 0.5 tablet by mouth daily.  START: Nitroglycerin 0.4 mg take one tablet by mouth every 5 minutes as needed for chest pain.  *If you need a refill on your cardiac medications before your next appointment, please call your pharmacy*   Lab Work: Your physician recommends that you return for lab work in: TODAY CBC, BMP If you have labs (blood work) drawn today and your tests are completely normal, you will receive your results only by: . MyChart Message (if you have MyChart) OR . A paper copy in the mail If you have any lab test that is abnormal or we need to change your treatment, we will call you to  review the results.   Testing/Procedures:    [facility] [place] [address] [place] [address] Dept: [phone] Loc: [phone]  Kordell Sampey  06/11/2020  You are scheduled for a Cardiac Catheterization on , ____with Dr. _____.  1. Please arrive at the [facility] (Main Entrance A) at [facility]: [address] [place], [place] [address] at _____ (This time is two hours before your procedure to ensure your preparation). Free valet parking service is available.   Special note: Every effort is made to have your procedure done on time. Please understand that emergencies sometimes delay scheduled procedures.  2. Diet: Do not eat solid foods after midnight.  The patient may have clear liquids until 5am upon the day of the procedure.  3. Labs: You will need to have blood drawn on TODAY  4. Medication instructions in preparation for your procedure:   Contrast Allergy: No  On the morning of your procedure, take your Aspirin and any morning medicines NOT listed above.  You may use sips   of water.  5. Plan for one night stay--bring personal belongings. 6. Bring a current list of your medications and current insurance cards. 7. You MUST have a responsible person to drive you home. 8. Someone MUST be with you the first 24 hours after you arrive home or your discharge will be delayed. 9. Please wear clothes that are easy to get on and off and wear slip-on shoes.  Thank you for allowing us to care for you!   --  Invasive Cardiovascular services    Follow-Up: At [facility], you and your health needs are our priority.  As part of our continuing mission to provide you with exceptional heart care, we have created designated Provider Care Teams.  These Care Teams include your primary Cardiologist (physician) and Advanced Practice Providers (APPs -  Physician Assistants and Nurse  Practitioners) who all work together to provide you with the care you need, when you need it.  We recommend signing up for the patient portal called "MyChart".  Sign up information is provided on this After Visit Summary.  MyChart is used to connect with patients for Virtual Visits (Telemedicine).  Patients are able to view lab/test results, encounter notes, upcoming appointments, etc.  Non-urgent messages can be sent to your provider as well.   To learn more about what you can do with MyChart, go to [email or web address].    Your next appointment:   3 week(s)  The format for your next appointment:   In Person  Provider:   Twilia Yaklin, DO   Other Instructions      Adopting a Healthy Lifestyle.  Know what a healthy weight is for you (roughly BMI <25) and aim to maintain this   Aim for 7+ servings of fruits and vegetables daily   65-80+ fluid ounces of water or unsweet tea for healthy kidneys   Limit to max 1 drink of alcohol per day; avoid smoking/tobacco   Limit animal fats in diet for cholesterol and heart health - choose grass fed whenever available   Avoid highly processed foods, and foods high in saturated/trans fats   Aim for low stress - take time to unwind and care for your mental health   Aim for 150 min of moderate intensity exercise weekly for heart health, and weights twice weekly for bone health   Aim for 7-9 hours of sleep daily   When it comes to diets, agreement about the perfect plan isnt easy to find, even among the experts. Experts at the [employer] developed an idea known as the Healthy Eating Plate. Just imagine a plate divided into logical, healthy portions.   The emphasis is on diet quality:   Load up on vegetables and fruits - one-half of your plate: Aim for color and variety, and remember that potatoes dont count.   Go for whole grains - one-quarter of your plate: Whole wheat, barley, wheat berries, quinoa, oats, brown  rice, and foods made with them. If you want pasta, go with whole wheat pasta.   Protein power - one-quarter of your plate: Fish, chicken, beans, and nuts are all healthy, versatile protein sources. Limit red meat.   The diet, however, does go beyond the plate, offering a few other suggestions.   Use healthy plant oils, such as olive, canola, soy, corn, sunflower and peanut. Check the labels, and avoid partially hydrogenated oil, which have unhealthy trans fats.   If youre thirsty, drink water. Coffee and tea are good in moderation,   but skip sugary drinks and limit milk and dairy products to one or two daily servings.   The type of carbohydrate in the diet is more important than the amount. Some sources of carbohydrates, such as vegetables, fruits, whole grains, and beans-are healthier than others.   Finally, stay active  Signed, Adrienne Trombetta, DO  06/11/2020 4:28 PM    [facility] 

## 2020-06-12 ENCOUNTER — Telehealth: Payer: Self-pay | Admitting: Cardiology

## 2020-06-12 LAB — CBC
Hematocrit: 42.6 % (ref 37.5–51.0)
Hemoglobin: 14 g/dL (ref 13.0–17.7)
MCH: 29.4 pg (ref 26.6–33.0)
MCHC: 32.9 g/dL (ref 31.5–35.7)
MCV: 89 fL (ref 79–97)
Platelets: 161 10*3/uL (ref 150–450)
RBC: 4.77 x10E6/uL (ref 4.14–5.80)
RDW: 15.6 % — ABNORMAL HIGH (ref 11.6–15.4)
WBC: 7.5 10*3/uL (ref 3.4–10.8)

## 2020-06-12 LAB — BASIC METABOLIC PANEL
BUN/Creatinine Ratio: 17 (ref 10–24)
BUN: 15 mg/dL (ref 8–27)
CO2: 22 mmol/L (ref 20–29)
Calcium: 9.1 mg/dL (ref 8.6–10.2)
Chloride: 104 mmol/L (ref 96–106)
Creatinine, Ser: 0.89 mg/dL (ref 0.76–1.27)
GFR calc Af Amer: 103 mL/min/{1.73_m2} (ref >59)
GFR calc non Af Amer: 89 mL/min/{1.73_m2} (ref >59)
Glucose: 136 mg/dL — ABNORMAL HIGH (ref 65–99)
Potassium: 4.3 mmol/L (ref 3.5–5.2)
Sodium: 139 mmol/L (ref 134–144)

## 2020-06-12 NOTE — Telephone Encounter (Signed)
Spoke to patient and patients wife just now and let them know that I got his cath scheduled for September 7th and they will need to arrive at 5:30 AM. I also let them know that his COVID test was scheduled for 06/20/20 at 9:50 AM. The address was given for this COVID test. No other issues or concerns were noted at this time. They verbalize understanding of these instructions.

## 2020-06-12 NOTE — Telephone Encounter (Signed)
Spoke to the patient just now and he let me know that he could be available for the cardiac cath on Monday. Unfortunately the cath lab is fully booked on Monday at this point so I advised that he would need to pick a different day. He states that he will discuss this with his wife and they will call us back to let us know.    Encouraged patient to call back with any questions or concerns.

## 2020-06-12 NOTE — Telephone Encounter (Signed)
Patient's wife calling back. °

## 2020-06-12 NOTE — Telephone Encounter (Signed)
Patient's wife called stating her husband needs to be set-up for COVID screening so he can have his procedure done for a stent.

## 2020-06-13 IMAGING — CR CHEST - 2 VIEW
2 series · 2 of 2 positions shown · non-contrast
Comparison: 05/14/2019 radiographs and CTA.

CLINICAL DATA: 67-year-old male with recent chest pain. Productive
cough.

EXAM:
CHEST - 2 VIEW

[chest ap]
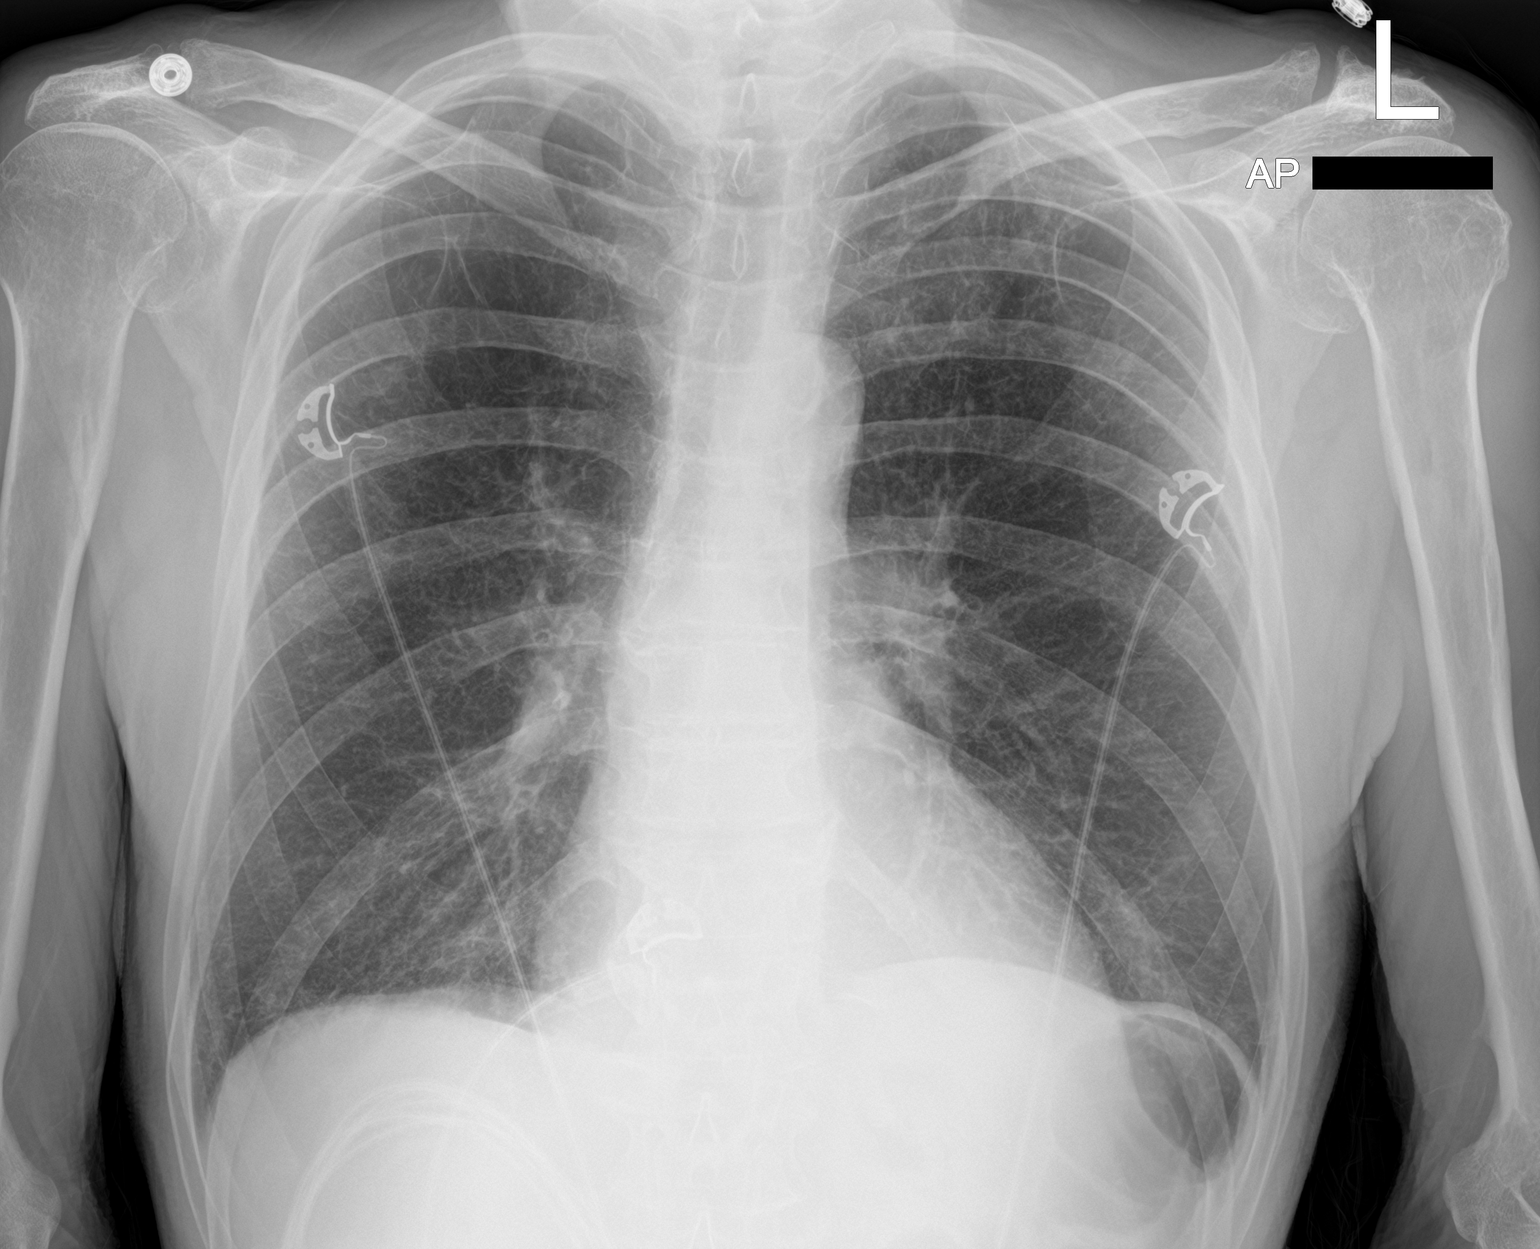

[chest lat]
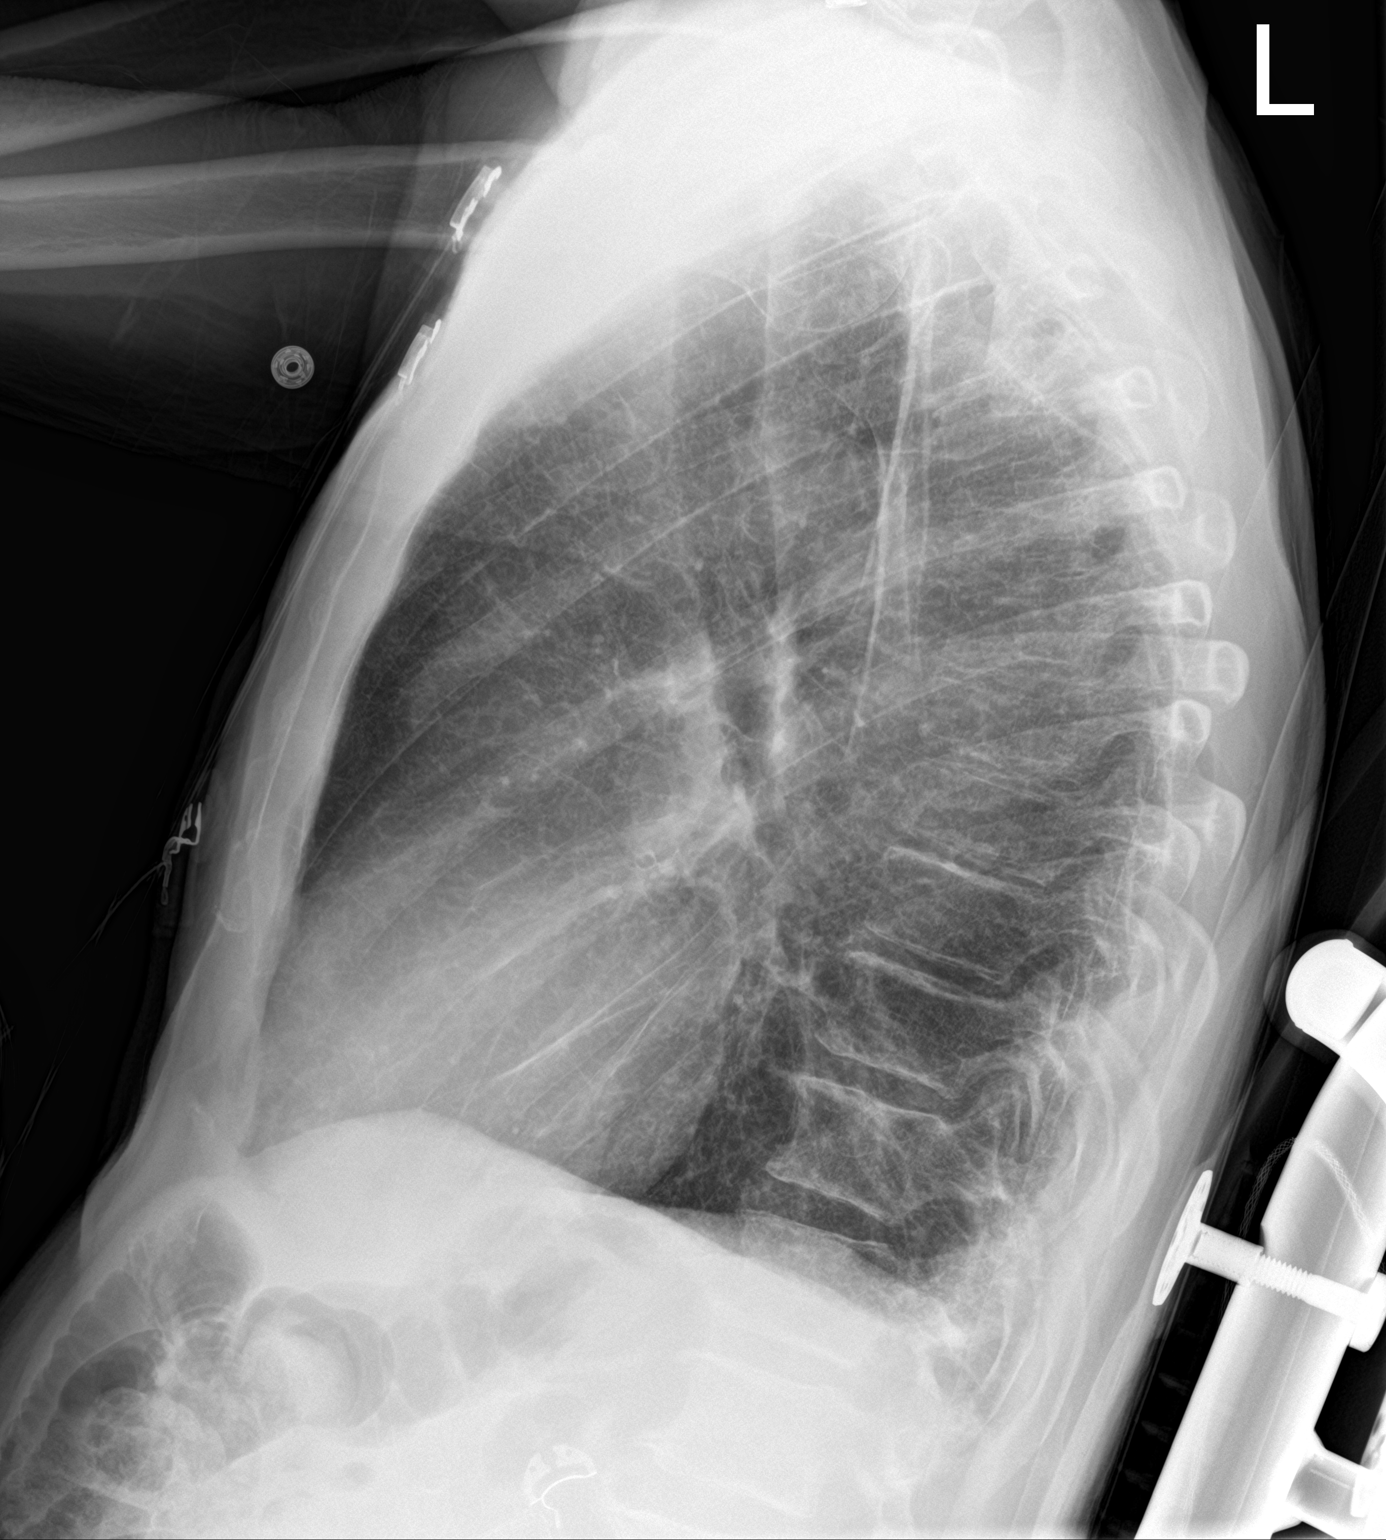

[2 of 2 positions shown; findings below may reference images not displayed]

FINDINGS: Stable lung volumes and mediastinal contours. Visualized tracheal
air column is within normal limits. No pneumothorax, pulmonary edema
or definite pleural effusion. No confluent pulmonary opacity.
Osteopenia. No acute osseous abnormality identified. Negative
visible bowel gas pattern.
IMPRESSION: No acute cardiopulmonary abnormality.

## 2020-06-17 IMAGING — DX PORTABLE CHEST - 1 VIEW
1 series · 1 of 1 positions shown · non-contrast
Comparison: May 21, 2019

CLINICAL DATA: 67-year-old male with a history of cardiac surgery

EXAM:
PORTABLE CHEST 1 VIEW

[chest ap]
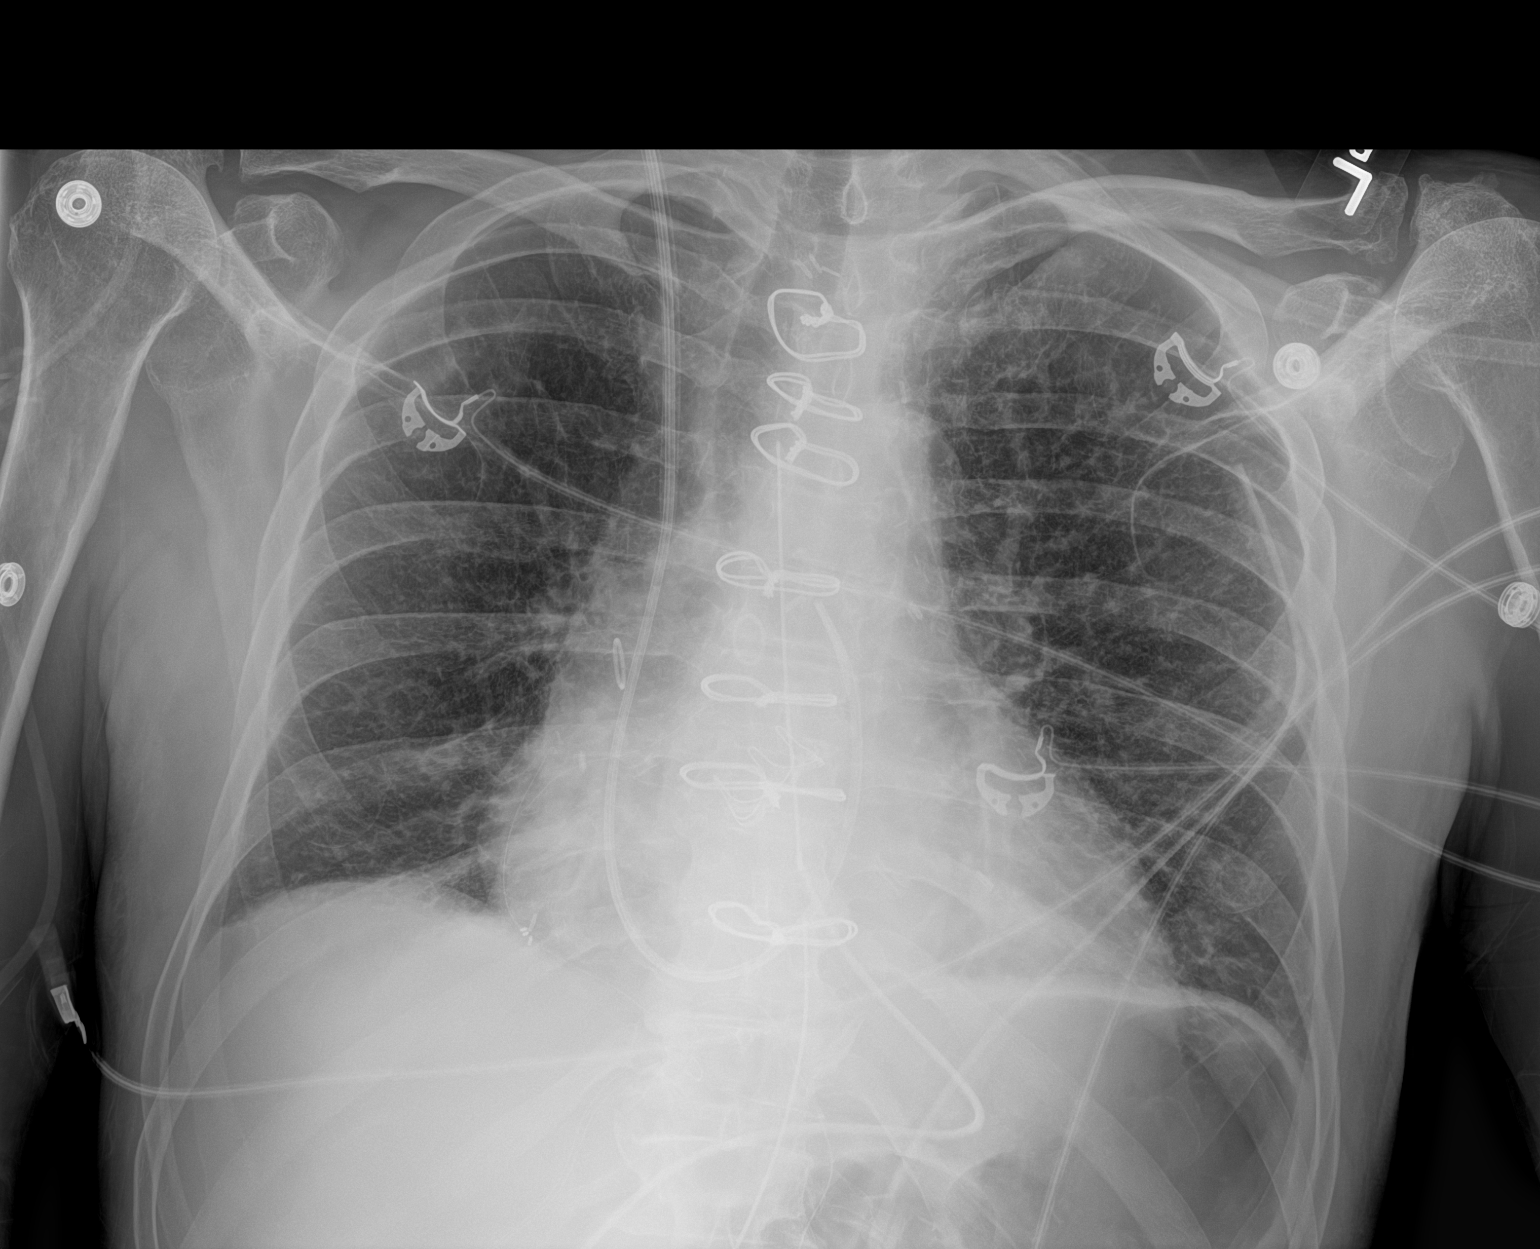

[1 of 1 positions shown; findings below may reference images not displayed]

FINDINGS: Cardiomediastinal silhouette unchanged with surgical changes of
median sternotomy, CABG, aortic valve repair.

Interval removal of the endotracheal tube and gastric tube.

Unchanged position of right IJ sheath which transmits Swan-Ganz
catheter, with the tip terminating in the region of the main
pulmonary artery.

Mediastinal/pleural drains unchanged. Epicardial pacing leads in
place.

No visualized pneumothorax.  No pleural effusion.

Low lung volumes with reticular opacities/architectural distortion
of the lungs and no evidence of interlobular septal thickening or
confluent airspace disease.
IMPRESSION: Interval extubation and removal of the gastric tube.

Unchanged mediastinal/pleural drains.  No visualized pneumothorax.

Low lung volumes and chronic lung changes.

Right IJ sheath and Swan-Ganz catheter unchanged.

Surgical changes of median sternotomy, CABG, aortic valve repair.

## 2020-06-18 ENCOUNTER — Telehealth: Payer: Self-pay | Admitting: *Deleted

## 2020-06-18 NOTE — Telephone Encounter (Signed)
Pt contacted pre-catheterization scheduled at [facility] for: Tuesday June 23, 2020 7:30 AM Verified arrival time and place: [facility] Main Entrance A [facility]) at: 5:30 AM   No solid food after midnight prior to cath, clear liquids until 5 AM day of procedure.  AM meds can be  taken pre-cath with sips of water including: ASA 81 mg Plavix 75 mg  Confirmed patient has responsible adult to drive home post procedure and be with patient first 24 hours after arriving home: yes  You are allowed ONE visitor in the waiting room during the time you are at the hospital for your procedure. Both you and your visitor must wear a mask once you enter the hospital.       COVID-19 Pre-Screening Questions:  . In the past 10 days have you had a new cough, shortness of breath, headache, congestion, fever (100 or greater) unexplained body aches, new sore throat, or sudden loss of taste or sense of smell? no . In the past 10 days have you been around anyone with known Covid 19? no   Reviewed procedure/mask/visitor instructions, COVID-19 questions reviewed with patient's wife (DPR), Corrie Dandy.

## 2020-06-20 ENCOUNTER — Other Ambulatory Visit (HOSPITAL_COMMUNITY)
Admission: RE | Admit: 2020-06-20 | Discharge: 2020-06-20 | Disposition: A | Discharge status: Home / Self Care | Payer: Medicare Other | Source: Ambulatory Visit | Attending: Cardiology | Admitting: Cardiology

## 2020-06-20 DIAGNOSIS — Z01812 Encounter for preprocedural laboratory examination: Secondary | ICD-10-CM | POA: Insufficient documentation

## 2020-06-20 DIAGNOSIS — Z20822 Contact with and (suspected) exposure to covid-19: Secondary | ICD-10-CM | POA: Insufficient documentation

## 2020-06-20 LAB — SARS CORONAVIRUS 2 (TAT 6-24 HRS): SARS Coronavirus 2: NEGATIVE

## 2020-06-23 ENCOUNTER — Encounter (HOSPITAL_COMMUNITY)
Admission: RE | Disposition: A | Discharge status: Home / Self Care | Payer: Self-pay | Source: Home / Self Care | Attending: Cardiology

## 2020-06-23 ENCOUNTER — Encounter (HOSPITAL_COMMUNITY): Payer: Self-pay | Admitting: Cardiology

## 2020-06-23 ENCOUNTER — Other Ambulatory Visit: Payer: Self-pay

## 2020-06-23 ENCOUNTER — Ambulatory Visit (HOSPITAL_COMMUNITY)
Admission: RE | Admit: 2020-06-23 | Discharge: 2020-06-23 | Disposition: A | Discharge status: Home / Self Care | Payer: Medicare Other | Attending: Cardiology | Admitting: Cardiology

## 2020-06-23 DIAGNOSIS — I5042 Chronic combined systolic (congestive) and diastolic (congestive) heart failure: Secondary | ICD-10-CM | POA: Insufficient documentation

## 2020-06-23 DIAGNOSIS — Z7902 Long term (current) use of antithrombotics/antiplatelets: Secondary | ICD-10-CM | POA: Diagnosis not present

## 2020-06-23 DIAGNOSIS — E039 Hypothyroidism, unspecified: Secondary | ICD-10-CM | POA: Diagnosis not present

## 2020-06-23 DIAGNOSIS — E785 Hyperlipidemia, unspecified: Secondary | ICD-10-CM | POA: Insufficient documentation

## 2020-06-23 DIAGNOSIS — Z952 Presence of prosthetic heart valve: Secondary | ICD-10-CM | POA: Diagnosis not present

## 2020-06-23 DIAGNOSIS — J449 Chronic obstructive pulmonary disease, unspecified: Secondary | ICD-10-CM | POA: Diagnosis not present

## 2020-06-23 DIAGNOSIS — I10 Essential (primary) hypertension: Secondary | ICD-10-CM | POA: Diagnosis present

## 2020-06-23 DIAGNOSIS — I471 Supraventricular tachycardia: Secondary | ICD-10-CM | POA: Diagnosis not present

## 2020-06-23 DIAGNOSIS — I351 Nonrheumatic aortic (valve) insufficiency: Secondary | ICD-10-CM | POA: Diagnosis not present

## 2020-06-23 DIAGNOSIS — I25719 Atherosclerosis of autologous vein coronary artery bypass graft(s) with unspecified angina pectoris: Secondary | ICD-10-CM

## 2020-06-23 DIAGNOSIS — I130 Hypertensive heart and chronic kidney disease with heart failure and stage 1 through stage 4 chronic kidney disease, or unspecified chronic kidney disease: Secondary | ICD-10-CM | POA: Diagnosis not present

## 2020-06-23 DIAGNOSIS — I739 Peripheral vascular disease, unspecified: Secondary | ICD-10-CM | POA: Diagnosis not present

## 2020-06-23 DIAGNOSIS — Z720 Tobacco use: Secondary | ICD-10-CM | POA: Diagnosis present

## 2020-06-23 DIAGNOSIS — Z79899 Other long term (current) drug therapy: Secondary | ICD-10-CM | POA: Diagnosis not present

## 2020-06-23 DIAGNOSIS — Z87891 Personal history of nicotine dependence: Secondary | ICD-10-CM | POA: Insufficient documentation

## 2020-06-23 DIAGNOSIS — N1831 Chronic kidney disease, stage 3a: Secondary | ICD-10-CM | POA: Diagnosis not present

## 2020-06-23 DIAGNOSIS — Z953 Presence of xenogenic heart valve: Secondary | ICD-10-CM

## 2020-06-23 DIAGNOSIS — I25709 Atherosclerosis of coronary artery bypass graft(s), unspecified, with unspecified angina pectoris: Secondary | ICD-10-CM | POA: Insufficient documentation

## 2020-06-23 DIAGNOSIS — Z79890 Hormone replacement therapy (postmenopausal): Secondary | ICD-10-CM | POA: Diagnosis not present

## 2020-06-23 DIAGNOSIS — Z951 Presence of aortocoronary bypass graft: Secondary | ICD-10-CM | POA: Diagnosis not present

## 2020-06-23 DIAGNOSIS — N183 Chronic kidney disease, stage 3 unspecified: Secondary | ICD-10-CM | POA: Diagnosis present

## 2020-06-23 DIAGNOSIS — I25119 Atherosclerotic heart disease of native coronary artery with unspecified angina pectoris: Secondary | ICD-10-CM | POA: Diagnosis not present

## 2020-06-23 HISTORY — PX: CORONARY/GRAFT ANGIOGRAPHY: CATH118237

## 2020-06-23 SURGERY — CORONARY/GRAFT ANGIOGRAPHY
Anesthesia: LOCAL

## 2020-06-23 MED ORDER — HEPARIN SODIUM (PORCINE) 1000 UNIT/ML IJ SOLN
INTRAMUSCULAR | Status: AC
  Filled 2020-06-23: qty 1

## 2020-06-23 MED ORDER — ONDANSETRON HCL 4 MG/2ML IJ SOLN: 4.0000 mg | Freq: Four times a day (QID) | INTRAMUSCULAR | Status: DC | PRN

## 2020-06-23 MED ORDER — IOHEXOL 350 MG/ML SOLN
INTRAVENOUS | Status: DC | PRN
  Administered 2020-06-23: 190 mL

## 2020-06-23 MED ORDER — VERAPAMIL HCL 2.5 MG/ML IV SOLN
INTRAVENOUS | Status: AC
  Filled 2020-06-23: qty 2

## 2020-06-23 MED ORDER — HYDRALAZINE HCL 20 MG/ML IJ SOLN
10.0000 mg | INTRAMUSCULAR | Status: DC | PRN
  Administered 2020-06-23 (×2): 10 mg via INTRAVENOUS

## 2020-06-23 MED ORDER — ACETAMINOPHEN 325 MG PO TABS: 650.0000 mg | ORAL_TABLET | ORAL | Status: DC | PRN

## 2020-06-23 MED ORDER — ASPIRIN 81 MG PO CHEW: 81.0000 mg | CHEWABLE_TABLET | ORAL | Status: DC

## 2020-06-23 MED ORDER — HEPARIN (PORCINE) IN NACL 1000-0.9 UT/500ML-% IV SOLN
INTRAVENOUS | Status: AC
  Filled 2020-06-23: qty 1000

## 2020-06-23 MED ORDER — CLOPIDOGREL BISULFATE 75 MG PO TABS: 75.0000 mg | ORAL_TABLET | ORAL | Status: DC

## 2020-06-23 MED ORDER — FENTANYL CITRATE (PF) 100 MCG/2ML IJ SOLN
INTRAMUSCULAR | Status: AC
  Filled 2020-06-23: qty 2

## 2020-06-23 MED ORDER — SODIUM CHLORIDE 0.9% FLUSH: 3.0000 mL | Freq: Two times a day (BID) | INTRAVENOUS | Status: DC

## 2020-06-23 MED ORDER — MIDAZOLAM HCL 2 MG/2ML IJ SOLN
INTRAMUSCULAR | Status: AC
  Filled 2020-06-23: qty 2

## 2020-06-23 MED ORDER — SODIUM CHLORIDE 0.9% FLUSH: 3.0000 mL | INTRAVENOUS | Status: DC | PRN

## 2020-06-23 MED ORDER — HYDRALAZINE HCL 20 MG/ML IJ SOLN
INTRAMUSCULAR | Status: AC
  Filled 2020-06-23: qty 1

## 2020-06-23 MED ORDER — MIDAZOLAM HCL 2 MG/2ML IJ SOLN
INTRAMUSCULAR | Status: DC | PRN
  Administered 2020-06-23: 1 mg via INTRAVENOUS

## 2020-06-23 MED ORDER — SODIUM CHLORIDE 0.9 % WEIGHT BASED INFUSION
3.0000 mL/kg/h | INTRAVENOUS | Status: AC
  Administered 2020-06-23: 3 mL/kg/h via INTRAVENOUS

## 2020-06-23 MED ORDER — VERAPAMIL HCL 2.5 MG/ML IV SOLN
INTRAVENOUS | Status: DC | PRN
  Administered 2020-06-23: 10 mL via INTRA_ARTERIAL

## 2020-06-23 MED ORDER — LIDOCAINE HCL (PF) 1 % IJ SOLN
INTRAMUSCULAR | Status: AC
  Filled 2020-06-23: qty 30

## 2020-06-23 MED ORDER — IOHEXOL 350 MG/ML SOLN
INTRAVENOUS | Status: AC
  Filled 2020-06-23: qty 1

## 2020-06-23 MED ORDER — LIDOCAINE HCL (PF) 1 % IJ SOLN
INTRAMUSCULAR | Status: DC | PRN
  Administered 2020-06-23: 2 mL via INTRADERMAL

## 2020-06-23 MED ORDER — FENTANYL CITRATE (PF) 100 MCG/2ML IJ SOLN
INTRAMUSCULAR | Status: DC | PRN
Start: 2020-06-23 — End: 2020-06-23
  Administered 2020-06-23: 25 ug via INTRAVENOUS

## 2020-06-23 MED ORDER — SODIUM CHLORIDE 0.9 % IV SOLN: 250.0000 mL | INTRAVENOUS | Status: DC | PRN

## 2020-06-23 MED ORDER — SODIUM CHLORIDE 0.9 % WEIGHT BASED INFUSION: 1.0000 mL/kg/h | INTRAVENOUS | Status: DC

## 2020-06-23 MED ORDER — HEPARIN SODIUM (PORCINE) 1000 UNIT/ML IJ SOLN
INTRAMUSCULAR | Status: DC | PRN
  Administered 2020-06-23: 3500 [IU] via INTRAVENOUS

## 2020-06-23 MED ORDER — SODIUM CHLORIDE 0.9 % WEIGHT BASED INFUSION
1.0000 mL/kg/h | INTRAVENOUS | Status: DC
  Administered 2020-06-23: 1 mL/kg/h via INTRAVENOUS

## 2020-06-23 MED ORDER — HEPARIN (PORCINE) IN NACL 1000-0.9 UT/500ML-% IV SOLN
INTRAVENOUS | Status: DC | PRN
  Administered 2020-06-23 (×2): 500 mL

## 2020-06-23 SURGICAL SUPPLY — 14 items
CATH INFINITI 5 FR AL2 (CATHETERS) ×2 IMPLANT
CATH INFINITI 5 FR IM (CATHETERS) ×2 IMPLANT
CATH INFINITI 5 FR RCB (CATHETERS) ×2 IMPLANT
CATH INFINITI 5FR AL1 (CATHETERS) ×2 IMPLANT
CATH INFINITI 5FR MULTPACK ANG (CATHETERS) ×2 IMPLANT
DEVICE RAD COMP TR BAND LRG (VASCULAR PRODUCTS) ×2 IMPLANT
GLIDESHEATH SLEND SS 6F .021 (SHEATH) ×2 IMPLANT
GUIDEWIRE INQWIRE 1.5J.035X260 (WIRE) ×1 IMPLANT
INQWIRE 1.5J .035X260CM (WIRE) ×2
KIT HEART LEFT (KITS) ×2 IMPLANT
PACK CARDIAC CATHETERIZATION (CUSTOM PROCEDURE TRAY) ×2 IMPLANT
SYR MEDRAD MARK 7 150ML (SYRINGE) ×2 IMPLANT
TRANSDUCER W/STOPCOCK (MISCELLANEOUS) ×2 IMPLANT
TUBING CIL FLEX 10 FLL-RA (TUBING) ×2 IMPLANT

## 2020-06-23 NOTE — Interval H&P Note (Signed)
History and Physical Interval Note:  06/23/2020 7:17 AM  Dennis Gomez  has presented today for surgery, with the diagnosis of CAD.  The various methods of treatment have been discussed with the patient and family. After consideration of risks, benefits and other options for treatment, the patient has consented to  Procedure(s): LEFT HEART CATH AND CORS/GRAFTS ANGIOGRAPHY (N/A) as a surgical intervention.  The patient's history has been reviewed, patient examined, no change in status, stable for surgery.  I have reviewed the patient's chart and labs.  Questions were answered to the patient's satisfaction.   Cath Lab Visit (complete for each Cath Lab visit)  Clinical Evaluation Leading to the Procedure:   ACS: No.  Non-ACS:    Anginal Classification: CCS III  Anti-ischemic medical therapy: Minimal Therapy (1 class of medications)  Non-Invasive Test Results: No non-invasive testing performed  Prior CABG: Previous CABG        Theron Arista [facility] 06/23/2020 7:17 AM

## 2020-06-23 NOTE — Discharge Instructions (Signed)
PCI scheduled for 06/26/20 @ 9:00, arrive at Admitting at 6:30.  Be sure to quarantine until Friday.  Nothing to eat or drink after midnight on Thursday.  Take all morning medications on Friday morning.  Plan to have someone drive you home after procedure as well as stay with you overnight.   Drink plenty of fluids for 48 hours and keep wrist elevated at heart level for 24 hours  Radial Site Care   This sheet gives you information about how to care for yourself after your procedure. Your health care provider may also give you more specific instructions. If you have problems or questions, contact your health care provider. What can I expect after the procedure? After the procedure, it is common to have:  Bruising and tenderness at the catheter insertion area. Follow these instructions at home: Medicines  Take over-the-counter and prescription medicines only as told by your health care provider. Insertion site care 1. Follow instructions from your health care provider about how to take care of your insertion site. Make sure you: ? Wash your hands with soap and water before you change your bandage (dressing). If soap and water are not available, use hand sanitizer. ? Remove your dressing as told by your health care provider. In 24 hours 2. Check your insertion site every day for signs of infection. Check for: ? Redness, swelling, or pain. ? Fluid or blood. ? Pus or a bad smell. ? Warmth. 3. Do not take baths, swim, or use a hot tub until your health care provider approves. 4. You may shower 24-48 hours after the procedure, or as directed by your health care provider. ? Remove the dressing and gently wash the site with plain soap and water. ? Pat the area dry with a clean towel. ? Do not rub the site. That could cause bleeding. 5. Do not apply powder or lotion to the site. Activity   1. For 24 hours after the procedure, or as directed by your health care provider: ? Do not flex or bend  the affected arm. ? Do not push or pull heavy objects with the affected arm. ? Do not drive yourself home from the hospital or clinic. You may drive 24 hours after the procedure unless your health care provider tells you not to. ? Do not operate machinery or power tools. 2. Do not lift anything that is heavier than 10 lb (4.5 kg), or the limit that you are told, until your health care provider says that it is safe.  For 4 days 3. Ask your health care provider when it is okay to: ? Return to work or school. ? Resume usual physical activities or sports. ? Resume sexual activity. General instructions  If the catheter site starts to bleed, raise your arm and put firm pressure on the site. If the bleeding does not stop, get help right away. This is a medical emergency.  If you went home on the same day as your procedure, a responsible adult should be with you for the first 24 hours after you arrive home.  Keep all follow-up visits as told by your health care provider. This is important. Contact a health care provider if:  You have a fever.  You have redness, swelling, or yellow drainage around your insertion site. Get help right away if:  You have unusual pain at the radial site.  The catheter insertion area swells very fast.  The insertion area is bleeding, and the bleeding does not stop when  you hold steady pressure on the area.  Your arm or hand becomes pale, cool, tingly, or numb. These symptoms may represent a serious problem that is an emergency. Do not wait to see if the symptoms will go away. Get medical help right away. Call your local emergency services (911 in the [place] Do not drive yourself to the hospital. Summary  After the procedure, it is common to have bruising and tenderness at the site.  Follow instructions from your health care provider about how to take care of your radial site wound. Check the wound every day for signs of infection.  Do not lift anything that is  heavier than 10 lb (4.5 kg), or the limit that you are told, until your health care provider says that it is safe. This information is not intended to replace advice given to you by your health care provider. Make sure you discuss any questions you have with your health care provider. Document Revised: 11/08/2017 Document Reviewed: 11/08/2017 Elsevier Patient Education  2020 [employer].

## 2020-06-25 ENCOUNTER — Telehealth: Payer: Self-pay

## 2020-06-25 NOTE — Telephone Encounter (Signed)
Left message for the pt to call:    Pt contacted pre-catheterization/ stent scheduled at [facility] for: Friday 06/26/20 Verified arrival time and place: [facility] Main Entrance A [facility]) at: 7 am    No solid food after midnight prior to cath, clear liquids until 5 AM day of procedure. CONTRAST ALLERGY:  AM meds can be  taken pre-cath with sips of water including: ASA 81 mg Plavix 75 mg    Confirmed patient has responsible adult to drive home post procedure and be with patient first 24 hours after arriving home:  You are allowed ONE visitor in the waiting room during the time you are at the hospital for your procedure. Both you and your visitor must wear a mask once you enter the hospital.       COVID-19 Pre-Screening Questions:  . In the past 10 days have you had a new cough, shortness of breath, headache, congestion, fever (100 or greater) unexplained body aches, new sore throat, or sudden loss of taste or sense of smell? [name] In the past 10 days have you been around anyone with known Covid 19?  [name] Have you been vaccinated for COVID-19?

## 2020-06-26 ENCOUNTER — Ambulatory Visit (HOSPITAL_COMMUNITY)
Admission: RE | Admit: 2020-06-26 | Discharge: 2020-06-26 | Disposition: A | Discharge status: Home / Self Care | Payer: Medicare Other | Attending: Cardiology | Admitting: Cardiology

## 2020-06-26 ENCOUNTER — Telehealth (HOSPITAL_COMMUNITY): Payer: Self-pay

## 2020-06-26 ENCOUNTER — Encounter (HOSPITAL_COMMUNITY)
Admission: RE | Disposition: A | Discharge status: Home / Self Care | Payer: Self-pay | Source: Home / Self Care | Attending: Cardiology

## 2020-06-26 DIAGNOSIS — E785 Hyperlipidemia, unspecified: Secondary | ICD-10-CM | POA: Insufficient documentation

## 2020-06-26 DIAGNOSIS — Z951 Presence of aortocoronary bypass graft: Secondary | ICD-10-CM | POA: Insufficient documentation

## 2020-06-26 DIAGNOSIS — Z79890 Hormone replacement therapy (postmenopausal): Secondary | ICD-10-CM | POA: Diagnosis not present

## 2020-06-26 DIAGNOSIS — Z79899 Other long term (current) drug therapy: Secondary | ICD-10-CM | POA: Diagnosis not present

## 2020-06-26 DIAGNOSIS — Z952 Presence of prosthetic heart valve: Secondary | ICD-10-CM | POA: Diagnosis not present

## 2020-06-26 DIAGNOSIS — I351 Nonrheumatic aortic (valve) insufficiency: Secondary | ICD-10-CM | POA: Insufficient documentation

## 2020-06-26 DIAGNOSIS — I5042 Chronic combined systolic (congestive) and diastolic (congestive) heart failure: Secondary | ICD-10-CM | POA: Diagnosis not present

## 2020-06-26 DIAGNOSIS — E039 Hypothyroidism, unspecified: Secondary | ICD-10-CM | POA: Diagnosis not present

## 2020-06-26 DIAGNOSIS — Z953 Presence of xenogenic heart valve: Secondary | ICD-10-CM

## 2020-06-26 DIAGNOSIS — J449 Chronic obstructive pulmonary disease, unspecified: Secondary | ICD-10-CM | POA: Diagnosis not present

## 2020-06-26 DIAGNOSIS — I130 Hypertensive heart and chronic kidney disease with heart failure and stage 1 through stage 4 chronic kidney disease, or unspecified chronic kidney disease: Secondary | ICD-10-CM | POA: Diagnosis not present

## 2020-06-26 DIAGNOSIS — Z87891 Personal history of nicotine dependence: Secondary | ICD-10-CM | POA: Insufficient documentation

## 2020-06-26 DIAGNOSIS — I739 Peripheral vascular disease, unspecified: Secondary | ICD-10-CM | POA: Diagnosis not present

## 2020-06-26 DIAGNOSIS — I25119 Atherosclerotic heart disease of native coronary artery with unspecified angina pectoris: Secondary | ICD-10-CM | POA: Diagnosis not present

## 2020-06-26 DIAGNOSIS — N1831 Chronic kidney disease, stage 3a: Secondary | ICD-10-CM | POA: Diagnosis not present

## 2020-06-26 DIAGNOSIS — Z7902 Long term (current) use of antithrombotics/antiplatelets: Secondary | ICD-10-CM | POA: Diagnosis not present

## 2020-06-26 DIAGNOSIS — I472 Ventricular tachycardia: Secondary | ICD-10-CM | POA: Insufficient documentation

## 2020-06-26 DIAGNOSIS — I25110 Atherosclerotic heart disease of native coronary artery with unstable angina pectoris: Secondary | ICD-10-CM | POA: Diagnosis not present

## 2020-06-26 DIAGNOSIS — Z720 Tobacco use: Secondary | ICD-10-CM | POA: Diagnosis present

## 2020-06-26 DIAGNOSIS — Z955 Presence of coronary angioplasty implant and graft: Secondary | ICD-10-CM

## 2020-06-26 HISTORY — PX: CORONARY STENT INTERVENTION: CATH118234

## 2020-06-26 HISTORY — PX: CORONARY ANGIOGRAPHY: CATH118303

## 2020-06-26 HISTORY — PX: INTRAVASCULAR ULTRASOUND/IVUS: CATH118244

## 2020-06-26 LAB — BASIC METABOLIC PANEL
Anion gap: 9 (ref 5–15)
BUN: 18 mg/dL (ref 8–23)
CO2: 22 mmol/L (ref 22–32)
Calcium: 9.1 mg/dL (ref 8.9–10.3)
Chloride: 109 mmol/L (ref 98–111)
Creatinine, Ser: 0.98 mg/dL (ref 0.61–1.24)
GFR calc Af Amer: >60 mL/min (ref >60)
GFR calc non Af Amer: >60 mL/min (ref >60)
Glucose, Bld: 97 mg/dL (ref 70–99)
Potassium: 3.9 mmol/L (ref 3.5–5.1)
Sodium: 140 mmol/L (ref 135–145)

## 2020-06-26 LAB — CBC
HCT: 43.1 % (ref 39.0–52.0)
Hemoglobin: 13.8 g/dL (ref 13.0–17.0)
MCH: 29.6 pg (ref 26.0–34.0)
MCHC: 32 g/dL (ref 30.0–36.0)
MCV: 92.3 fL (ref 80.0–100.0)
Platelets: 213 10*3/uL (ref 150–400)
RBC: 4.67 MIL/uL (ref 4.22–5.81)
RDW: 15.9 % — ABNORMAL HIGH (ref 11.5–15.5)
WBC: 8.5 10*3/uL (ref 4.0–10.5)
nRBC: 0 % (ref 0.0–0.2)

## 2020-06-26 LAB — POCT ACTIVATED CLOTTING TIME
Activated Clotting Time: 230 seconds
Activated Clotting Time: 384 seconds

## 2020-06-26 SURGERY — CORONARY STENT INTERVENTION
Anesthesia: LOCAL

## 2020-06-26 MED ORDER — MIDAZOLAM HCL 2 MG/2ML IJ SOLN
INTRAMUSCULAR | Status: AC
  Filled 2020-06-26: qty 2

## 2020-06-26 MED ORDER — NITROGLYCERIN 1 MG/10 ML FOR IR/CATH LAB
INTRA_ARTERIAL | Status: DC | PRN
  Administered 2020-06-26: 200 ug via INTRACORONARY
  Administered 2020-06-26: 200 ug
  Administered 2020-06-26: 200 ug via INTRACORONARY

## 2020-06-26 MED ORDER — FENTANYL CITRATE (PF) 100 MCG/2ML IJ SOLN
INTRAMUSCULAR | Status: AC
  Filled 2020-06-26: qty 2

## 2020-06-26 MED ORDER — LIDOCAINE HCL (PF) 1 % IJ SOLN
INTRAMUSCULAR | Status: DC | PRN
  Administered 2020-06-26: 2 mL

## 2020-06-26 MED ORDER — SODIUM CHLORIDE 0.9% FLUSH: 3.0000 mL | Freq: Two times a day (BID) | INTRAVENOUS | Status: DC

## 2020-06-26 MED ORDER — ASPIRIN EC 81 MG PO TBEC: 81.0000 mg | DELAYED_RELEASE_TABLET | Freq: Every day | ORAL | Status: DC

## 2020-06-26 MED ORDER — HEPARIN SODIUM (PORCINE) 1000 UNIT/ML IJ SOLN
INTRAMUSCULAR | Status: AC
  Filled 2020-06-26: qty 1

## 2020-06-26 MED ORDER — SODIUM CHLORIDE 0.9 % IV SOLN: INTRAVENOUS | Status: DC

## 2020-06-26 MED ORDER — METOPROLOL SUCCINATE 12.5 MG HALF TABLET: 12.5000 mg | ORAL_TABLET | Freq: Every day | ORAL | Status: DC

## 2020-06-26 MED ORDER — SODIUM CHLORIDE 0.9 % WEIGHT BASED INFUSION: 1.0000 mL/kg/h | INTRAVENOUS | Status: AC

## 2020-06-26 MED ORDER — LIDOCAINE HCL (PF) 1 % IJ SOLN
INTRAMUSCULAR | Status: AC
  Filled 2020-06-26: qty 30

## 2020-06-26 MED ORDER — ACETAMINOPHEN 325 MG PO TABS: 650.0000 mg | ORAL_TABLET | ORAL | Status: DC | PRN

## 2020-06-26 MED ORDER — SODIUM CHLORIDE 0.9 % IV SOLN: 250.0000 mL | INTRAVENOUS | Status: DC | PRN

## 2020-06-26 MED ORDER — HYDRALAZINE HCL 20 MG/ML IJ SOLN
INTRAMUSCULAR | Status: AC
  Filled 2020-06-26: qty 1

## 2020-06-26 MED ORDER — HEPARIN (PORCINE) IN NACL 1000-0.9 UT/500ML-% IV SOLN
INTRAVENOUS | Status: DC | PRN
  Administered 2020-06-26 (×2): 500 mL

## 2020-06-26 MED ORDER — CLOPIDOGREL BISULFATE 75 MG PO TABS: 75.0000 mg | ORAL_TABLET | Freq: Every day | ORAL | Status: DC

## 2020-06-26 MED ORDER — LEVOTHYROXINE SODIUM 112 MCG PO TABS: 112.0000 ug | ORAL_TABLET | Freq: Every day | ORAL | Status: DC

## 2020-06-26 MED ORDER — PREDNISONE 5 MG PO TABS: 5.0000 mg | ORAL_TABLET | Freq: Every day | ORAL | Status: DC

## 2020-06-26 MED ORDER — VERAPAMIL HCL 2.5 MG/ML IV SOLN
INTRAVENOUS | Status: DC | PRN
  Administered 2020-06-26: 10 mL via INTRA_ARTERIAL

## 2020-06-26 MED ORDER — ISOSORBIDE MONONITRATE ER 30 MG PO TB24: 15.0000 mg | ORAL_TABLET | Freq: Every day | ORAL | Status: DC

## 2020-06-26 MED ORDER — ASPIRIN 81 MG PO CHEW: 81.0000 mg | CHEWABLE_TABLET | ORAL | Status: DC

## 2020-06-26 MED ORDER — IOHEXOL 350 MG/ML SOLN
INTRAVENOUS | Status: DC | PRN
  Administered 2020-06-26: 175 mL

## 2020-06-26 MED ORDER — MIDAZOLAM HCL 2 MG/2ML IJ SOLN
INTRAMUSCULAR | Status: DC | PRN
  Administered 2020-06-26: 2 mg via INTRAVENOUS
  Administered 2020-06-26: 1 mg via INTRAVENOUS

## 2020-06-26 MED ORDER — NITROGLYCERIN 0.4 MG SL SUBL: 0.4000 mg | SUBLINGUAL_TABLET | SUBLINGUAL | Status: DC | PRN

## 2020-06-26 MED ORDER — HEPARIN SODIUM (PORCINE) 1000 UNIT/ML IJ SOLN
INTRAMUSCULAR | Status: DC | PRN
  Administered 2020-06-26: 4000 [IU] via INTRAVENOUS
  Administered 2020-06-26: 7000 [IU] via INTRAVENOUS

## 2020-06-26 MED ORDER — ATORVASTATIN CALCIUM 80 MG PO TABS: 80.0000 mg | ORAL_TABLET | Freq: Every day | ORAL | Status: DC

## 2020-06-26 MED ORDER — SODIUM CHLORIDE 0.9% FLUSH: 3.0000 mL | INTRAVENOUS | Status: DC | PRN

## 2020-06-26 MED ORDER — FENTANYL CITRATE (PF) 100 MCG/2ML IJ SOLN
INTRAMUSCULAR | Status: DC | PRN
Start: 2020-06-26 — End: 2020-06-26
  Administered 2020-06-26 (×2): 25 ug via INTRAVENOUS

## 2020-06-26 MED ORDER — HEPARIN (PORCINE) IN NACL 1000-0.9 UT/500ML-% IV SOLN
INTRAVENOUS | Status: AC
  Filled 2020-06-26: qty 1000

## 2020-06-26 MED ORDER — HYDRALAZINE HCL 20 MG/ML IJ SOLN
INTRAMUSCULAR | Status: DC | PRN
  Administered 2020-06-26: 10 mg via INTRAVENOUS

## 2020-06-26 MED ORDER — ONDANSETRON HCL 4 MG/2ML IJ SOLN: 4.0000 mg | Freq: Four times a day (QID) | INTRAMUSCULAR | Status: DC | PRN

## 2020-06-26 MED ORDER — VERAPAMIL HCL 2.5 MG/ML IV SOLN
INTRAVENOUS | Status: AC
  Filled 2020-06-26: qty 2

## 2020-06-26 MED ORDER — NITROGLYCERIN 1 MG/10 ML FOR IR/CATH LAB
INTRA_ARTERIAL | Status: AC
  Filled 2020-06-26: qty 10

## 2020-06-26 SURGICAL SUPPLY — 26 items
BALLN SAPPHIRE 2.0X12 (BALLOONS) ×2
BALLN SAPPHIRE ~~LOC~~ 2.5X12 (BALLOONS) ×2 IMPLANT
BALLN SAPPHIRE ~~LOC~~ 2.75X15 (BALLOONS) ×2 IMPLANT
BALLN SAPPHIRE ~~LOC~~ 3.25X15 (BALLOONS) ×2 IMPLANT
BALLN ~~LOC~~ EMERGE MR 4.0X8 (BALLOONS) ×2
BALLOON SAPPHIRE 2.0X12 (BALLOONS) ×1 IMPLANT
BALLOON ~~LOC~~ EMERGE MR 4.0X8 (BALLOONS) ×1 IMPLANT
CATH LAUNCHER 6FR EBU3.5 (CATHETERS) ×2 IMPLANT
CATH OPTICROSS HD (CATHETERS) ×2 IMPLANT
DEVICE RAD COMP TR BAND LRG (VASCULAR PRODUCTS) ×2 IMPLANT
ELECT DEFIB PAD ADLT CADENCE (PAD) ×2 IMPLANT
GLIDESHEATH SLEND SS 6F .021 (SHEATH) ×2 IMPLANT
GUIDEWIRE INQWIRE 1.5J.035X260 (WIRE) ×1 IMPLANT
INQWIRE 1.5J .035X260CM (WIRE) ×2
KIT ENCORE 26 ADVANTAGE (KITS) ×2 IMPLANT
KIT HEART LEFT (KITS) ×2 IMPLANT
PACK CARDIAC CATHETERIZATION (CUSTOM PROCEDURE TRAY) ×2 IMPLANT
SLED PULL BACK IVUS (MISCELLANEOUS) ×2 IMPLANT
STENT RESOLUTE ONYX 2.25X18 (Permanent Stent) ×2 IMPLANT
STENT RESOLUTE ONYX 2.5X15 (Permanent Stent) ×2 IMPLANT
STENT RESOLUTE ONYX 2.5X26 (Permanent Stent) ×2 IMPLANT
STENT RESOLUTE ONYX 3.0X30 (Permanent Stent) ×2 IMPLANT
TRANSDUCER W/STOPCOCK (MISCELLANEOUS) ×2 IMPLANT
TUBING CIL FLEX 10 FLL-RA (TUBING) ×2 IMPLANT
WIRE ASAHI PROWATER 180CM (WIRE) ×2 IMPLANT
WIRE HI TORQ WHISPER MS 190CM (WIRE) ×2 IMPLANT

## 2020-06-26 NOTE — Discharge Instructions (Signed)
Radial Site Care  This sheet gives you information about how to care for yourself after your procedure. Your health care provider may also give you more specific instructions. If you have problems or questions, contact your health care provider. What can I expect after the procedure? After the procedure, it is common to have:  Bruising and tenderness at the catheter insertion area. Follow these instructions at home: Medicines  Take over-the-counter and prescription medicines only as told by your health care provider. Insertion site care  Follow instructions from your health care provider about how to take care of your insertion site. Make sure you: ? Wash your hands with soap and water before you change your bandage (dressing). If soap and water are not available, use hand sanitizer. ? Change your dressing as told by your health care provider. ? Leave stitches (sutures), skin glue, or adhesive strips in place. These skin closures may need to stay in place for 2 weeks or longer. If adhesive strip edges start to loosen and curl up, you may trim the loose edges. Do not remove adhesive strips completely unless your health care provider tells you to do that.  Check your insertion site every day for signs of infection. Check for: ? Redness, swelling, or pain. ? Fluid or blood. ? Pus or a bad smell. ? Warmth.  Do not take baths, swim, or use a hot tub until your health care provider approves.  You may shower 24-48 hours after the procedure, or as directed by your health care provider. ? Remove the dressing and gently wash the site with plain soap and water. ? Pat the area dry with a clean towel. ? Do not rub the site. That could cause bleeding.  Do not apply powder or lotion to the site. Activity   For 24 hours after the procedure, or as directed by your health care provider: ? Do not flex or bend the affected arm. ? Do not push or pull heavy objects with the affected arm. ? Do not  drive yourself home from the hospital or clinic. You may drive 24 hours after the procedure unless your health care provider tells you not to. ? Do not operate machinery or power tools.  Do not lift anything that is heavier than 10 lb (4.5 kg), or the limit that you are told, until your health care provider says that it is safe.  Ask your health care provider when it is okay to: ? Return to work or school. ? Resume usual physical activities or sports. ? Resume sexual activity. General instructions  If the catheter site starts to bleed, raise your arm and put firm pressure on the site. If the bleeding does not stop, get help right away. This is a medical emergency.  If you went home on the same day as your procedure, a responsible adult should be with you for the first 24 hours after you arrive home.  Keep all follow-up visits as told by your health care provider. This is important. Contact a health care provider if:  You have a fever.  You have redness, swelling, or yellow drainage around your insertion site. Get help right away if:  You have unusual pain at the radial site.  The catheter insertion area swells very fast.  The insertion area is bleeding, and the bleeding does not stop when you hold steady pressure on the area.  Your arm or hand becomes pale, cool, tingly, or numb. These symptoms may represent a serious problem   that is an emergency. Do not wait to see if the symptoms will go away. Get medical help right away. Call your local emergency services (911 in the [place] Do not drive yourself to the hospital. Summary  After the procedure, it is common to have bruising and tenderness at the site.  Follow instructions from your health care provider about how to take care of your radial site wound. Check the wound every day for signs of infection.  Do not lift anything that is heavier than 10 lb (4.5 kg), or the limit that you are told, until your health care provider says  that it is safe. This information is not intended to replace advice given to you by your health care provider. Make sure you discuss any questions you have with your health care provider. Document Revised: 11/08/2017 Document Reviewed: 11/08/2017 Elsevier Patient Education  2020 [employer]  

## 2020-06-26 NOTE — Progress Notes (Signed)
CARDIAC REHAB PHASE I   Stent education completed with pt. Pt educated on importance of ASA and Plavix. Pt given stent cards along with heart healthy diet. Reviewed site care, restrictions, and exercise guidelines. Will refer to CRP II [place], pt not interested in attending.  [id number] Reynold Bowen, RN BSN 06/26/2020 2:07 PM

## 2020-06-26 NOTE — Interval H&P Note (Signed)
History and Physical Interval Note:  06/26/2020 9:52 AM  Dennis Gomez  has presented today for surgery, with the diagnosis of cad.  The various methods of treatment have been discussed with the patient and family. After consideration of risks, benefits and other options for treatment, the patient has consented to  Procedure(s): CORONARY STENT INTERVENTION (N/A) as a surgical intervention.  The patient's history has been reviewed, patient examined, no change in status, stable for surgery.  I have reviewed the patient's chart and labs.  Questions were answered to the patient's satisfaction.   Cath Lab Visit (complete for each Cath Lab visit)  Clinical Evaluation Leading to the Procedure:   ACS: No.  Non-ACS:    Anginal Classification: CCS III  Anti-ischemic medical therapy: Maximal Therapy (2 or more classes of medications)  Non-Invasive Test Results: No non-invasive testing performed  Prior CABG: Previous CABG        [name] 06/26/2020 9:53 AM

## 2020-06-26 NOTE — Discharge Summary (Signed)
Discharge Summary for Same Day PCI   Patient ID: Donterrius Santucci MRN: [id number]; DOB: 12-15-1950  Admit date: 06/26/2020 Discharge date: 06/26/2020  Primary Care Provider: [name], MD  Primary Cardiologist: [name], DO  Primary Electrophysiologist:  None   Discharge Diagnoses    Principal Problem:   Coronary artery disease involving native coronary artery of native heart with angina pectoris [facility]) Active Problems:   Aortic regurgitation   COPD (chronic obstructive pulmonary disease) (HCC)   Tobacco abuse   S/P aortic valve replacement with bioprosthetic valve    S/P CABG x 4  Diagnostic Studies/Procedures    Cardiac Catheterization 06/23/2020:   1st Diag lesion is 80% stenosed.  Prox LAD to Mid LAD lesion is 80% stenosed.  1st Mrg-1 lesion is 60% stenosed.  1st Mrg-2 lesion is 95% stenosed.  Prox Cx to Mid Cx lesion is 40% stenosed.  Ramus lesion is 80% stenosed.  LPAV lesion is 80% stenosed.  SVG graft was visualized by angiography.  Origin lesion is 100% stenosed.  SVG.  Origin to Prox Graft lesion is 100% stenosed.  SVG.  Origin lesion is 100% stenosed.  Dist Graft to Insertion lesion is 75% stenosed.  LPDA lesion is 100% stenosed.  There is no aortic valve regurgitation.   1. Left dominant circulation 2. Severe native 3 vessel disease.    - 80% proximal LAD    - 80% first diagonal    - 60% proximal large OM1 followed by 95% stenosis prior to graft insertion    - segmental 80% distal LCx. The left PDA appears occluded distally 3. Patent LIMA to the LAD with 75% narrowing prior to the anastomosis 4. Occluded Y graft SVG to first diagonal and OM1 5. Occluded SVG to left PDA 6. Mildly dilated aortic root. No AI.  Plan: will review films with interventional colleagues. Plan to return for complex PCI.   Diagnostic Dominance: Left   Cardiac Catheterization 06/26/2020:  1. Successful PCI of the proximal to mid LAD with DES x 1 and  IVUS guidance 2. Successful PCI of the distal LCX with DES x 1 3. Successful PCI of the proximal and distal OM1 with DES x 2  Plan: DAPT for at least a year and probably indefinitely. Will anticipate same day DC today.   Diagnostic Dominance: Left  Intervention   _____________   History of Present Illness     Dennis Gomez is a 69 y.o. male with a hx of COPD, tobacco abuse(quit), hypothroidism,HTN, acute CHF/LV dysfunction (EF now 60-65% 08/2019- improvement from 35-40%), moderate-severe aortic regurgitation s/p AVRwith inspiris 31mm bioprosthesis, CAD s/p CABGX4who presented for follow up.  He was seen in January 31, 2020 at that time he appeared to be stable from a cardiovascular standpoint he had resolved his nosebleed. He was continued on aspirin statin and recommended follow-up in 6 months.  In the interim Per records on May 14 he was doing work when he suddenly experienced loss of vision in his right eye. 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 The etiology was unknown but there was concern about cryptogenic stroke as well. He was discharged on aspirin and Plavix for 30 days at which time he was asked to stop the aspirin continue Plavix. He also kept on his Lipitor to milligrams daily.  Seen again April 27, 2020 at that time giving his interim report of being hospitalized at  [facility] and questionable PFO.  Also at the time discussed with the patient he had a TEE doing his valve surgery and it did not show any PFO.  He was able to get his transthoracic echocardiogram done with continued saline study and this was negative for PFO.  In addition due to the concern for cryptogenic stroke a monitor was placed on the patient.  He presented back to the office on 8/26 and had been experiencing intermittent chest pain.  He described as a  midsternal chest pressure-like sensation.  Also complained of intermittent shortness of breath. Given findings he was set up for outpatient cardiac cath.   Hospital Course     The patient underwent cardiac cath on 06/23/20 with severe 3v disease, patent LIMA-LAD with 75% narrowing prior to the anastomosis, along with occluded Y graft SVG to the 1st diag and OM1, and occluded SVG to left PDA. He was brought back for complex PCI after films were reviewed with interventional colleagues. Underwent cath as noted above with successful PCI/DES x1 to the mLAD, DES x1 to the dLCx and DES x2 to the p/dOM1. Plan for DAPT with ASA/plavix for at least 12 months. The patient was seen by cardiac rehab while in short stay. There were no observed complications post cath. Radial cath site was re-evaluated prior to discharge and found to be stable without any complications. Instructions/precautions regarding cath site care were given prior to discharge.  Omkar Stratmann was seen by Dr. [place] and determined stable for discharge home. Follow up with our office has been arranged. Medications are listed below. Pertinent changes include N/a.  _____________  Cath/PCI Registry Performance & Quality Measures: 1. Aspirin prescribed? - Yes 2. ADP Receptor Inhibitor (Plavix/Clopidogrel, Brilinta/Ticagrelor or Effient/Prasugrel) prescribed (includes medically managed patients)? - Yes 3. High Intensity Statin (Lipitor 40-80mg  or Crestor 20-40mg ) prescribed? - Yes 4. For EF <40%, was ACEI/ARB prescribed? - Not Applicable (EF >/= 40%) 5. For EF <40%, Aldosterone Antagonist (Spironolactone or Eplerenone) prescribed? - Not Applicable (EF >/= 40%) 6. Cardiac Rehab Phase II ordered (Included Medically managed Patients)? - Yes  _____________   Discharge Vitals Blood pressure 109/77, pulse 72, temperature 98 F (36.7 C), temperature source Oral, resp. rate 16, height 5\' 4"  (1.626 m), weight 68 kg, SpO2 96 %.  Filed Weights    06/26/20 0617  Weight: 68 kg    Last Labs & Radiologic Studies    CBC Recent Labs    06/26/20 0717  WBC 8.5  HGB 13.8  HCT 43.1  MCV 92.3  PLT 213   Basic Metabolic Panel Recent Labs    08/26/20 0717  NA 140  K 3.9  CL 109  CO2 22  GLUCOSE 97  BUN 18  CREATININE 0.98  CALCIUM 9.1   Liver Function Tests No results for input(s): AST, ALT, ALKPHOS, BILITOT, PROT, ALBUMIN in the last 72 hours. No results for input(s): LIPASE, AMYLASE in the last 72 hours. High Sensitivity Troponin:   No results for input(s): TROPONINIHS in the last 720 hours.  BNP Invalid input(s): POCBNP D-Dimer No results for input(s): DDIMER in the last 72 hours. Hemoglobin A1C No results for input(s): HGBA1C in the last 72 hours. Fasting Lipid Panel No results for input(s): CHOL, HDL, LDLCALC, TRIG, CHOLHDL, LDLDIRECT in the last 72 hours. Thyroid Function Tests No results for input(s): TSH, T4TOTAL, T3FREE, THYROIDAB in the last 72 hours.  Invalid input(s): FREET3 _____________  CARDIAC CATHETERIZATION  Result Date: 06/26/2020  1st Diag lesion is  60% stenosed.  Prox LAD to Mid LAD lesion is 80% stenosed.  A drug-eluting stent was successfully placed using a STENT RESOLUTE ONYX 3.0X30.  Post intervention, there is a 0% residual stenosis.  1st Mrg-1 lesion is 60% stenosed.  A drug-eluting stent was successfully placed using a STENT RESOLUTE ONYX 2.5X15.  Post intervention, there is a 0% residual stenosis.  1st Mrg-2 lesion is 95% stenosed.  A drug-eluting stent was successfully placed using a STENT RESOLUTE ONYX 2.25X18.  Post intervention, there is a 0% residual stenosis.  Prox Cx to Mid Cx lesion is 40% stenosed.  LPDA lesion is 100% stenosed.  LPAV lesion is 80% stenosed.  A drug-eluting stent was successfully placed using a STENT RESOLUTE ONYX 2.5X26.  Post intervention, there is a 0% residual stenosis.  Ramus lesion is 80% stenosed.  SVG.  Origin lesion is 100% stenosed.  SVG.   Origin to Prox Graft lesion is 100% stenosed.  SVG.  Origin lesion is 100% stenosed.  Dist Graft to Insertion lesion is 75% stenosed.  1. Successful PCI of the proximal to mid LAD with DES x 1 and IVUS guidance 2. Successful PCI of the distal LCX with DES x 1 3. Successful PCI of the proximal and distal OM1 with DES x 2 Plan: DAPT for at least a year and probably indefinitely. Will anticipate same day DC today.   CARDIAC CATHETERIZATION  Result Date: 06/23/2020  1st Diag lesion is 80% stenosed.  Prox LAD to Mid LAD lesion is 80% stenosed.  1st Mrg-1 lesion is 60% stenosed.  1st Mrg-2 lesion is 95% stenosed.  Prox Cx to Mid Cx lesion is 40% stenosed.  Ramus lesion is 80% stenosed.  LPAV lesion is 80% stenosed.  SVG graft was visualized by angiography.  Origin lesion is 100% stenosed.  SVG.  Origin to Prox Graft lesion is 100% stenosed.  SVG.  Origin lesion is 100% stenosed.  Dist Graft to Insertion lesion is 75% stenosed.  LPDA lesion is 100% stenosed.  There is no aortic valve regurgitation.  1. Left dominant circulation 2. Severe native 3 vessel disease.    - 80% proximal LAD    - 80% first diagonal    - 60% proximal large OM1 followed by 95% stenosis prior to graft insertion    - segmental 80% distal LCx. The left PDA appears occluded distally 3. Patent LIMA to the LAD with 75% narrowing prior to the anastomosis 4. Occluded Y graft SVG to first diagonal and OM1 5. Occluded SVG to left PDA 6. Mildly dilated aortic root. No AI. Plan: will review films with interventional colleagues. Plan to return for complex PCI.       Disposition   Pt is being discharged home today in good condition.  Follow-up Plans & Appointments     Follow-up Information    Tobb, Lavona Mound, DO Follow up on 07/02/2020.   Specialty: Cardiology Why: at 1:40pm for your follow up appt. Contact information: [address] [place] [address] [phone]              Discharge Instructions    Amb Referral  to Cardiac Rehabilitation   Complete by: As directed    Will send Cardiac Rehab Phase 2 referral to [place]   Diagnosis: Coronary Stents   After initial evaluation and assessments completed: Virtual Based Care may be provided alone or in conjunction with Phase 2 Cardiac Rehab based on patient barriers.: Yes      Discharge Medications  Allergies as of 06/26/2020      Reactions   Hydrocodone    Sweaty, possibly passed out      Medication List    TAKE these medications   aspirin EC 81 MG tablet Take 1 tablet (81 mg total) by mouth daily. Swallow whole.   atorvastatin 80 MG tablet Commonly known as: LIPITOR Take 1 tablet (80 mg total) by mouth daily at 6 PM.   clopidogrel 75 MG tablet Commonly known as: PLAVIX Take 75 mg by mouth daily.   isosorbide mononitrate 30 MG 24 hr tablet Commonly known as: IMDUR Take 0.5 tablets (15 mg total) by mouth daily.   levothyroxine 112 MCG tablet Commonly known as: SYNTHROID Take 112 mcg by mouth daily before breakfast.   metoprolol succinate 25 MG 24 hr tablet Commonly known as: Toprol XL Take 0.5 tablets (12.5 mg total) by mouth daily.   nitroGLYCERIN 0.4 MG SL tablet Commonly known as: NITROSTAT Place 1 tablet (0.4 mg total) under the tongue every 5 (five) minutes as needed for chest pain.   predniSONE 10 MG tablet Commonly known as: DELTASONE Take 5 mg by mouth daily.   REMEDY MOISTURE BARRIER EX Apply 1 application topically daily as needed (scaly skin).        Allergies Allergies  Allergen Reactions  . Hydrocodone     Sweaty, possibly passed out    Outstanding Labs/Studies   N/a   Duration of Discharge Encounter   Greater than 30 minutes including physician time.  Signed, Laverda Page, NP 06/26/2020, 2:34 PM

## 2020-06-26 NOTE — Telephone Encounter (Signed)
Faxed cardiac rehab referral to [place]. 

## 2020-06-29 ENCOUNTER — Encounter (HOSPITAL_COMMUNITY): Payer: Self-pay | Admitting: Cardiology

## 2020-07-01 ENCOUNTER — Ambulatory Visit: Payer: Medicare Other | Admitting: Cardiology

## 2020-07-01 IMAGING — CT CT ANGIOGRAPHY CHEST
2 of 8 series · 18 of 46 positions shown · IV contrast (OMNIPAQUE 350)
Comparison: Chest CT dated 05/19/2019.

CLINICAL DATA: 67-year-old male with recurrent syncopal episode.
Recent aortic valve replacement on 05/21/2019. Concern for pulmonary
embolism.

EXAM:
CT ANGIOGRAPHY CHEST WITH CONTRAST
TECHNIQUE: Multidetector CT imaging of the chest was performed using the
standard protocol during bolus administration of intravenous
contrast. Multiplanar CT image reconstructions and MIPs were
obtained to evaluate the vascular anatomy.
CONTRAST:  80mL OMNIPAQUE IOHEXOL 350 MG/ML SOLN

[Series 6: thins · axial · 0.63mm/px · z∈[-324,-66]mm · 15 of 284 slices shown]
[im 13/284  lung]
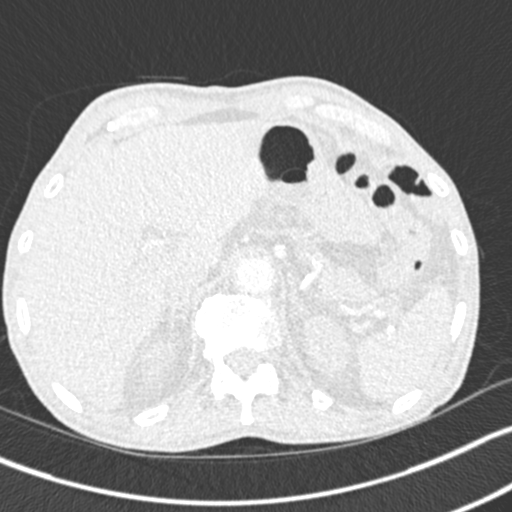
[im 39/284  soft-tissue]
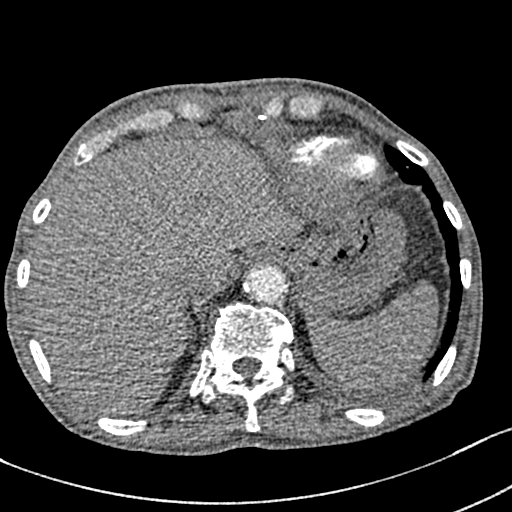
[im 52/284  lung]
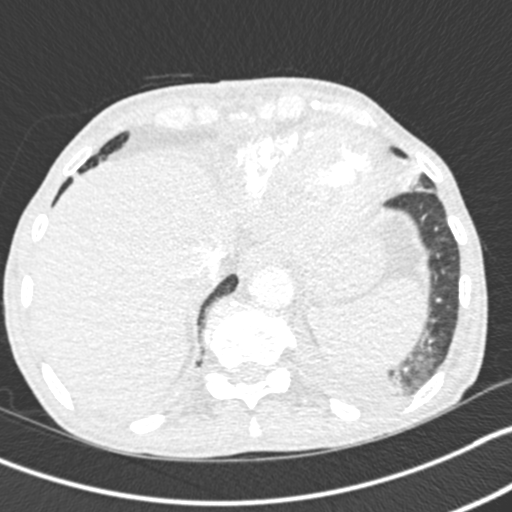
[im 65/284  soft-tissue]
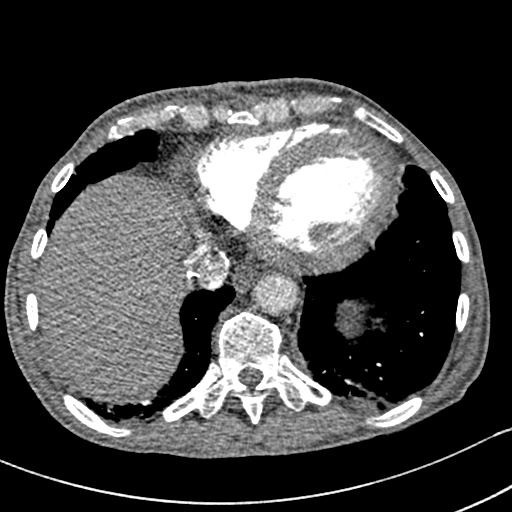
[im 91/284  lung]
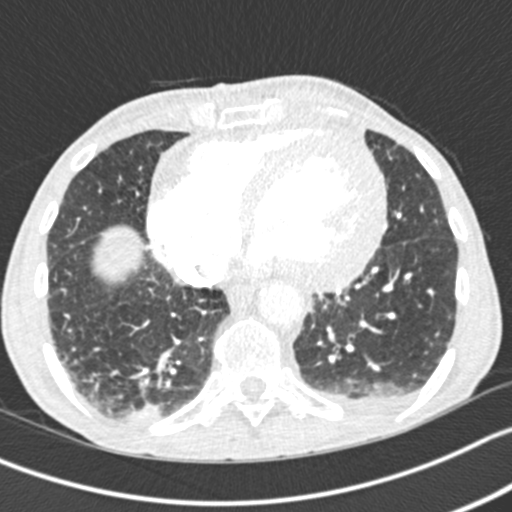
[im 103/284  soft-tissue]
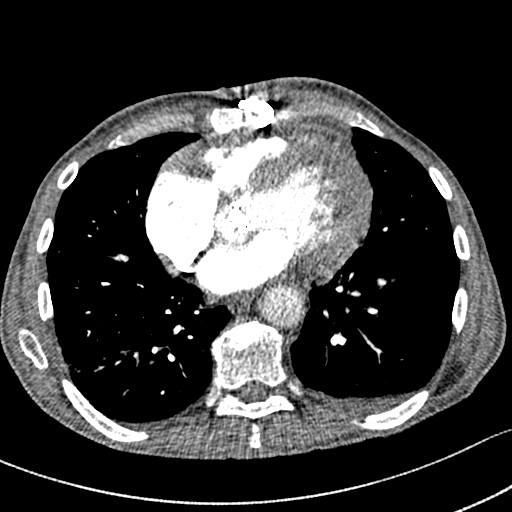
[im 129/284  lung]
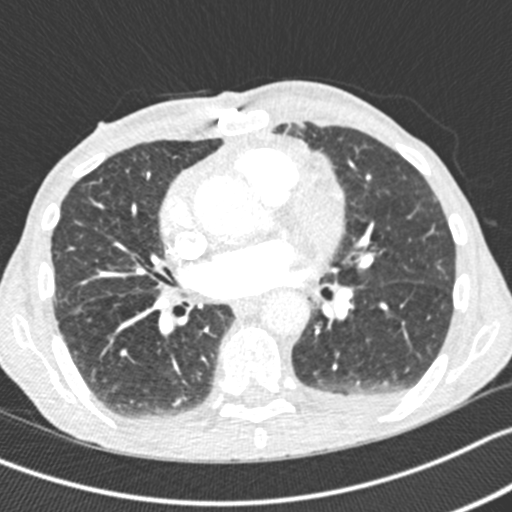
[im 142/284  soft-tissue]
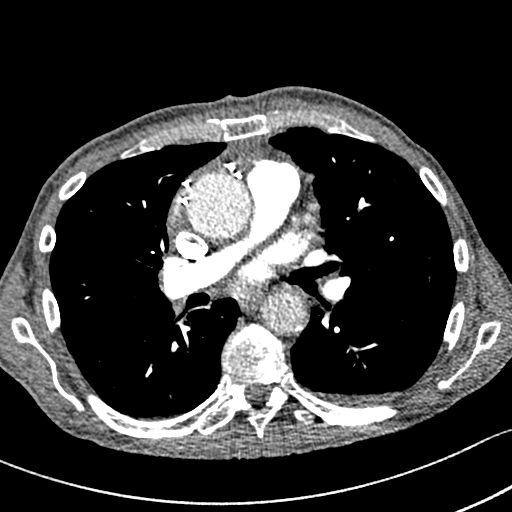
[im 155/284  lung]
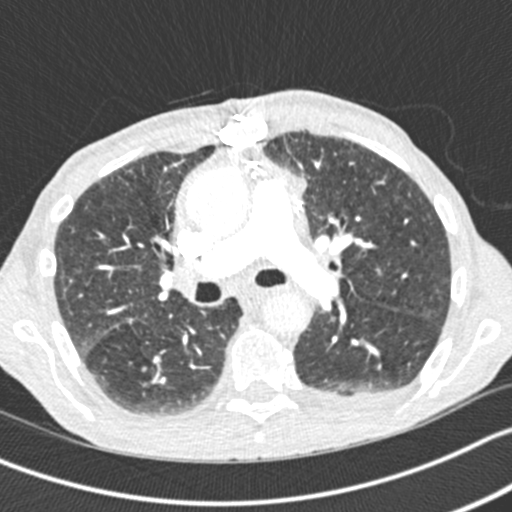
[im 181/284  soft-tissue]
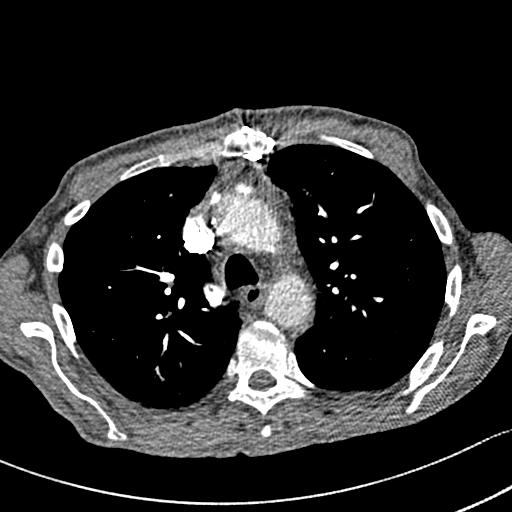
[im 193/284  lung]
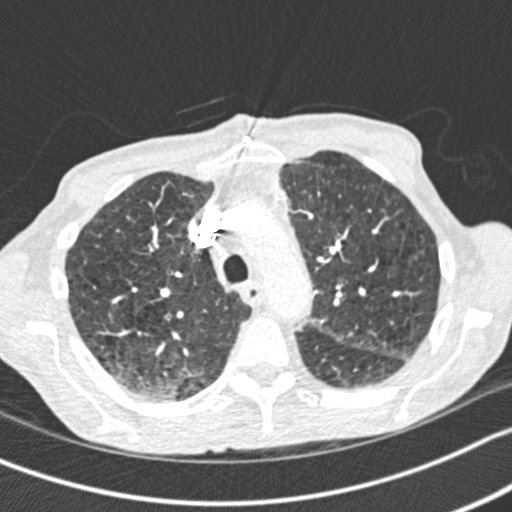
[im 219/284  soft-tissue]
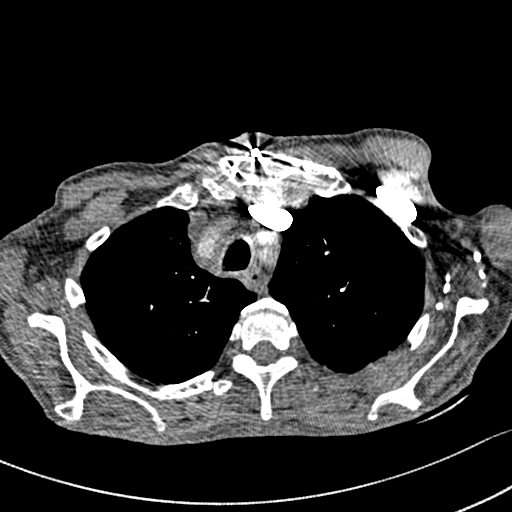
[im 232/284  lung]
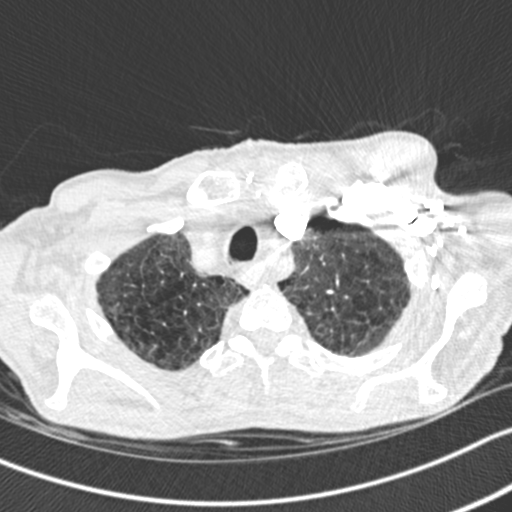
[im 245/284  soft-tissue]
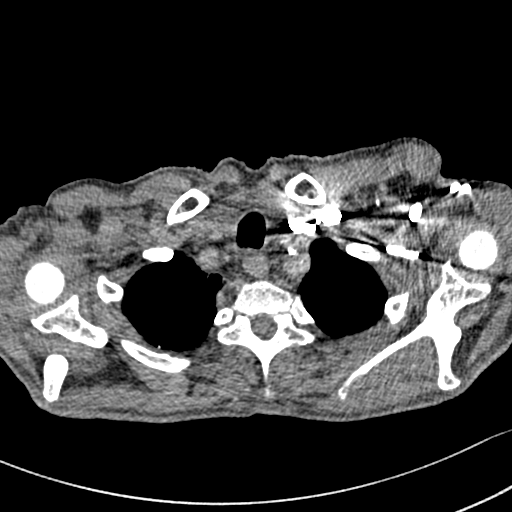
[im 271/284  lung]
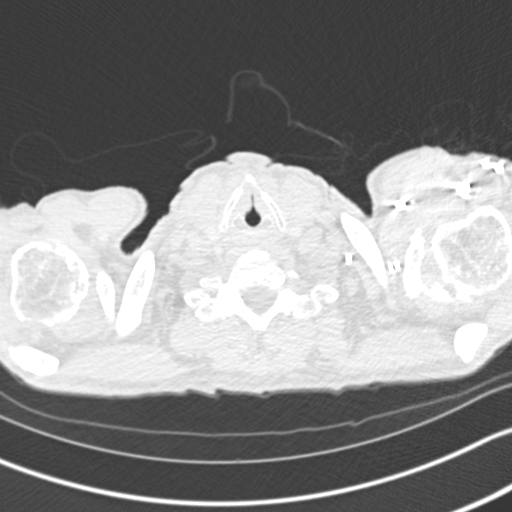

[Series 8: coronal mpr · coronal · 0.55mm/px · 3 of 122 slices shown]
[im 31/122  soft-tissue]
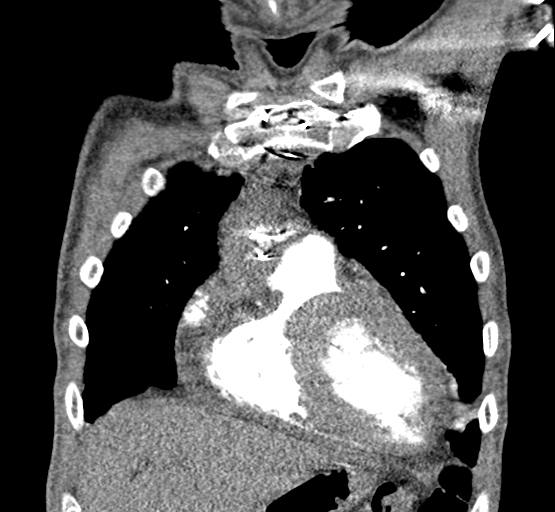
[im 61/122  soft-tissue]
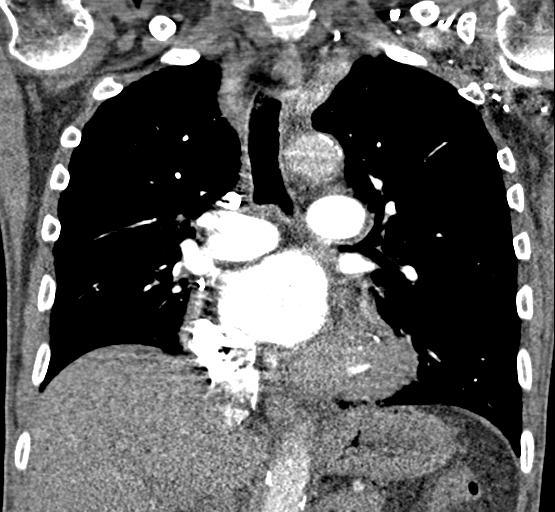
[im 91/122  soft-tissue]
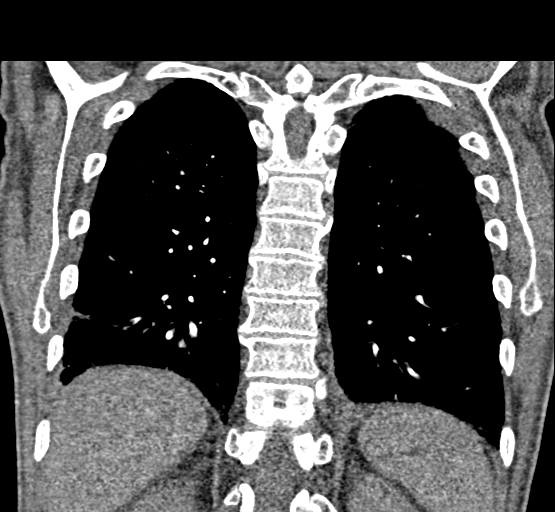

[18 of 46 positions shown; findings below may reference images not displayed]

FINDINGS: Evaluation of this exam is limited due to respiratory motion
artifact.

Cardiovascular: There is mild to moderate cardiomegaly. There is
left ventricular hypertrophy as well as mild dilatation of the right
atrium. There is retrograde flow of contrast from the right atrium
into the IVC in keeping with a degree of right heart dysfunction. No
significant pericardial effusion. Mechanical aortic valve
replacement as well as postsurgical changes of CABG. There is
coronary vascular calcification primarily involving the LAD. There
is moderate atherosclerotic calcification of the thoracic aorta. The
ascending aorta measures up to 4 cm in diameter. There is no CT
evidence of pulmonary embolism.

Mediastinum/Nodes: No hilar or mediastinal adenopathy. The esophagus
is grossly unremarkable. No mediastinal fluid collection.

Lungs/Pleura: There is centrilobular emphysema. Small left pleural
effusion and possibly trace right pleural effusion. No focal
consolidation or pneumothorax. The central airways are patent.

Upper Abdomen: Probable sludge or small stones in the gallbladder.

Musculoskeletal: Median sternotomy wires. There is degenerative
changes of the spine. No acute osseous pathology.

Review of the MIP images confirms the above findings.
IMPRESSION: 1. No CT evidence of pulmonary embolism.
2. Cardiomegaly with evidence of right heart dysfunction.
3. Left ventricular hypertrophy and aortic valve replacement.

Aortic Atherosclerosis (T5952-FRX.X) and Emphysema (T5952-UD2.H).

## 2020-07-02 ENCOUNTER — Other Ambulatory Visit: Payer: Self-pay

## 2020-07-02 ENCOUNTER — Encounter: Payer: Self-pay | Admitting: Cardiology

## 2020-07-02 ENCOUNTER — Ambulatory Visit (INDEPENDENT_AMBULATORY_CARE_PROVIDER_SITE_OTHER): Payer: Medicare Other | Admitting: Cardiology

## 2020-07-02 VITALS — BP 160/100 | HR 70 | Ht 64.0 in | Wt 152.0 lb

## 2020-07-02 DIAGNOSIS — I25119 Atherosclerotic heart disease of native coronary artery with unspecified angina pectoris: Secondary | ICD-10-CM

## 2020-07-02 DIAGNOSIS — Z953 Presence of xenogenic heart valve: Secondary | ICD-10-CM | POA: Diagnosis not present

## 2020-07-02 DIAGNOSIS — Z720 Tobacco use: Secondary | ICD-10-CM

## 2020-07-02 DIAGNOSIS — E785 Hyperlipidemia, unspecified: Secondary | ICD-10-CM | POA: Diagnosis not present

## 2020-07-02 DIAGNOSIS — Z951 Presence of aortocoronary bypass graft: Secondary | ICD-10-CM

## 2020-07-02 DIAGNOSIS — I10 Essential (primary) hypertension: Secondary | ICD-10-CM

## 2020-07-02 MED ORDER — CARVEDILOL 6.25 MG PO TABS: 6.2500 mg | ORAL_TABLET | Freq: Two times a day (BID) | ORAL | 3 refills | Status: DC

## 2020-07-02 NOTE — Progress Notes (Signed)
Cardiology Office Note:    Date:  07/02/2020   ID:  Lenoria Chime, DOB 1951/07/25, MRN [id number]  PCP:  [name], MD  Cardiologist:  [name], DO  Electrophysiologist:  None   Referring MD: [name], MD   Follow-up heart catheterization  History of Present Illness:    Rendon Atondo a 69 y.o.malewith a hx of COPD, tobacco abuse(quit), hypothroidism,HTN, acute CHF/LV dysfunction (EF now 60-65% 08/2019- improvement from 35-40%), moderate-severe aortic regurgitation s/p AVRwith inspiris 31mm bioprosthesis, CAD s/p CABGX4 with recent left heart catheterization DES x4 to the mid LAD, 1st marginal, 2nd marginal, and to the L PDApresents for follow up.  I last saw the patient January 31, 2020 at that time he appeared to be stable from a cardiovascular standpoint he had resolved his nosebleed. We will continue his medication including aspirin statin and recommended follow-up in 6 months.  In the interim Per records on May 14 he was doing work when he suddenly experienced loss of vision in his right eye. 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 The etiology was unknown but there was concern about cryptogenic stroke as well. He was discharged on aspirin and Plavix for 30 days at which time he was asked to stop the aspirin continue Plavix. He also kept on his Lipitor to milligrams daily.  I saw the patient April 27, 2020 at that time giving his interim report of being hospitalized at [facility] and questionable PFO I had the patient get a echo with bubble study.  Also at the time discussed with the patient he had a TEE doing his valve surgery and it did not show any PFO.  He was able to get his transthoracic echocardiogram done with continued saline study and this was negative for PFO.  In addition due to the concern for  cryptogenic stroke a monitor was placed on the patient.  I did see the patient on June 11, 2020 at that time he reported some typical angina symptoms.  In addition I had the patient were monitored due to dizziness which showed multiple episodes of nonsustained ventricular tachycardia.  At his last visit I recommended patient undergo a left heart catheterization.  In the interim he was able to to get his left heart catheterization done and underwent PCI as described above.  He has today for follow-up visit he tells me he is doing a lot better.  Dizziness also has improved.  Past Medical History:  Diagnosis Date  . Acute kidney injury (HCC) 05/17/2019  . Aortic regurgitation   . Chest pain 05/16/2019  . Chronic combined systolic and diastolic CHF (congestive heart failure) (HCC)    a. LVEF 35-40% at [place] in 04/2019, 45-50% at [facility] pre-AVR. 25-30% by repeat echo 05/2019.  [name] Chronic kidney disease (CKD), stage III (moderate)   . Chronic systolic congestive heart failure (HCC)   . COPD (chronic obstructive pulmonary disease) (HCC)   . Coronary artery disease    a. s/p CABGx4 in 05/2019.  [name] Essential hypertension   . Hyperlipidemia LDL goal <70   . Hypothyroid   . Hypothyroidism 05/16/2019  . PAD (peripheral artery disease) (HCC)    a. R/L ABIs indicated mild lower extremity arterial disease and carotid duplex showed 1-39% stenosis.  [name] Postoperative atrial fibrillation (HCC)   . S/P aortic valve replacement with bioprosthetic valve 05/21/2019   25 mm Edwards Inspiris  Resilia stented bovine pericardial tissue valve  . 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 Severe aortic regurgitation   . Syncope 06/17/2019  . Tobacco abuse     Past Surgical History:  Procedure Laterality Date  . AORTIC VALVE REPLACEMENT N/A 05/21/2019   Procedure: AORTIC VALVE REPLACEMENT (AVREVa Health Care Center (Hcc) At [phone] MaMenCentral Oregon Surgery Center LL1 S. FMarland Kitchen>7Charl8468 E.EUniversity Of California Davis Medical Cen32A161Para MaMenSsm Health St. Louis University Hospita8742 SW. RMarland Kitchen>77CharlAlmWhitfield MedicEBaylor Scott And White Surgicare Fort Wo816-4A161Para MaMenVail Valley Surgery Center LLC Dba Vail [facility] Vai7129 GrMarland Kitchen>62CharlAlmEast Orange GeneraEGundersen Luth Med [phone] MaMenMission Hospital Laguna Beac7677 AMarland Kitchen>63CharlAlmKings County Hospital ENorth Valley Hospit23161Para MaMenOklahoma Surgic9 EStar View Adolescent - P [phone] MaMenJohnson Memorial HospiMarland Kitchen>36CharlAlmTexas Children'S Hosp114 EOregon Trail Eye Surgery Cen216A161Para MaMenBillings CliniMarland Kitchen>68CharlAlmValley Eye Surgical 8300 ShadEGreat Lakes Surgery Ctr [phone] MaMenEndoscopy Center Of Ocal54 GlenMarland Kitchen>52CharlAlmCarney HospitaloMarland K7EDublin Va Medical [phone] MaMenWarm Springs Rehabilitation Hospital Of KylMarland Kitchen>24CharlAlmEncompass Health Reading Rehabilitation HospiERegional Health Custer [phone] MaMenMayo Clinic Health Sys Mankat45[name]>47CharlAlmSt Johns HospitaloMarland Kitche409Graciela Hu8097303AlmBeth Israel DeacESanford Bisma339 A161Para MaMenHill Country Memorial Hospita686 [name]>75CharlAlmPacific Endoscopy Cen80EMiddle Tennessee Ambulatory Surgery [phone] MaMenSt. John Owass788[name]>69CharlAlmSt Simons By-Th35EKimble [phone] MaMenSpace Coast Surgery Cente335 [name]>69CharlAlmOverland Park Reg Med CtroMarland KEVia Christi [phone] MaMenTexoma Valley Surgery CenteMarland Kitchen>23CharlAlmPhysicians Surgical Ce877EUw Health Rehabilitation [phone] MaMenBaltimore Ambulatory Center For EndoscopMarland Kitchen>45Cha7838 CENorthwest Spine And Laser Surgery Center [phone] MaMenUw Medicine Northwest Hospita551 MMarland Kitchen>60CharlAlmMetropolitan Methodist EColonoscopy And Endoscopy Center [phone] MaMenTexas Health Presbyterian Hospital Plan556 Big [name]>51CharlAlEAlbuquerque - Amg Specialty Hospital [phone] MaMenWellspan Surgery And Rehabilitation Hospita7847 NW. PurpMarland Kitchen>36CharlAlmTexas Endoscopy Centers LLCoELake City Surgery Center [phone] MaMenSt. Mary - Rogers Memorial Hospita575 [name]>52CharlAlmSt Vincents Outpatient Surgery Service8EAlameda Surgery Center513 A161Para MaMenSouthcoast Behavioral Healt89 ButtMarland Kitchen>74CharlAlmKyle Er & Hospi8278 WestEEllwood City Hospi28A161Para MaMenCarolina Mountain Gastroenterology Endoscopy Center LL508 SWMarland Kitche>35CharlAlmChristus Santa Rosa Physicians Ambulatory Surgery Center New Braunfe223 EJacksonville Surgery Center [phone] MaMenBaylor Scott & White Medical Center - Irvin28 EMarland Kitchen>27CharlESt Joseph [phone] MaMenNorth Memorial Ambulatory Surgery Center At [place] LL7381 W. [name]>45CharlAlmVp Surge42 SaEGrand Island Surgery Cen618A161Para MaMenNatraj Surgery Center In289[name]>85CharlAlmAdventist He9140 GEWca [phone] MaMenGainesville Endoscopy Center LL7[name]>11CharlAlmSaint Vincent HospitaloMarland943 RaEWatts Plastic Surgery Association740 4A161Para MaMenOrange Regional Medical Cente869 [name]>35CharlAlmJohns Hopkins Surgery Centers Series Dba White Marsh Surgery Center SeriesoMarland KESelect Specialty Hospital - Des Moi215 A161Para MaMenVa Central Western Massachusetts Healthcare Syste64 [name]>68CharlAlmGreenbrier Val9957EFairfax Surgical [phone] MaMenNorton Women'S And Kosair Children'S Hospita34 Old GMarland Kitchen>78CharlAlmLake Regional Health SystemoMarland Kitche409Graciela Husba50mdMem8662ERiverside County Regional Medical Cen275A161Para MaMenBaptist Hospitals Of Southeast Texa775 SWMarland Kitchen>34CharlAlmLaporte Medical Group Surg9EMayhill [phone] MaMenVa Sierra Nevada Healthcare Syste7491 West [name]>23CharlAlmThe Physicians Centre HoEPeace Harbor [phone] MaMenMemorial Hermann Tomball Hospita7845 ShMarland Kitchen>48CharlAlmCallahan Eye Hospita88EVibra Hospital Of Fort Wa509A161Para MaMenClarity Child Guidance CentMarland Kitchen>66CharlAlmPiedmont 1 EMill Creek Endoscopy Suites [phone] MaMenWest Tennessee Healthcare North HospitaMarland Kitchen>28CharlAlmSt. Jude Children'S Research HospitaloMarland Kitche409Graciela Husba46mdUniversi2EThe Surgery Center At Jensen Beach [phone] MaMenCarepoint Health - Bayonne Medical Cente8663 [name]>15CharlAlmNew Vision Cataract Center LLC Dba New Vision72ERochelle Community [phone] MaMenDanbury Surgical Center LMarland Kitchen>64CharlAlmSummit Surgery646 GEMemorial Hermann Surgery Center Brazoria [phone] MaMenNmmc Women'S HospitMarland Kitchen>73CharlAlmPiedmont W7ECandescent Eye Surgicenter [phone] MaMenKindred Hospital St Louis Sout8394 [name]>56CharlAlmSeabrook Emergency RoomoMarland Kitche409Graciela Husb7258 NESouth Shore Ambulatory Surgery [phone] MaMenBloomfield Asc LL395 Glen [name]>56CharlAlmKindred Hospital396EAtlantic Rehabilitation [phone] MaMenProgressive Surgical Institute In9400 PariMarland Kitchen>37CharlAlmMemorial Hermann Surgery Center Gr7642EMarion General [phone] MaMenMonongahela Valley Hospita9698 AMarland Kitchen>56CharlAlmTift Regional Medical Cente564 East VEBetsy Johnson [phone] MaMenMontpelier Surgery Cente8778 HMarland Kitchen>59CharlAlmPlastic And Reconstructive S70EDignity Health-St. Rose Dominican Sahara Cam934A161Para MaMenHuntington Va Medical Cente9629 VaMarland Kitchen>34CharlAlmPromedica21ENorwood Hlth [phone] MaMenMayers Memorial Hospita22 SMarland Kitchen>68CharlAlmPeninsula Eye S965EPlaza Ambulatory Surgery Center [phone] MaMenGulf Coast Veterans Health Care Syste94[name]>70CharlAlmPacificELogan Memorial [phone] MaMenConnecticut Childrens Medical Cente89 WMarland Kitche>49CharlAlmMedical West, An Affiliate Of Uab Health SystemoM420EPih Health Hospital- Whitt430-A161Para MaMenPresence Central And Suburban Hospitals Network Dba Presence St Joseph Medical Cente74[name]>20CharlAlmFort Myers Eye Surgery Center LLCoMarland EAdvance Endoscopy Center [phone] MaMenDoctor'S Hospital At Renaissanc9958[name]>54CharlAlmIntegrity Transiti5ESanford Chamberlain Medical [phone] MaMenMethodist Hospita74 OMarland Kitchen>37CharlAlmMemor99ESt Vincent [phone] MaMenYuma Rehabilitation Hospita82 RMarland Kitchen>78CharlAlmCrouse Hospital - Commonwealth Divisiono7010 OEUniversity Of Maryland Shore Surgery Center At Queenstown [phone] MaMenMemorial Hermann Texas International Endoscopy Center Dba Texas International Endoscopy Cente385 PluMarland Kitchen>41CharlAlmLaredo Digestive Health Center LLCoMa5 OECasa Colina Hospital For Rehab Medic303A161Para MaMenMary Lanning Memorial Hospita99[name]>22CharlAlmLogan7016EThe Center For [phone] MaMenLifecare Hospitals Of Pittsburgh - Alle-Kisk571 MarlMarland Kitchen>25CharlAlmDetar NorthoMarland KitcEViewmont Surgery [phone] MaMenAscension Seton Southwest Hospita245 FiMarland Kitchen>52CharlAlmGreat Lakes Surgical Center LLCoMarland Kit7232 ESelect Long Term Care Hospital-Colorado Spri832 6A161Para MaMenMcleod Health Clarendo8397[name]>2CharlAlmFlorida Endoscopy And Surgery Center LLCoMarland Kitche409Graciela Hu9141 Ok2079 EasAlmCedar [address] N. EOakbend Medical Center - Williams [phone] MaMenMinor And James Medical PLL2[name]>55CharlAlmWooster Community HospitaloMarlandESt Mary'S Vincent Evansville [phone] MaMenAthens Surgery Center Lt105[name]>13CharlAlmCoast Surgery CenteroMarland Kitche409Graciela HusbandsBurna Sisurta FreerPam Specialty Hospital Of Corpus Christi NorKindred Hospital Centra A;  . CORONARY ANGIOGRAPHY N/A 06/26/2020   Procedure: CORONARY ANGIOGRAPHY;  Surgeon: Jordan, Peter M, MD;  [facility] INVASIVE CV LAB;  Service: Cardiovascular;  Laterality: N/A;  . CORONARY ARTERY BYPASS GRAFT N/A 05/21/2019   Procedure: CORONARY ARTERY BYPASS GRAFTING (CABG) x Four , using left internal mammary artery and right leg greater saphenous vein harvested endoscopically;  Surgeon: Owen, [name], MD;  Location: [facility] OR;  Service: Open Heart Surgery;  Laterality: N/A;  . CORONARY STENT INTERVENTION N/A 06/26/2020   Procedure: CORONARY STENT INTERVENTION;  Surgeon: Jordan, Peter M, MD;  [facility]: [facility] INVAS[facility] CV LAB;  Service: Cardiovascular;  Laterality: N/A;  . CORONARY/GRAFT ANGIOGRAPHY N/A 06/23/2020   Procedure: CORONARY/GRAFT ANGIOGRAPHY;  Surgeon: Jordan, Peter M, MD;  [facility];  Service: Cardiovascular;  Laterality: N/A;  . INTRAVASCULAR ULTRASOUND/IVUS N/A 06/26/2020   Procedure: Intravascular Ultrasound/IVUS;  Surgeon: Jordan, Peter M, MD;  [facility];  Service: Cardiovascular;  Laterality: N/A;  . RIGHT/LEFT HEART CATH AND CORONARY ANGIOGRAPHY N/A 05/17/2019   Procedure: RIGHT/LEFT HEART CATH AND CORONARY ANGIOGRAPHY;  Surgeon: Cooper, Michael, MD;  [facility];  Service: Cardiovascular;  Laterality: N/A;  . TEE WITHOUT CARDIOVERSION N/A 05/21/2019   Procedure: TRANSESOPHAGEAL ECHOCARDIOGRAM (TEE);  Surgeon: Owen, [name], MD;  Location: [facility] OR;  Service: Open Heart Surgery;  Laterality: N/A;    Current Medications: Current Meds  Medication Sig  . aspirin EC 81 MG tablet Take 1 tablet (81 mg total) by mouth daily. Swallow whole.  . atorvastatin (LIPITOR) 80 MG tablet Take 1 tablet (80 mg total) by mouth daily at 6 PM.  . clopidogrel (PLAVIX) 75 MG tablet Take 75 mg by mouth daily.   . Dimethicone (REMEDY MOISTURE BARRIER EX) Apply 1 application topically  daily as needed (scaly skin).  . levothyroxine (SYNTHROID) 112 MCG tablet Take 112 mcg by mouth daily before breakfast.   . nitroGLYCERIN (NITROSTAT) 0.4 MG SL tablet Place 1 tablet (0.4 mg total) under the tongue every 5 (five) minutes as needed for chest pain.  . predniSONE (DELTASONE) 10 MG tablet Take 5 mg by mouth daily.   . [DISCONTINUED] metoprolol succinate (TOPROL XL) 25 MG 24 hr tablet Take 0.5 tablets (12.5 mg total) by mouth daily.     Allergies:   Hydrocodone   Social History   Socioeconomic History  . Marital status: Married    Spouse name: Not on file  . Number of children: Not on file  . Years of education: Not on file  . Highest education level: Not on file  Occupational History  . Not on file  Tobacco Use  . Smoking status: Former Smoker    Types: Cigarettes    Quit date: 05/17/2019    Years since quitting: 1.1  . Smokeless tobacco: Never Used  Substance and Sexual Activity  . Alcohol use: Not Currently  . Drug use: Not on file  .  Sexual activity: Not on file  Other Topics Concern  . Not on file  Social History Narrative  . Not on file   Social Determinants of Health   Financial Resource Strain:   . Difficulty of Paying Living Expenses: Not on file  Food Insecurity:   . Worried About Programme researcher, broadcasting/film/video in the Last Year: Not on file  . Ran Out of Food in the Last Year: Not on file  Transportation Needs:   . Lack of Transportation (Medical): Not on file  . Lack of Transportation (Non-Medical): Not on file  Physical Activity:   . Days of Exercise per Week: Not on file  . Minutes of Exercise per Session: Not on file  Stress:   . Feeling of Stress : Not on file  Social Connections:   . Frequency of Communication with Friends and Family: Not on file  . Frequency of Social Gatherings with Friends and Family: Not on file  . Attends Religious Services: Not on file  . Active Member of Clubs or Organizations: Not on file  . Attends Banker  Meetings: Not on file  . Marital Status: Not on file     Family History: The patient's family history includes COPD in his father; Cancer in his mother.  ROS:   Review of Systems  Constitution: Negative for decreased appetite, fever and weight gain.  HENT: Negative for congestion, ear discharge, hoarse voice and sore throat.   Eyes: Negative for discharge, redness, vision loss in right eye and visual halos.  Cardiovascular: Negative for chest pain, dyspnea on exertion, leg swelling, orthopnea and palpitations.  Respiratory: Negative for cough, hemoptysis, shortness of breath and snoring.   Endocrine: Negative for heat intolerance and polyphagia.  Hematologic/Lymphatic: Negative for bleeding problem. Does not bruise/bleed easily.  Skin: Negative for flushing, nail changes, rash and suspicious lesions.  Musculoskeletal: Negative for arthritis, joint pain, muscle cramps, myalgias, neck pain and stiffness.  Gastrointestinal: Negative for abdominal pain, bowel incontinence, diarrhea and excessive appetite.  Genitourinary: Negative for decreased libido, genital sores and incomplete emptying.  Neurological: Negative for brief paralysis, focal weakness, headaches and loss of balance.  Psychiatric/Behavioral: Negative for altered mental status, depression and suicidal ideas.  Allergic/Immunologic: Negative for HIV exposure and persistent infections.    EKGs/Labs/Other Studies Reviewed:    The following studies were reviewed today:   EKG: None today  PCI 1. Successful PCI of the proximal to mid LAD with DES x 1 and IVUS guidance 2. Successful PCI of the distal LCX with DES x 1 3. Successful PCI of the proximal and distal OM1 with DES x 2   ZIo monitor The patient wore the monitor for 13 days 18 hours starting April 27, 2020. Indication: Palpitations  The minimum heart rate was 49 bpm, maximum heart rate was 255 bpm, and average heart rate was 78 bpm. Predominant underlying rhythm was  Sinus Rhythm. Ectopic Atrial Rhythm wasnoted.  3 Ventricular Tachycardia runs occurred, the run with the fastest interval lasting 8 beats with a maximum rate of 255 bpm, the longest lasting 9 beats with an average rate of 145 bpm.   78 Supraventricular Tachycardia runs occurred, the run with the fastest interval lasting 6 beats with a max rate of 200 bpm, the longest lasting 16.5 second with an average rate of 140 bpm.   Premature atrial complexes were rare (<1.0%). Premature Ventricular complexes were rare (<1.0%). Ventricular Bigeminy was present.  No pauses, No AV block and no atrial fibrillation  present. 18 patient triggered events: 7 associated with premature atrial complex., 6 associated with ectopic atrial rhythm, remaining associated with sinus rhythm. 7 diary events noted 3 associated with ectopic atrial rhythm, 2 associated with premature atrial complex.  This study is remarkable for the following: 1. 3 episodes of nonsustained ventricular tachycardia. 2. Paroxysmal atrial tachycardia with variable block. 3. Symptomatic ectopic atrial rhythm   TTE  IMPRESSIONS  1. Left ventricular ejection fraction, by estimation, is 60 to 65%. The  left ventricle has normal function. The left ventricle has no regional  wall motion abnormalities. There is mild concentric left ventricular  hypertrophy. Left ventricular diastolic  parameters are consistent with Grade II diastolic dysfunction  (pseudonormalization). Elevated left atrial pressure.  2. Right ventricular systolic function is normal. The right ventricular  size is normal. There is normal pulmonary artery systolic pressure.  3. The mitral valve is normal in structure. Mild mitral valve  regurgitation. No evidence of mitral stenosis.  4. The aortic valve has been repaired/replaced. Aortic valve  regurgitation is not visualized. No  aortic stenosis is present. There is a  Inspra #25 valve present in the aortic position. Procedure Date:  05/21/2019. Echo findings are consistent with normal  structure and function of the aortic valve prosthesis.  5. There is moderate dilatation of the aortic root measuring 43 mm.  6. The inferior vena cava is normal in size with greater than 50%  respiratory variability, suggesting right atrial pressure of 3 mmHg  Recent Labs: 07/31/2019: Magnesium 1.9 06/26/2020: BUN 18; Creatinine, Ser 0.98; Hemoglobin 13.8; Platelets 213; Potassium 3.9; Sodium 140  Recent Lipid Panel    Component Value Date/Time   CHOL 109 07/31/2019 0855   TRIG 80 07/31/2019 0855   HDL 33 (L) 07/31/2019 0855   CHOLHDL 3.3 07/31/2019 0855   LDLCALC 60 07/31/2019 0855    Physical Exam:    VS:  BP (!) 160/100   Pulse 70   Ht 5\' 4"  (1.626 m)   Wt 152 lb (68.9 kg)   SpO2 94%   BMI 26.09 kg/m     Wt Readings from Last 3 Encounters:  07/02/20 152 lb (68.9 kg)  06/26/20 150 lb (68 kg)  06/23/20 150 lb (68 kg)     GEN: Well nourished, well developed in no acute distress HEENT: Normal NECK: No JVD; No carotid bruits LYMPHATICS: No lymphadenopathy CARDIAC: S1S2 noted,RRR, no murmurs, rubs, gallops RESPIRATORY:  Clear to auscultation without rales, wheezing or rhonchi  ABDOMEN: Soft, non-tender, non-distended, +bowel sounds, no guarding. EXTREMITIES: No edema, No cyanosis, no clubbing MUSCULOSKELETAL:  No deformity  SKIN: Warm and dry NEUROLOGIC:  Alert and oriented x 3, non-focal PSYCHIATRIC:  Normal affect, good insight  ASSESSMENT:    1. Essential hypertension   2. Coronary artery disease involving native coronary artery of native heart with angina pectoris (HCC)   3. Hyperlipidemia LDL goal <70   4. S/P aortic valve replacement with bioprosthetic valve    5. S/P CABG x 4   6. Tobacco abuse    PLAN:      He is status post PCI with DES to the mid LAD, distal circumflex, proximal and distal  OM1.  His pressures are elevated in the office today.  We'll stop metoprolol succinate and started patient on carvedilol 6.25 mg twice a day to help with blood pressure support.  He is on aspirin and Plavix will continue his dual antiplatelet therapy.  Continue atorvastatin 80 mg daily.  NSVT, asthma atrial tachycardia  continue patient on beta-blocker.  Recent CVA continue aspirin and statin.  Continue smoking cessation advised.   The patient is in agreement with the above plan. The patient left the office in stable condition.  The patient will follow up in 4 weeks for medication change.  Medication Adjustments/Labs and Tests Ordered: Current medicines are reviewed at length with the patient today.  Concerns regarding medicines are outlined above.  No orders of the defined types were placed in this encounter.  Meds ordered this encounter  Medications  . carvedilol (COREG) 6.25 MG tablet    Sig: Take 1 tablet (6.25 mg total) by mouth 2 (two) times daily.    Dispense:  180 tablet    Refill:  3    Patient Instructions  Medication Instructions:  1) Stop Metoprolol   2) Start Carvedilol (Coreg) 6.25 mg twice a day   *If you need a refill on your cardiac medications before your next appointment, please call your pharmacy*   Lab Work: None ordered   If you have labs (blood work) drawn today and your tests are completely normal, you will receive your results only by: [name] MyChart Message (if you have MyChart) OR . A paper copy in the mail If you have any lab test that is abnormal or we need to change your treatment, we will call you to review the results.   Testing/Procedures: None ordered    Follow-Up: At [facility], you and your health needs are our priority.  As part of our continuing mission to provide you with exceptional heart care, we have created designated Provider Care Teams.  These Care Teams include your primary Cardiologist (physician) and Advanced Practice  Providers (APPs -  Physician Assistants and Nurse Practitioners) who all work together to provide you with the care you need, when you need it.  We recommend signing up for the patient portal called "MyChart".  Sign up information is provided on this After Visit Summary.  MyChart is used to connect with patients for Virtual Visits (Telemedicine).  Patients are able to view lab/test results, encounter notes, upcoming appointments, etc.  Non-urgent messages can be sent to your provider as well.   To learn more about what you can do with MyChart, go to [email or web address].    Your next appointment:   4 week(s)  The format for your next appointment:   In Person  Provider:   [name], DO   Other Instructions None       Adopting a Healthy Lifestyle.  Know what a healthy weight is for you (roughly BMI <25) and aim to maintain this   Aim for 7+ servings of fruits and vegetables daily   65-80+ fluid ounces of water or unsweet tea for healthy kidneys   Limit to max 1 drink of alcohol per day; avoid smoking/tobacco   Limit animal fats in diet for cholesterol and heart health - choose grass fed whenever available   Avoid highly processed foods, and foods high in saturated/trans fats   Aim for low stress - take time to unwind and care for your mental health   Aim for 150 min of moderate intensity exercise weekly for heart health, and weights twice weekly for bone health   Aim for 7-9 hours of sleep daily   When it comes to diets, agreement about the perfect plan isnt easy to find, even among the experts. Experts at the [facility] of [employer] developed an idea known as the Healthy Eating Plate. Just imagine  a plate divided into logical, healthy portions.   The emphasis is on diet quality:   Load up on vegetables and fruits - one-half of your plate: Aim for color and variety, and remember that potatoes dont count.   Go for whole grains - one-quarter of your plate:  Whole wheat, barley, wheat berries, quinoa, oats, brown rice, and foods made with them. If you want pasta, go with whole wheat pasta.   Protein power - one-quarter of your plate: Fish, chicken, beans, and nuts are all healthy, versatile protein sources. Limit red meat.   The diet, however, does go beyond the plate, offering a few other suggestions.   Use healthy plant oils, such as olive, canola, soy, corn, sunflower and peanut. Check the labels, and avoid partially hydrogenated oil, which have unhealthy trans fats.   If youre thirsty, drink water. Coffee and tea are good in moderation, but skip sugary drinks and limit milk and dairy products to one or two daily servings.   The type of carbohydrate in the diet is more important than the amount. Some sources of carbohydrates, such as vegetables, fruits, whole grains, and beans-are healthier than others.   Finally, stay active  Signed, [name], DO  07/02/2020 2:25 PM    [facility]

## 2020-07-02 NOTE — Patient Instructions (Signed)
Medication Instructions:  1) Stop Metoprolol   2) Start Carvedilol (Coreg) 6.25 mg twice a day   *If you need a refill on your cardiac medications before your next appointment, please call your pharmacy*   Lab Work: None ordered   If you have labs (blood work) drawn today and your tests are completely normal, you will receive your results only by: [name] MyChart Message (if you have MyChart) OR . A paper copy in the mail If you have any lab test that is abnormal or we need to change your treatment, we will call you to review the results.   Testing/Procedures: None ordered    Follow-Up: At [facility], you and your health needs are our priority.  As part of our continuing mission to provide you with exceptional heart care, we have created designated Provider Care Teams.  These Care Teams include your primary Cardiologist (physician) and Advanced Practice Providers (APPs -  Physician Assistants and Nurse Practitioners) who all work together to provide you with the care you need, when you need it.  We recommend signing up for the patient portal called "MyChart".  Sign up information is provided on this After Visit Summary.  MyChart is used to connect with patients for Virtual Visits (Telemedicine).  Patients are able to view lab/test results, encounter notes, upcoming appointments, etc.  Non-urgent messages can be sent to your provider as well.   To learn more about what you can do with MyChart, go to [email or web address].    Your next appointment:   4 week(s)  The format for your next appointment:   In Person  Provider:   [name], DO   Other Instructions None

## 2020-08-07 ENCOUNTER — Other Ambulatory Visit: Payer: Self-pay

## 2020-08-07 ENCOUNTER — Encounter: Payer: Self-pay | Admitting: Cardiology

## 2020-08-07 ENCOUNTER — Ambulatory Visit (INDEPENDENT_AMBULATORY_CARE_PROVIDER_SITE_OTHER): Payer: Medicare Other | Admitting: Cardiology

## 2020-08-07 VITALS — BP 122/60 | HR 50 | Ht 64.0 in | Wt 156.4 lb

## 2020-08-07 DIAGNOSIS — Z951 Presence of aortocoronary bypass graft: Secondary | ICD-10-CM | POA: Diagnosis not present

## 2020-08-07 DIAGNOSIS — I4729 Other ventricular tachycardia: Secondary | ICD-10-CM

## 2020-08-07 DIAGNOSIS — I25119 Atherosclerotic heart disease of native coronary artery with unspecified angina pectoris: Secondary | ICD-10-CM | POA: Diagnosis not present

## 2020-08-07 DIAGNOSIS — I10 Essential (primary) hypertension: Secondary | ICD-10-CM

## 2020-08-07 DIAGNOSIS — I471 Supraventricular tachycardia: Secondary | ICD-10-CM

## 2020-08-07 DIAGNOSIS — Z953 Presence of xenogenic heart valve: Secondary | ICD-10-CM

## 2020-08-07 DIAGNOSIS — I351 Nonrheumatic aortic (valve) insufficiency: Secondary | ICD-10-CM | POA: Diagnosis not present

## 2020-08-07 DIAGNOSIS — I472 Ventricular tachycardia: Secondary | ICD-10-CM

## 2020-08-07 DIAGNOSIS — N1831 Chronic kidney disease, stage 3a: Secondary | ICD-10-CM

## 2020-08-07 DIAGNOSIS — E785 Hyperlipidemia, unspecified: Secondary | ICD-10-CM

## 2020-08-07 MED ORDER — ISOSORBIDE MONONITRATE ER 30 MG PO TB24: 15.0000 mg | ORAL_TABLET | Freq: Every day | ORAL | 3 refills | Status: DC

## 2020-08-07 NOTE — Patient Instructions (Addendum)
Medication Instructions:  Your physician has recommended you make the following change in your medication:  -- START Isosorbide (Imdur) 15 mg - Take 0.5 tablet (15 mg) by mouth daily -- NEW RX SENT--  *If you need a refill on your cardiac medications before your next appointment, please call your pharmacy*  Follow-Up: At [facility], you and your health needs are our priority.  As part of our continuing mission to provide you with exceptional heart care, we have created designated Provider Care Teams.  These Care Teams include your primary Cardiologist (physician) and Advanced Practice Providers (APPs -  Physician Assistants and Nurse Practitioners) who all work together to provide you with the care you need, when you need it.  We recommend signing up for the patient portal called "MyChart".  Sign up information is provided on this After Visit Summary.  MyChart is used to connect with patients for Virtual Visits (Telemedicine).  Patients are able to view lab/test results, encounter notes, upcoming appointments, etc.  Non-urgent messages can be sent to your provider as well.   To learn more about what you can do with MyChart, go to [email or web address].    Your next appointment:   Your physician recommends that you schedule a follow-up appointment in: 3 MONTHS with Dr. Servando Salina  The format for your next appointment:   In Person with [name], DO

## 2020-08-07 NOTE — Progress Notes (Signed)
Cardiology Office Note:    Date:  08/07/2020   ID:  Dennis Gomez, DOB [birth year], MRN [id number]  PCP:  [name], MD  Cardiologist:  [name], DO  Electrophysiologist:  None   Referring MD: [name], MD   " I am having some chest pain"  History of Present Illness:    Dennis Gomez is a 69 y.o. male with a hx of of COPD, tobacco abuse(quit), hypothroidism,HTN, acute CHF/LV dysfunction (EF now 60-65% 08/2019- improvement from 35-40%), moderate-severe aortic regurgitation s/p AVRwith inspiris 72mm bioprosthesis, CAD s/p CABGX4 with recent left heart catheterization DES x4 to the mid LAD, 1st marginal, 2nd marginal, and to the L PDApresents for follow up.  I last saw the patient January 31, 2020 at that time he appeared to be stable from a cardiovascular standpoint he had resolved his nosebleed. We will continue his medication including aspirin statin and recommended follow-up in 6 months.  Per records on May 14 he was doing work when he suddenly experienced loss of vision in his right eye. 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 The etiology was unknown but there was concern about cryptogenic stroke as well. He was discharged on aspirin and Plavix for 30 days at which time he was asked to stop the aspirin continue Plavix. He also kept on his Lipitor to milligrams daily.  I saw the patient April 27, 2020 at that time giving his interim report of being hospitalized at [facility] and questionable PFO I had the patient get a echo with bubble study. Also at the time discussed with the patient he had a TEE doing his valve surgery and it did not show any PFO.  He was able to get his transthoracic echocardiogram done with continued saline study and this was negative for PFO. In addition due to the concern for cryptogenic stroke a  monitor was placed on the patient.  I did see the patient on June 11, 2020 at that time he reported some typical angina symptoms.  In addition I had the patient were monitored due to dizziness which showed multiple episodes of nonsustained ventricular tachycardia.  I saw the patient on July 02, 2020 at that time he was status post left heart catheterization and he was also hypertensive.  I stopped his metoprolol succinate and started the patient on carvedilol 6.25 mg twice daily.  Since his visit he tells me that he has had some intermittent chest pain but had stopped his Imdur.  In addition he does have some shortness of breath but has run out of his inhalers.  He tells me they are unable to afford the inhalers.   Past Medical History:  Diagnosis Date  . Acute kidney injury (HCC) 05/17/2019  . Aortic regurgitation   . Chest pain 05/16/2019  . Chronic combined systolic and diastolic CHF (congestive heart failure) (HCC)    a. LVEF 35-40% at [place] in 04/2019, 45-50% at [facility] pre-AVR. 25-30% by repeat echo 05/2019.  [name] Chronic kidney disease (CKD), stage III (moderate) (HCC)   . Chronic systolic congestive heart failure (HCC)   . COPD (chronic obstructive pulmonary disease) (HCC)   . Coronary artery disease    a. s/p CABGx4 in 05/2019.  [name] Essential hypertension   . Hyperlipidemia LDL goal <70   . Hypothyroid   . Hypothyroidism 05/16/2019  . PAD (peripheral artery disease) (HCC)  a. R/L ABIs indicated mild lower extremity arterial disease and carotid duplex showed 1-39% stenosis.  [name] Postoperative atrial fibrillation (HCC)   . S/P aortic valve replacement with bioprosthetic valve 05/21/2019   25 mm Edwards Inspiris Resilia stented bovine pericardial tissue valve  . 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 Severe aortic regurgitation   . Syncope 06/17/2019  . Tobacco abuse     Past Surgical History:  Procedure  Laterality Date  . AORTIC VALVE REPLACEMENT N/A 05/21/2019   Procedure: AORTIC VALVE REPLACEMENT (AVR) using Inspiris Valve size 25mm;  Surgeon: Purcell Nails, MD;  Location: [facility] OR;  Service: Open Heart Surgery;  Laterality: N/A;  . CORONARY ANGIOGRAPHY N/A 06/26/2020   Procedure: CORONARY ANGIOGRAPHY;  Surgeon: Swaziland, Peter M, MD;  Location[facility] INVASIVE CV LAB;  Service: Cardiovascular;  Laterality: N/A;  . CORONARY ARTERY BYPASS GRAFT N/A 05/21/2019   Procedure: CORONARY ARTERY BYPASS GRAFTING (CABG) x Four , using left internal mammary artery and right leg greater saphenous vein harvested endoscopically;  Surgeon: Purcell Nails, MD;  Location[facility] OR;  Service: Open Heart Surgery;  Laterality: N/A;  . CORONARY STENT INTERVENTION N/A 06/26/2020   Procedure: CORONARY STENT INTERVENTION;  Surgeon: Swaziland, Peter M, MD;  Location: [facility] INVASIVE CV LAB;  Service: Cardiovascular;  Laterality: N/A;  . CORONARY/GRAFT ANGIOGRAPHY N/A 06/23/2020   Procedure: CORONARY/GRAFT ANGIOGRAPHY;  Surgeon: Swaziland, Peter M, MD;  Location: [facility] INVASIVE CV LAB;  Service: Cardiovascular;  Laterality: N/A;  . INTRAVASCULAR ULTRASOUND/IVUS N/A 06/26/2020   Procedure: Intravascular Ultrasound/IVUS;  Surgeon: Swaziland, Peter M, MD;  Location: [facility] INVASIVE CV LAB;  Service: Cardiovascular;  Laterality: N/A;  . RIGHT/LEFT HEART CATH AND CORONARY ANGIOGRAPHY N/A 05/17/2019   Procedure: RIGHT/LEFT HEART CATH AND CORONARY ANGIOGRAPHY;  Surgeon: Tonny Bollman, MD;  Location: [facility] INVASIVE CV LAB;  Service: Cardiovascular;  Laterality: N/A;  . TEE WITHOUT CARDIOVERSION N/A 05/21/2019   Procedure: TRANSESOPHAGEAL ECHOCARDIOGRAM (TEE);  Surgeon: Purcell Nails, MD;  Location: [facility] OR;  Service: Open Heart Surgery;  Laterality: N/A;    Current Medications: Current Meds  Medication Sig  . aspirin EC 81 MG tablet Take 1 tablet (81 mg total) by mouth daily. Swallow whole.  [name] atorvastatin (LIPITOR) 80 MG tablet Take 1 tablet (80 mg total) by  mouth daily at 6 PM.  . carvedilol (COREG) 6.25 MG tablet Take 1 tablet (6.25 mg total) by mouth 2 (two) times daily.  . clopidogrel (PLAVIX) 75 MG tablet Take 75 mg by mouth daily.   [name] levothyroxine (SYNTHROID) 112 MCG tablet Take 112 mcg by mouth daily before breakfast.   . nitroGLYCERIN (NITROSTAT) 0.4 MG SL tablet Place 1 tablet (0.4 mg total) under the tongue every 5 (five) minutes as needed for chest pain.  . [DISCONTINUED] metoprolol tartrate (LOPRESSOR) 25 MG tablet Take 25 mg by mouth daily.     Allergies:   Hydrocodone, Famotidine, and Prednisone   Social History   Socioeconomic History  . Marital status: Married    Spouse name: Not on file  . Number of children: Not on file  . Years of education: Not on file  . Highest education level: Not on file  Occupational History  . Not on file  Tobacco Use  . Smoking status: Former Smoker    Types: Cigarettes    Quit date: 05/17/2019    Years since quitting: 1.2  .  Smokeless tobacco: Never Used  Substance and Sexual Activity  . Alcohol use: Not Currently  . Drug use: Not on file  . Sexual activity: Not on file  Other Topics Concern  . Not on file  Social History Narrative  . Not on file   Social Determinants of Health   Financial Resource Strain:   . Difficulty of Paying Living Expenses: Not on file  Food Insecurity:   . Worried About Programme researcher, broadcasting/film/video in the Last Year: Not on file  . Ran Out of Food in the Last Year: Not on file  Transportation Needs:   . Lack of Transportation (Medical): Not on file  . Lack of Transportation (Non-Medical): Not on file  Physical Activity:   . Days of Exercise per Week: Not on file  . Minutes of Exercise per Session: Not on file  Stress:   . Feeling of Stress : Not on file  Social Connections:   . Frequency of Communication with Friends and Family: Not on file  . Frequency of Social Gatherings with Friends and Family: Not on file  . Attends Religious Services: Not on file  .  Active Member of Clubs or Organizations: Not on file  . Attends Banker Meetings: Not on file  . Marital Status: Not on file     Family History: The patient's family history includes COPD in his father; Cancer in his mother.  ROS:   Review of Systems  Constitution: Negative for decreased appetite, fever and weight gain.  HENT: Negative for congestion, ear discharge, hoarse voice and sore throat.   Eyes: Negative for discharge, redness, vision loss in right eye and visual halos.  Cardiovascular: Negative for chest pain, dyspnea on exertion, leg swelling, orthopnea and palpitations.  Respiratory: Negative for cough, hemoptysis, shortness of breath and snoring.   Endocrine: Negative for heat intolerance and polyphagia.  Hematologic/Lymphatic: Negative for bleeding problem. Does not bruise/bleed easily.  Skin: Negative for flushing, nail changes, rash and suspicious lesions.  Musculoskeletal: Negative for arthritis, joint pain, muscle cramps, myalgias, neck pain and stiffness.  Gastrointestinal: Negative for abdominal pain, bowel incontinence, diarrhea and excessive appetite.  Genitourinary: Negative for decreased libido, genital sores and incomplete emptying.  Neurological: Negative for brief paralysis, focal weakness, headaches and loss of balance.  Psychiatric/Behavioral: Negative for altered mental status, depression and suicidal ideas.  Allergic/Immunologic: Negative for HIV exposure and persistent infections.    EKGs/Labs/Other Studies Reviewed:    The following studies were reviewed today:   EKG:  The ekg ordered today demonstrates   Recent Labs: 06/26/2020: BUN 18; Creatinine, Ser 0.98; Hemoglobin 13.8; Platelets 213; Potassium 3.9; Sodium 140  Recent Lipid Panel    Component Value Date/Time   CHOL 109 07/31/2019 0855   TRIG 80 07/31/2019 0855   HDL 33 (L) 07/31/2019 0855   CHOLHDL 3.3 07/31/2019 0855   LDLCALC 60 07/31/2019 0855    Physical Exam:    VS:   BP 122/60   Pulse (!) 50   Ht 5\' 4"  (1.626 m)   Wt 156 lb 6.4 oz (70.9 kg)   SpO2 96%   BMI 26.85 kg/m     Wt Readings from Last 3 Encounters:  08/07/20 156 lb 6.4 oz (70.9 kg)  07/02/20 152 lb (68.9 kg)  06/26/20 150 lb (68 kg)     GEN: Well nourished, well developed in no acute distress HEENT: Normal NECK: No JVD; No carotid bruits LYMPHATICS: No lymphadenopathy CARDIAC: S1S2 noted,RRR, no murmurs, rubs, gallops  RESPIRATORY:  Clear to auscultation without rales, wheezing or rhonchi  ABDOMEN: Soft, non-tender, non-distended, +bowel sounds, no guarding. EXTREMITIES: No edema, No cyanosis, no clubbing MUSCULOSKELETAL:  No deformity  SKIN: Warm and dry NEUROLOGIC:  Alert and oriented x 3, non-focal PSYCHIATRIC:  Normal affect, good insight  ASSESSMENT:    1. Severe aortic regurgitation   2. Essential hypertension   3. Coronary artery disease involving native coronary artery of native heart with angina pectoris (HCC)   4. S/P CABG x 4   5. S/P aortic valve replacement with bioprosthetic valve    6. Hyperlipidemia LDL goal <70   7. Stage 3a chronic kidney disease (HCC)   8. PAT (paroxysmal atrial tachycardia) (HCC)   9. NSVT (nonsustained ventricular tachycardia) (HCC)    PLAN:      We will place the patient back on his Imdur to help with his stable angina.  Thankfully his blood pressure has improved and is below target of 130/80 millimeters mercury.  He will remain on his current regimen for blood pressure which includes carvedilol 6.25 mg twice daily. Continue his dual antiplatelet therapy. Continue current statin medication.  His shortness of breath I think is in the setting of his COPD that is not being treated.  He see his PCP soon and have told him to discuss with his PCP about inhaler options.  Continued smoking cessation advised.   The patient is in agreement with the above plan. The patient left the office in stable condition.  The patient will follow up  in   Medication Adjustments/Labs and Tests Ordered: Current medicines are reviewed at length with the patient today.  Concerns regarding medicines are outlined above.  No orders of the defined types were placed in this encounter.  Meds ordered this encounter  Medications  . isosorbide mononitrate (IMDUR) 30 MG 24 hr tablet    Sig: Take 0.5 tablets (15 mg total) by mouth daily.    Dispense:  45 tablet    Refill:  3    Patient Instructions  Medication Instructions:  Your physician has recommended you make the following change in your medication:  -- START Isosorbide (Imdur) 15 mg - Take 0.5 tablet (15 mg) by mouth daily -- NEW RX SENT--  *If you need a refill on your cardiac medications before your next appointment, please call your pharmacy*  Follow-Up: At [facility], you and your health needs are our priority.  As part of our continuing mission to provide you with exceptional heart care, we have created designated Provider Care Teams.  These Care Teams include your primary Cardiologist (physician) and Advanced Practice Providers (APPs -  Physician Assistants and Nurse Practitioners) who all work together to provide you with the care you need, when you need it.  We recommend signing up for the patient portal called "MyChart".  Sign up information is provided on this After Visit Summary.  MyChart is used to connect with patients for Virtual Visits (Telemedicine).  Patients are able to view lab/test results, encounter notes, upcoming appointments, etc.  Non-urgent messages can be sent to your provider as well.   To learn more about what you can do with MyChart, go to [email or web address].    Your next appointment:   Your physician recommends that you schedule a follow-up appointment in: 3 MONTHS with Dr. Servando Salina  The format for your next appointment:   In Person with [name], DO        Adopting a Healthy Lifestyle.  Know what a  healthy weight is for you (roughly BMI <25)  and aim to maintain this   Aim for 7+ servings of fruits and vegetables daily   65-80+ fluid ounces of water or unsweet tea for healthy kidneys   Limit to max 1 drink of alcohol per day; avoid smoking/tobacco   Limit animal fats in diet for cholesterol and heart health - choose grass fed whenever available   Avoid highly processed foods, and foods high in saturated/trans fats   Aim for low stress - take time to unwind and care for your mental health   Aim for 150 min of moderate intensity exercise weekly for heart health, and weights twice weekly for bone health   Aim for 7-9 hours of sleep daily   When it comes to diets, agreement about the perfect plan isnt easy to find, even among the experts. Experts at the [facility] [employer] developed an idea known as the Healthy Eating Plate. Just imagine a plate divided into logical, healthy portions.   The emphasis is on diet quality:   Load up on vegetables and fruits - one-half of your plate: Aim for color and variety, and remember that potatoes dont count.   Go for whole grains - one-quarter of your plate: Whole wheat, barley, wheat berries, quinoa, oats, brown rice, and foods made with them. If you want pasta, go with whole wheat pasta.   Protein power - one-quarter of your plate: Fish, chicken, beans, and nuts are all healthy, versatile protein sources. Limit red meat.   The diet, however, does go beyond the plate, offering a few other suggestions.   Use healthy plant oils, such as olive, canola, soy, corn, sunflower and peanut. Check the labels, and avoid partially hydrogenated oil, which have unhealthy trans fats.   If youre thirsty, drink water. Coffee and tea are good in moderation, but skip sugary drinks and limit milk and dairy products to one or two daily servings.   The type of carbohydrate in the diet is more important than the amount. Some sources of carbohydrates, such as vegetables, fruits, whole grains, and  beans-are healthier than others.   Finally, stay active  Signed, [name], DO  08/07/2020 11:56 AM    [facility]

## 2020-09-21 ENCOUNTER — Other Ambulatory Visit: Payer: Self-pay | Admitting: Cardiology

## 2020-09-21 MED ORDER — CARVEDILOL 6.25 MG PO TABS
6.2500 mg | ORAL_TABLET | Freq: Two times a day (BID) | ORAL | 3 refills | Status: DC
Start: 2020-09-21 — End: 2021-08-31

## 2020-09-21 NOTE — Telephone Encounter (Signed)
Refill sent in per request.  

## 2020-09-21 NOTE — Telephone Encounter (Signed)
*  STAT* If patient is at the pharmacy, call can be transferred to refill team.   1. Which medications need to be refilled? (please list name of each medication and dose if known) carvedilol 6.25 mg  2. Which pharmacy/location (including street and city if local pharmacy) is medication to be sent to? [facility]  3. Do they need a 30 day or 90 day supply? 90

## 2020-09-28 ENCOUNTER — Telehealth: Payer: Self-pay | Admitting: Cardiology

## 2020-09-28 NOTE — Telephone Encounter (Signed)
° °  [facility] Pre-operative Risk Assessment    HEARTCARE STAFF: - Please ensure there is not already an duplicate clearance open for this procedure. - Under Visit Info/Reason for Call, type in Other and utilize the format Clearance MM/DD/YY or Clearance TBD. Do not use dashes or single digits. - If request is for dental extraction, please clarify the # of teeth to be extracted.  Request for surgical clearance:  1. What type of surgery is being performed? Colonoscopy   2. When is this surgery scheduled? TBD  3. What type of clearance is required (medical clearance vs. Pharmacy clearance to hold med vs. Both)? both  4. Are there any medications that need to be held prior to surgery and how long? Plavix 5 days prior  5. Practice name and name of physician performing surgery? Dr. Lyda Jester at [place]  6. What is the office phone number? [phone] ext [phone]   7.   What is the office fax number? [phone]  8.   Anesthesia type (None, local, MAC, general) ? Propofol    Leah Newnam 09/28/2020, 10:03 AM  _________________________________________________________________   (provider comments below)

## 2020-09-29 NOTE — Telephone Encounter (Signed)
Dr. Servando Salina We have received a request to hold plavix for colonoscopy. Pt had recent DES x 3 on 06/26/20. I reached out to the patient and GI to confirm urgency of colonoscopy. The pt states that GI knows he can't stop plavix for at least 6 months. The colonoscopy was ordered for blood found on cologuard. I was unable to reach GI to clarify the request.  Can you please comment on the earliest date he may hold plavix?

## 2020-10-06 ENCOUNTER — Telehealth: Payer: Self-pay

## 2020-10-06 NOTE — Telephone Encounter (Signed)
Dr. Servando Salina please see and review Micah Flesher PA-C's previous question.  Please advise on holding Plavix.  Thank you for your help.  Please direct response to CV DIV preop pool.  [name]. Alquan Morrish NP-C    10/06/2020, 7:37 AM [facility] [address] Office [phone] Fax [phone]

## 2020-10-06 NOTE — Telephone Encounter (Signed)
Mr. Corrales chart is been reviewed. The earliest date that he would be able to hold his Plavix would be January 24, 2021. His colonoscopy will have to be postponed until that time. I will remove his clearance request from the preoperative pool. Please have them resubmit closer to the date of the procedure. Thank you.  [name]. Saleha Kalp NP-C    10/06/2020, 8:51 AM [facility] [address] Office [phone] Fax [phone]

## 2020-10-06 NOTE — Telephone Encounter (Signed)
Spoke with Clydie Braun from [facility] . To Advise  Procedure to be postponed due to time when Plavix can be held.

## 2020-10-06 NOTE — Telephone Encounter (Signed)
The earliest that would be acceptable will be after January 24, 2021.

## 2020-11-10 DIAGNOSIS — I4891 Unspecified atrial fibrillation: Secondary | ICD-10-CM | POA: Insufficient documentation

## 2020-11-10 DIAGNOSIS — I739 Peripheral vascular disease, unspecified: Secondary | ICD-10-CM | POA: Insufficient documentation

## 2020-11-10 DIAGNOSIS — I5042 Chronic combined systolic (congestive) and diastolic (congestive) heart failure: Secondary | ICD-10-CM | POA: Insufficient documentation

## 2020-11-10 DIAGNOSIS — E039 Hypothyroidism, unspecified: Secondary | ICD-10-CM | POA: Insufficient documentation

## 2020-11-11 ENCOUNTER — Encounter: Payer: Self-pay | Admitting: Cardiology

## 2020-11-11 ENCOUNTER — Ambulatory Visit (INDEPENDENT_AMBULATORY_CARE_PROVIDER_SITE_OTHER): Payer: Medicare Other | Admitting: Cardiology

## 2020-11-11 ENCOUNTER — Other Ambulatory Visit: Payer: Self-pay

## 2020-11-11 VITALS — BP 136/70 | HR 76 | Ht 64.0 in | Wt 157.4 lb

## 2020-11-11 DIAGNOSIS — I351 Nonrheumatic aortic (valve) insufficiency: Secondary | ICD-10-CM | POA: Diagnosis not present

## 2020-11-11 DIAGNOSIS — I25119 Atherosclerotic heart disease of native coronary artery with unspecified angina pectoris: Secondary | ICD-10-CM

## 2020-11-11 DIAGNOSIS — Z955 Presence of coronary angioplasty implant and graft: Secondary | ICD-10-CM

## 2020-11-11 DIAGNOSIS — Z951 Presence of aortocoronary bypass graft: Secondary | ICD-10-CM

## 2020-11-11 DIAGNOSIS — Z953 Presence of xenogenic heart valve: Secondary | ICD-10-CM | POA: Diagnosis not present

## 2020-11-11 DIAGNOSIS — I639 Cerebral infarction, unspecified: Secondary | ICD-10-CM

## 2020-11-11 DIAGNOSIS — E785 Hyperlipidemia, unspecified: Secondary | ICD-10-CM

## 2020-11-11 DIAGNOSIS — I471 Supraventricular tachycardia: Secondary | ICD-10-CM

## 2020-11-11 DIAGNOSIS — N1831 Chronic kidney disease, stage 3a: Secondary | ICD-10-CM

## 2020-11-11 DIAGNOSIS — Z720 Tobacco use: Secondary | ICD-10-CM

## 2020-11-11 DIAGNOSIS — I10 Essential (primary) hypertension: Secondary | ICD-10-CM

## 2020-11-11 DIAGNOSIS — I4729 Other ventricular tachycardia: Secondary | ICD-10-CM

## 2020-11-11 DIAGNOSIS — N183 Chronic kidney disease, stage 3 unspecified: Secondary | ICD-10-CM

## 2020-11-11 DIAGNOSIS — I472 Ventricular tachycardia: Secondary | ICD-10-CM

## 2020-11-11 MED ORDER — CLOPIDOGREL BISULFATE 75 MG PO TABS
75.0000 mg | ORAL_TABLET | Freq: Every day | ORAL | 2 refills | Status: DC
Start: 2020-11-11 — End: 2021-02-24

## 2020-11-11 NOTE — Progress Notes (Signed)
Cardiology Office Note:    Date:  11/11/2020   ID:  Lansana Guzman, DOB 10/14/1951, MRN [id number]  PCP:  [name], MD  Cardiologist:  [name], DO  Electrophysiologist:  None   Referring MD: [name], MD   I am doing fine  History of Present Illness:    Derien Gennaro is a 70 y.o. male with a hx of COPD, tobacco abuse(quit), hypothroidism,HTN, acute CHF/LV dysfunction (EF now 60-65% 08/2019- improvement from 35-40%), moderate-severe aortic regurgitation s/p AVRwith inspiris 75mm bioprosthesis, CAD s/p CABGX4with recent left heart catheterization DES x4 to the mid LAD, 1st marginal, 2nd marginal, and to the L PDApresents for follow up.  I last saw the patient January 31, 2020 at that time he appeared to be stable from a cardiovascular standpoint he had resolved his nosebleed. We will continue his medication including aspirin statin and recommended follow-up in 6 months.  Per records on May 14 he was doing work when he suddenly experienced loss of vision in his right eye. 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 The etiology was unknown but there was concern about cryptogenic stroke as well. He was discharged on aspirin and Plavix for 30 days at which time he was asked to stop the aspirin continue Plavix. He also kept on his Lipitor to milligrams daily.  I saw the patient April 27, 2020 at that time giving his interim report of being hospitalized at [facility] and questionable PFO I had the patient get a echo with bubble study. Also at the time discussed with the patient he had a TEE doing his valve surgery and it did not show any PFO.  He was able to get his transthoracic echocardiogram done with continued saline study and this was negative for PFO. In addition due to the concern for cryptogenic stroke a monitor was placed  on the patient.  I did see the patient on June 11, 2020 at that time he reported some typical angina symptoms. In addition I had the patient were monitored due to dizziness which showed multiple episodes of nonsustained ventricular tachycardia.  I saw the patient on July 02, 2020 at that time he was status post left heart catheterization and he was also hypertensive.  I stopped his metoprolol succinate and started the patient on carvedilol 6.25 mg twice daily.   Also the patient on August 07, 2020 at that time he had stopped his Imdur and started experiencing some intermittent chest pain so I restarted his Imdur during our visit.  He tells me today he is doing well he has some intermittent shortness of breath when he is cutting wood but other than that he is doing okay.  His wife is here with him today who also confirms this.  Past Medical History:  Diagnosis Date  . Acute kidney injury (HCC) 05/17/2019  . Aortic regurgitation   . Chest pain 05/16/2019  . Chronic combined systolic and diastolic CHF (congestive heart failure) (HCC)    a. LVEF 35-40% at [place] in 04/2019, 45-50% at [facility] pre-AVR. 25-30% by repeat echo 05/2019.  [name] Chronic kidney disease (CKD), stage III (moderate) (HCC)   . Chronic systolic congestive heart failure (HCC)   . COPD (chronic obstructive pulmonary disease) (HCC)   . Coronary artery disease    a. s/p CABGx4 in 05/2019.  [name] Essential hypertension   . Hyperlipidemia LDL goal <70   .  Hypothyroid   . Hypothyroidism 05/16/2019  . PAD (peripheral artery disease) (HCC)    a. R/L ABIs indicated mild lower extremity arterial disease and carotid duplex showed 1-39% stenosis.  [name]. Postoperative atrial fibrillation (HCC)   . S/P aortic valve replacement with bioprosthetic valve 05/21/2019   25 mm Edwards Inspiris Resilia stented bovine pericardial tissue valve  . 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  Severe aortic regurgitation   . Syncope 06/17/2019  . Tobacco abuse     Past Surgical History:  Procedure Laterality Date  . AORTIC VALVE REPLACEMENT N/A 05/21/2019   Procedure: AORTIC VALVE REPLACEMENT (AVR) using Inspiris Valve size 25mm;  Surgeon: Purcell Nailswen, [name], MD;  [facility];  Service: Open Heart Surgery;  Laterality: N/A;  . CORONARY ANGIOGRAPHY N/A 06/26/2020   Procedure: CORONARY ANGIOGRAPHY;  Surgeon: SwazilandJordan, Peter M, MD;  [facility];  Service: Cardiovascular;  Laterality: N/A;  . CORONARY ARTERY BYPASS GRAFT N/A 05/21/2019   Procedure: CORONARY ARTERY BYPASS GRAFTING (CABG) x Four , using left internal mammary artery and right leg greater saphenous vein harvested endoscopically;  Surgeon: Purcell Nailswen, [name], MD;  [facility];  Service: Open Heart Surgery;  Laterality: N/A;  . CORONARY STENT INTERVENTION N/A 06/26/2020   Procedure: CORONARY STENT INTERVENTION;  Surgeon: SwazilandJordan, Peter M, MD;  [facility];  Service: Cardiovascular;  Laterality: N/A;  . CORONARY/GRAFT ANGIOGRAPHY N/A 06/23/2020   Procedure: CORONARY/GRAFT ANGIOGRAPHY;  Surgeon: SwazilandJordan, Peter M, MD;  [facility];  Service: Cardiovascular;  Laterality: N/A;  . INTRAVASCULAR ULTRASOUND/IVUS N/A 06/26/2020   Procedure: Intravascular Ultrasound/IVUS;  Surgeon: SwazilandJordan, Peter M, MD;  [facility];  Service: Cardiovascular;  Laterality: N/A;  . RIGHT/LEFT HEART CATH AND CORONARY ANGIOGRAPHY N/A 05/17/2019   Procedure: RIGHT/LEFT HEART CATH AND CORONARY ANGIOGRAPHY;  Surgeon: Tonny Bollmanooper, Michael, MD;  [facility];  Service: Cardiovascular;  Laterality: N/A;  . TEE WITHOUT CARDIOVERSION N/A 05/21/2019   Procedure: TRANSESOPHAGEAL ECHOCARDIOGRAM (TEE);  Surgeon: Purcell Nailswen, [name], MD;  [facility];  Service: Open Heart Surgery;  Laterality: N/A;    Current Medications: Current Meds  Medication Sig  . aspirin EC 81 MG tablet Take 1 tablet (81 mg  total) by mouth daily. Swallow whole.  [name]. atorvastatin (LIPITOR) 80 MG tablet Take 1 tablet (80 mg total) by mouth daily at 6 PM.  . carvedilol (COREG) 6.25 MG tablet Take 1 tablet (6.25 mg total) by mouth 2 (two) times daily.  . isosorbide mononitrate (IMDUR) 30 MG 24 hr tablet Take 0.5 tablets (15 mg total) by mouth daily.  [name]. levothyroxine (SYNTHROID) 112 MCG tablet Take 112 mcg by mouth daily before breakfast.   . nitroGLYCERIN (NITROSTAT) 0.4 MG SL tablet Place 1 tablet (0.4 mg total) under the tongue every 5 (five) minutes as needed for chest pain.  . [DISCONTINUED] clopidogrel (PLAVIX) 75 MG tablet Take 75 mg by mouth daily.      Allergies:   Hydrocodone, Famotidine, and Prednisone   Social History   Socioeconomic History  . Marital status: Married    Spouse name: Not on file  . Number of children: Not on file  . Years of education: Not on file  . Highest education level: Not on file  Occupational History  . Not on file  Tobacco Use  . Smoking status:  Former Smoker    Types: Cigarettes    Quit date: 05/17/2019    Years since quitting: 1.4  . Smokeless tobacco: Never Used  Substance and Sexual Activity  . Alcohol use: Not Currently  . Drug use: Not on file  . Sexual activity: Not on file  Other Topics Concern  . Not on file  Social History Narrative  . Not on file   Social Determinants of Health   Financial Resource Strain: Not on file  Food Insecurity: Not on file  Transportation Needs: Not on file  Physical Activity: Not on file  Stress: Not on file  Social Connections: Not on file     Family History: The patient's family history includes COPD in his father; Cancer in his mother.  ROS:   Review of Systems  Constitution: Negative for decreased appetite, fever and weight gain.  HENT: Negative for congestion, ear discharge, hoarse voice and sore throat.   Eyes: Negative for discharge, redness, vision loss in right eye and visual halos.  Cardiovascular: Negative  for chest pain, dyspnea on exertion, leg swelling, orthopnea and palpitations.  Respiratory: Negative for cough, hemoptysis, shortness of breath and snoring.   Endocrine: Negative for heat intolerance and polyphagia.  Hematologic/Lymphatic: Negative for bleeding problem. Does not bruise/bleed easily.  Skin: Negative for flushing, nail changes, rash and suspicious lesions.  Musculoskeletal: Negative for arthritis, joint pain, muscle cramps, myalgias, neck pain and stiffness.  Gastrointestinal: Negative for abdominal pain, bowel incontinence, diarrhea and excessive appetite.  Genitourinary: Negative for decreased libido, genital sores and incomplete emptying.  Neurological: Negative for brief paralysis, focal weakness, headaches and loss of balance.  Psychiatric/Behavioral: Negative for altered mental status, depression and suicidal ideas.  Allergic/Immunologic: Negative for HIV exposure and persistent infections.    EKGs/Labs/Other Studies Reviewed:    The following studies were reviewed today:   EKG: None today  Recent Labs: 06/26/2020: BUN 18; Creatinine, Ser 0.98; Hemoglobin 13.8; Platelets 213; Potassium 3.9; Sodium 140  Recent Lipid Panel    Component Value Date/Time   CHOL 109 07/31/2019 0855   TRIG 80 07/31/2019 0855   HDL 33 (L) 07/31/2019 0855   CHOLHDL 3.3 07/31/2019 0855   LDLCALC 60 07/31/2019 0855    Physical Exam:    VS:  BP 136/70   Pulse 76   Ht 5\' 4"  (1.626 m)   Wt 157 lb 6.4 oz (71.4 kg)   SpO2 95%   BMI 27.02 kg/m     Wt Readings from Last 3 Encounters:  11/11/20 157 lb 6.4 oz (71.4 kg)  08/07/20 156 lb 6.4 oz (70.9 kg)  07/02/20 152 lb (68.9 kg)     GEN: Well nourished, well developed in no acute distress HEENT: Normal NECK: No JVD; No carotid bruits LYMPHATICS: No lymphadenopathy CARDIAC: S1S2 noted,RRR, no murmurs, rubs, gallops RESPIRATORY:  Clear to auscultation without rales, wheezing or rhonchi  ABDOMEN: Soft, non-tender, non-distended,  +bowel sounds, no guarding. EXTREMITIES: No edema, No cyanosis, no clubbing MUSCULOSKELETAL:  No deformity  SKIN: Warm and dry NEUROLOGIC:  Alert and oriented x 3, non-focal PSYCHIATRIC:  Normal affect, good insight  ASSESSMENT:    1. Coronary artery disease involving native coronary artery of native heart with angina pectoris (HCC)   2. S/P CABG x 4   3. Severe aortic regurgitation   4. S/P aortic valve replacement with bioprosthetic valve   5. Essential hypertension   6. Hyperlipidemia LDL goal <70   7. Stage 3a chronic kidney disease (HCC)   8.  PAT (paroxysmal atrial tachycardia) (HCC)   9. NSVT (nonsustained ventricular tachycardia) (HCC)   10. Tobacco abuse   11. S/P coronary artery stent placement   12. Cryptogenic stroke (HCC)   13. Stage 3 chronic kidney disease, unspecified whether stage 3a or 3b CKD (HCC)    PLAN:     1.  He appears to be doing well from a cardiovascular standpoint.  We will continue patient on his current medication regimen.  2.  No anginal symptoms continue patient on his dual antiplatelet therapy with his atorvastatin.  3.  Blood pressure is acceptable, continue with current antihypertensive regimen.  4.  Hyperlipidemia - continue with current statin medication.  5.  Continued smoking cessation advised  6.  Avoid nephrotoxins 7.  Blood work be done for BMP, mag CBC  The patient is in agreement with the above plan. The patient left the office in stable condition.  The patient will follow up in 6 months or sooner if needed.   Medication Adjustments/Labs and Tests Ordered: Current medicines are reviewed at length with the patient today.  Concerns regarding medicines are outlined above.  Orders Placed This Encounter  Procedures  . Basic metabolic panel  . Magnesium  . CBC   Meds ordered this encounter  Medications  . clopidogrel (PLAVIX) 75 MG tablet    Sig: Take 1 tablet (75 mg total) by mouth daily.    Dispense:  60 tablet    Refill:   2    Patient Instructions  Medication Instructions:  Your physician recommends that you continue on your current medications as directed. Please refer to the Current Medication list given to you today.  *If you need a refill on your cardiac medications before your next appointment, please call your pharmacy*   Lab Work: Your physician recommends that you return for lab work in:   BMET, CBC, MAGNESIUM  If you have labs (blood work) drawn today and your tests are completely normal, you will receive your results only by: [name] MyChart Message (if you have MyChart) OR . A paper copy in the mail If you have any lab test that is abnormal or we need to change your treatment, we will call you to review the results.   Testing/Procedures: NONE   Follow-Up: At [facility], you and your health needs are our priority.  As part of our continuing mission to provide you with exceptional heart care, we have created designated Provider Care Teams.  These Care Teams include your primary Cardiologist (physician) and Advanced Practice Providers (APPs -  Physician Assistants and Nurse Practitioners) who all work together to provide you with the care you need, when you need it.  We recommend signing up for the patient portal called "MyChart".  Sign up information is provided on this After Visit Summary.  MyChart is used to connect with patients for Virtual Visits (Telemedicine).  Patients are able to view lab/test results, encounter notes, upcoming appointments, etc.  Non-urgent messages can be sent to your provider as well.   To learn more about what you can do with MyChart, go to [email or web address].    Your next appointment:   6 month(s)  The format for your next appointment:   In Person  Provider:   [name], DO   Other Instructions      Adopting a Healthy Lifestyle.  Know what a healthy weight is for you (roughly BMI <25) and aim to maintain this   Aim for 7+ servings of fruits  and  vegetables daily   65-80+ fluid ounces of water or unsweet tea for healthy kidneys   Limit to max 1 drink of alcohol per day; avoid smoking/tobacco   Limit animal fats in diet for cholesterol and heart health - choose grass fed whenever available   Avoid highly processed foods, and foods high in saturated/trans fats   Aim for low stress - take time to unwind and care for your mental health   Aim for 150 min of moderate intensity exercise weekly for heart health, and weights twice weekly for bone health   Aim for 7-9 hours of sleep daily   When it comes to diets, agreement about the perfect plan isnt easy to find, even among the experts. Experts at the [facility] [employer] developed an idea known as the Healthy Eating Plate. Just imagine a plate divided into logical, healthy portions.   The emphasis is on diet quality:   Load up on vegetables and fruits - one-half of your plate: Aim for color and variety, and remember that potatoes dont count.   Go for whole grains - one-quarter of your plate: Whole wheat, barley, wheat berries, quinoa, oats, brown rice, and foods made with them. If you want pasta, go with whole wheat pasta.   Protein power - one-quarter of your plate: Fish, chicken, beans, and nuts are all healthy, versatile protein sources. Limit red meat.   The diet, however, does go beyond the plate, offering a few other suggestions.   Use healthy plant oils, such as olive, canola, soy, corn, sunflower and peanut. Check the labels, and avoid partially hydrogenated oil, which have unhealthy trans fats.   If youre thirsty, drink water. Coffee and tea are good in moderation, but skip sugary drinks and limit milk and dairy products to one or two daily servings.   The type of carbohydrate in the diet is more important than the amount. Some sources of carbohydrates, such as vegetables, fruits, whole grains, and beans-are healthier than others.   Finally, stay  active  Signed, [name], DO  11/11/2020 1:19 PM    [facility]

## 2020-11-11 NOTE — Patient Instructions (Signed)
Medication Instructions:  Your physician recommends that you continue on your current medications as directed. Please refer to the Current Medication list given to you today.  *If you need a refill on your cardiac medications before your next appointment, please call your pharmacy*   Lab Work: Your physician recommends that you return for lab work in:   BMET, CBC, MAGNESIUM  If you have labs (blood work) drawn today and your tests are completely normal, you will receive your results only by: [name] MyChart Message (if you have MyChart) OR . A paper copy in the mail If you have any lab test that is abnormal or we need to change your treatment, we will call you to review the results.   Testing/Procedures: NONE   Follow-Up: At [facility], you and your health needs are our priority.  As part of our continuing mission to provide you with exceptional heart care, we have created designated Provider Care Teams.  These Care Teams include your primary Cardiologist (physician) and Advanced Practice Providers (APPs -  Physician Assistants and Nurse Practitioners) who all work together to provide you with the care you need, when you need it.  We recommend signing up for the patient portal called "MyChart".  Sign up information is provided on this After Visit Summary.  MyChart is used to connect with patients for Virtual Visits (Telemedicine).  Patients are able to view lab/test results, encounter notes, upcoming appointments, etc.  Non-urgent messages can be sent to your provider as well.   To learn more about what you can do with MyChart, go to [email or web address].    Your next appointment:   6 month(s)  The format for your next appointment:   In Person  Provider:   [name], DO   Other Instructions

## 2020-11-12 LAB — BASIC METABOLIC PANEL
BUN/Creatinine Ratio: 28 — ABNORMAL HIGH (ref 10–24)
BUN: 23 mg/dL (ref 8–27)
CO2: 22 mmol/L (ref 20–29)
Calcium: 9.3 mg/dL (ref 8.6–10.2)
Chloride: 107 mmol/L — ABNORMAL HIGH (ref 96–106)
Creatinine, Ser: 0.83 mg/dL (ref 0.76–1.27)
GFR calc Af Amer: 105 mL/min/{1.73_m2} (ref >59)
GFR calc non Af Amer: 91 mL/min/{1.73_m2} (ref >59)
Glucose: 86 mg/dL (ref 65–99)
Potassium: 4.5 mmol/L (ref 3.5–5.2)
Sodium: 140 mmol/L (ref 134–144)

## 2020-11-12 LAB — CBC
Hematocrit: 47.6 % (ref 37.5–51.0)
Hemoglobin: 15.7 g/dL (ref 13.0–17.7)
MCH: 29.2 pg (ref 26.6–33.0)
MCHC: 33 g/dL (ref 31.5–35.7)
MCV: 89 fL (ref 79–97)
Platelets: 182 10*3/uL (ref 150–450)
RBC: 5.38 x10E6/uL (ref 4.14–5.80)
RDW: 14.2 % (ref 11.6–15.4)
WBC: 7.8 10*3/uL (ref 3.4–10.8)

## 2020-11-12 LAB — MAGNESIUM: Magnesium: 2 mg/dL (ref 1.6–2.3)

## 2021-02-24 ENCOUNTER — Other Ambulatory Visit: Payer: Self-pay | Admitting: Cardiology

## 2021-05-20 ENCOUNTER — Telehealth: Payer: Self-pay | Admitting: *Deleted

## 2021-05-20 NOTE — Telephone Encounter (Signed)
   Patient Name: Zigmund Linse  DOB: 1951/02/20 MRN: [id number]  Primary Cardiologist: [name], DO  Chart reviewed as part of pre-operative protocol coverage. Cataract extractions are recognized in guidelines as low risk surgeries that do not typically require specific preoperative testing or holding of blood thinner therapy. Therefore, given past medical history and time since last visit, based on ACC/AHA guidelines, Landyn Buckalew would be at acceptable risk for the planned procedure without further cardiovascular testing.   I will route this recommendation to the requesting party via Epic fax function and remove from pre-op pool.  Please call with questions.  Georgie Chard, NP 05/20/2021, 12:38 PM

## 2021-05-20 NOTE — Telephone Encounter (Signed)
   [facility] Pre-operative Risk Assessment    Patient Name: Javante Nilsson  DOB: 1951-04-03 MRN: [id number]  HEARTCARE STAFF:  - IMPORTANT!!!!!! Under Visit Info/Reason for Call, type in Other and utilize the format Clearance MM/DD/YY or Clearance TBD. Do not use dashes or single digits. - Please review there is not already an duplicate clearance open for this procedure. - If request is for dental extraction, please clarify the # of teeth to be extracted. - If the patient is currently at the dentist's office, call Pre-Op Callback Staff (MA/nurse) to input urgent request.  - If the patient is not currently in the dentist office, please route to the Pre-Op pool.  Request for surgical clearance:  What type of surgery is being performed?  LEFT CATARACT EXTRACTION BY PE  When is this surgery scheduled?  05/31/2021  What type of clearance is required (medical clearance vs. Pharmacy clearance to hold med vs. Both)?    Are there any medications that need to be held prior to surgery and how long? LM FOR SURGEON'S OFFICE TO SEE IF NEED TO HOLD PLAVIX / ASPIRIN   Practice name and name of physician performing surgery?   [facility]   What is the office phone number?  [phone]   7.   What is the office fax number?  [phone]  8.   Anesthesia type (None, local, MAC, general) ?  IV SEDATION    Jeanann Lewandowsky 05/20/2021, 12:32 PM  _________________________________________________________________   (provider comments below)

## 2021-07-01 ENCOUNTER — Encounter: Payer: Self-pay | Admitting: Cardiology

## 2021-07-01 ENCOUNTER — Other Ambulatory Visit: Payer: Self-pay

## 2021-07-01 ENCOUNTER — Ambulatory Visit (INDEPENDENT_AMBULATORY_CARE_PROVIDER_SITE_OTHER): Payer: Medicare Other | Admitting: Cardiology

## 2021-07-01 VITALS — BP 138/78 | HR 55 | Ht 65.0 in | Wt 159.0 lb

## 2021-07-01 DIAGNOSIS — I25119 Atherosclerotic heart disease of native coronary artery with unspecified angina pectoris: Secondary | ICD-10-CM | POA: Diagnosis not present

## 2021-07-01 DIAGNOSIS — Z953 Presence of xenogenic heart valve: Secondary | ICD-10-CM

## 2021-07-01 DIAGNOSIS — I10 Essential (primary) hypertension: Secondary | ICD-10-CM | POA: Diagnosis not present

## 2021-07-01 DIAGNOSIS — N1831 Chronic kidney disease, stage 3a: Secondary | ICD-10-CM

## 2021-07-01 DIAGNOSIS — E785 Hyperlipidemia, unspecified: Secondary | ICD-10-CM

## 2021-07-01 DIAGNOSIS — Z951 Presence of aortocoronary bypass graft: Secondary | ICD-10-CM

## 2021-07-01 MED ORDER — NITROGLYCERIN 0.4 MG SL SUBL: 0.4000 mg | SUBLINGUAL_TABLET | SUBLINGUAL | 3 refills | Status: DC | PRN

## 2021-07-01 NOTE — Progress Notes (Signed)
Cardiology Office Note:    Date:  07/01/2021   ID:  Dennis Gomez, DOB 09/11/1951, MRN [id number]  PCP:  [name], MD  Cardiologist:  [name], DO  Electrophysiologist:  None   Referring MD: [name], MD   Chief Complaint  Patient presents with   Follow-up    History of Present Illness:    Dennis Gomez is a 70 y.o. male with a hx of COPD, tobacco abuse (quit), hypothroidism, HTN, acute CHF/LV dysfunction ( EF now 60-65% 08/2019- improvement from 35-40%), moderate-severe aortic regurgitation s/p AVR with inspiris 91mm bioprosthesis, CAD s/p CABG X4 with recent left heart catheterization DES x4 to the mid LAD, 1st marginal, 2nd marginal, and to the L PDA presents for follow up.   I last saw the patient January 31, 2020 at that time he appeared to be stable from a cardiovascular standpoint he had resolved his nosebleed.  We will continue his medication including aspirin statin and recommended follow-up in 6 months.   Per records on May 14 he was doing work when he suddenly experienced loss of vision in his right eye.  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  The etiology was unknown but there was concern about cryptogenic stroke as well.  He was discharged on aspirin and Plavix for 30 days at which time he was asked to stop the aspirin continue Plavix.  He also kept on his Lipitor to milligrams daily.   I saw the patient April 27, 2020 at that time giving his interim report of being hospitalized at [facility] and questionable PFO I had the patient get a echo with bubble study.  Also at the time discussed with the patient he had a TEE doing his valve surgery and it did not show any PFO.   He was able to get his transthoracic echocardiogram done with continued saline study and this was negative for PFO.  In addition due to the concern for  cryptogenic stroke a monitor was placed on the patient.   I did see the patient on June 11, 2020 at that time he reported some typical angina symptoms.  In addition I had the patient were monitored due to dizziness which showed multiple episodes of nonsustained ventricular tachycardia.   I saw the patient on July 02, 2020 at that time he was status post left heart catheterization and he was also hypertensive.  I stopped his metoprolol succinate and started the patient on carvedilol 6.25 mg twice daily.   He was seen on August 07, 2020 at that time he had stopped his Imdur and started experiencing some intermittent chest pain so I restarted his Imdur during our visit.   He was seen on general 26 2022 he appeared to be doing well from a cardiovascular standpoint no medication changes were made.  Since I saw the patient he had seen the ophthalmologist he had a cataract surgery.  He is doing well.  Past Medical History:  Diagnosis Date   Acute kidney injury (HCC) 05/17/2019   Aortic regurgitation    Chest pain 05/16/2019   Chronic combined systolic and diastolic CHF (congestive heart failure) (HCC)    a. LVEF 35-40% at [place] in 04/2019, 45-50% at [facility] pre-AVR. 25-30% by repeat echo 05/2019.   Chronic kidney disease (CKD), stage III (moderate) (HCC)    Chronic systolic congestive heart failure (HCC)  COPD (chronic obstructive pulmonary disease) (HCC)    Coronary artery disease    a. s/p CABGx4 in 05/2019.   Essential hypertension    Hyperlipidemia LDL goal <70    Hypothyroid    Hypothyroidism 05/16/2019   PAD (peripheral artery disease) (HCC)    a. R/L ABIs indicated mild lower extremity arterial disease and carotid duplex showed 1-39% stenosis.   Postoperative atrial fibrillation (HCC)    S/P aortic valve replacement with bioprosthetic valve 05/21/2019   25 mm Edwards Inspiris Resilia stented bovine pericardial tissue valve   S/P CABG x 4 05/21/2019   LIMA to LAD, SVG to D1, SVG to OM,  SVG to LPDA with EVH via right thigh and leg   Severe aortic regurgitation    Syncope 06/17/2019   Tobacco abuse     Past Surgical History:  Procedure Laterality Date   AORTIC VALVE REPLACEMENT N/A 05/21/2019   Procedure: AORTIC VALVE REPLACEMENT (AVR) using Inspiris Valve size 34mm;  Surgeon: Purcell Nails, MD;  Location: [facility] OR;  Service: Open Heart Surgery;  Laterality: N/A;   CORONARY ANGIOGRAPHY N/A 06/26/2020   Procedure: CORONARY ANGIOGRAPHY;  Surgeon: Swaziland, Peter M, MD;  Location: [facility] INVASIVE CV LAB;  Service: Cardiovascular;  Laterality: N/A;   CORONARY ARTERY BYPASS GRAFT N/A 05/21/2019   Procedure: CORONARY ARTERY BYPASS GRAFTING (CABG) x Four , using left internal mammary artery and right leg greater saphenous vein harvested endoscopically;  Surgeon: Purcell Nails, MD;  Location: [facility] OR;  Service: Open Heart Surgery;  Laterality: N/A;   CORONARY STENT INTERVENTION N/A 06/26/2020   Procedure: CORONARY STENT INTERVENTION;  Surgeon: Swaziland, Peter M, MD;  Location: [facility] INVASIVE CV LAB;  Service: Cardiovascular;  Laterality: N/A;   CORONARY/GRAFT ANGIOGRAPHY N/A 06/23/2020   Procedure: CORONARY/GRAFT ANGIOGRAPHY;  Surgeon: Swaziland, Peter M, MD;  Location: [facility] INVASIVE CV LAB;  Service: Cardiovascular;  Laterality: N/A;   INTRAVASCULAR ULTRASOUND/IVUS N/A 06/26/2020   Procedure: Intravascular Ultrasound/IVUS;  Surgeon: Swaziland, Peter M, MD;  Location: [facility] INVASIVE CV LAB;  Service: Cardiovascular;  Laterality: N/A;   RIGHT/LEFT HEART CATH AND CORONARY ANGIOGRAPHY N/A 05/17/2019   Procedure: RIGHT/LEFT HEART CATH AND CORONARY ANGIOGRAPHY;  Surgeon: Tonny Bollman, MD;  Location: [facility] INVASIVE CV LAB;  Service: Cardiovascular;  Laterality: N/A;   TEE WITHOUT CARDIOVERSION N/A 05/21/2019   Procedure: TRANSESOPHAGEAL ECHOCARDIOGRAM (TEE);  Surgeon: Purcell Nails, MD;  Location: [facility] OR;  Service: Open Heart Surgery;  Laterality: N/A;    Current Medications: Current Meds  Medication Sig    atorvastatin (LIPITOR) 80 MG tablet Take 1 tablet (80 mg total) by mouth daily at 6 PM.   carvedilol (COREG) 6.25 MG tablet Take 1 tablet (6.25 mg total) by mouth 2 (two) times daily.   clopidogrel (PLAVIX) 75 MG tablet TAKE 1 TABLET BY MOUTH EVERY DAY (Patient taking differently: Take 75 mg by mouth.)   isosorbide mononitrate (IMDUR) 30 MG 24 hr tablet Take 0.5 tablets (15 mg total) by mouth daily.   levothyroxine (SYNTHROID) 112 MCG tablet Take 112 mcg by mouth daily before breakfast.    nitroGLYCERIN (NITROSTAT) 0.4 MG SL tablet Place 1 tablet (0.4 mg total) under the tongue every 5 (five) minutes as needed for chest pain.   nitroGLYCERIN (NITROSTAT) 0.4 MG SL tablet Place 1 tablet (0.4 mg total) under the tongue every 5 (five) minutes as needed for chest pain.     Allergies:   Hydrocodone, Famotidine, and Prednisone   Social History   Socioeconomic History   Marital  status: Married    Spouse name: Not on file   Number of children: Not on file   Years of education: Not on file   Highest education level: Not on file  Occupational History   Not on file  Tobacco Use   Smoking status: Former    Types: Cigarettes    Quit date: 05/17/2019    Years since quitting: 2.1   Smokeless tobacco: Never  Substance and Sexual Activity   Alcohol use: Not Currently   Drug use: Not on file   Sexual activity: Not on file  Other Topics Concern   Not on file  Social History Narrative   Not on file   Social Determinants of Health   Financial Resource Strain: Not on file  Food Insecurity: Not on file  Transportation Needs: Not on file  Physical Activity: Not on file  Stress: Not on file  Social Connections: Not on file     Family History: The patient's family history includes COPD in his father; Cancer in his mother.  ROS:   Review of Systems  Constitution: Negative for decreased appetite, fever and weight gain.  HENT: Negative for congestion, ear discharge, hoarse voice and sore throat.    Eyes: Negative for discharge, redness, vision loss in right eye and visual halos.  Cardiovascular: Negative for chest pain, dyspnea on exertion, leg swelling, orthopnea and palpitations.  Respiratory: Negative for cough, hemoptysis, shortness of breath and snoring.   Endocrine: Negative for heat intolerance and polyphagia.  Hematologic/Lymphatic: Negative for bleeding problem. Does not bruise/bleed easily.  Skin: Negative for flushing, nail changes, rash and suspicious lesions.  Musculoskeletal: Negative for arthritis, joint pain, muscle cramps, myalgias, neck pain and stiffness.  Gastrointestinal: Negative for abdominal pain, bowel incontinence, diarrhea and excessive appetite.  Genitourinary: Negative for decreased libido, genital sores and incomplete emptying.  Neurological: Negative for brief paralysis, focal weakness, headaches and loss of balance.  Psychiatric/Behavioral: Negative for altered mental status, depression and suicidal ideas.  Allergic/Immunologic: Negative for HIV exposure and persistent infections.    EKGs/Labs/Other Studies Reviewed:    The following studies were reviewed today:   EKG:  The ekg ordered today demonstrates sinus bradycardia, heart rate 54 bpm.  Recent Labs: 11/11/2020: BUN 23; Creatinine, Ser 0.83; Hemoglobin 15.7; Magnesium 2.0; Platelets 182; Potassium 4.5; Sodium 140  Recent Lipid Panel    Component Value Date/Time   CHOL 109 07/31/2019 0855   TRIG 80 07/31/2019 0855   HDL 33 (L) 07/31/2019 0855   CHOLHDL 3.3 07/31/2019 0855   LDLCALC 60 07/31/2019 0855    Physical Exam:    VS:  BP 138/78 (BP Location: Left Arm, Patient Position: Sitting)   Pulse (!) 55   Ht 5\' 5"  (1.651 m)   Wt 159 lb (72.1 kg)   SpO2 94%   BMI 26.46 kg/m     Wt Readings from Last 3 Encounters:  07/01/21 159 lb (72.1 kg)  11/11/20 157 lb 6.4 oz (71.4 kg)  08/07/20 156 lb 6.4 oz (70.9 kg)     GEN: Well nourished, well developed in no acute distress HEENT:  Normal NECK: No JVD; No carotid bruits LYMPHATICS: No lymphadenopathy CARDIAC: S1S2 noted,RRR, no murmurs, rubs, gallops RESPIRATORY:  Clear to auscultation without rales, wheezing or rhonchi  ABDOMEN: Soft, non-tender, non-distended, +bowel sounds, no guarding. EXTREMITIES: No edema, No cyanosis, no clubbing MUSCULOSKELETAL:  No deformity  SKIN: Warm and dry NEUROLOGIC:  Alert and oriented x 3, non-focal PSYCHIATRIC:  Normal affect, good insight  ASSESSMENT:  1. Coronary artery disease involving native coronary artery of native heart with angina pectoris (HCC)   2. S/P aortic valve replacement with bioprosthetic valve   3. Essential hypertension   4. Stage 3a chronic kidney disease (HCC)   5. Hyperlipidemia LDL goal <70   6. S/P CABG x 4    PLAN:    He appears to be doing well from a cardiovascular standpoint.  We will continue patient on his current medication regimen.   2.  No anginal symptoms continue patient on his dual antiplatelet therapy with his atorvastatin.   3.  Blood pressure is acceptable, continue with current antihypertensive regimen.   4.  Hyperlipidemia - continue with current statin medication.   5.  Continued smoking cessation advised   6.  Avoid nephrotoxins  The patient is in agreement with the above plan. The patient left the office in stable condition.  The patient will follow up in  6 months he will follow with Dr. Tomie China.   Medication Adjustments/Labs and Tests Ordered: Current medicines are reviewed at length with the patient today.  Concerns regarding medicines are outlined above.  Orders Placed This Encounter  Procedures   EKG 12-Lead   Meds ordered this encounter  Medications   nitroGLYCERIN (NITROSTAT) 0.4 MG SL tablet    Sig: Place 1 tablet (0.4 mg total) under the tongue every 5 (five) minutes as needed for chest pain.    Dispense:  90 tablet    Refill:  3    Patient Instructions  Medication Instructions:  Your physician  recommends that you continue on your current medications as directed. Please refer to the Current Medication list given to you today.  *If you need a refill on your cardiac medications before your next appointment, please call your pharmacy*   Lab Work: None If you have labs (blood work) drawn today and your tests are completely normal, you will receive your results only by: MyChart Message (if you have MyChart) OR A paper copy in the mail If you have any lab test that is abnormal or we need to change your treatment, we will call you to review the results.   Testing/Procedures: None   Follow-Up: At [facility], you and your health needs are our priority.  As part of our continuing mission to provide you with exceptional heart care, we have created designated Provider Care Teams.  These Care Teams include your primary Cardiologist (physician) and Advanced Practice Providers (APPs -  Physician Assistants and Nurse Practitioners) who all work together to provide you with the care you need, when you need it.  We recommend signing up for the patient portal called "MyChart".  Sign up information is provided on this After Visit Summary.  MyChart is used to connect with patients for Virtual Visits (Telemedicine).  Patients are able to view lab/test results, encounter notes, upcoming appointments, etc.  Non-urgent messages can be sent to your provider as well.   To learn more about what you can do with MyChart, go to [email or web address].    Your next appointment:   6 month(s)  The format for your next appointment:   In Person  Provider:   Belva Crome, MD   Other Instructions     Adopting a Healthy Lifestyle.  Know what a healthy weight is for you (roughly BMI <25) and aim to maintain this   Aim for 7+ servings of fruits and vegetables daily   65-80+ fluid ounces of water or unsweet tea for healthy kidneys  Limit to max 1 drink of alcohol per day; avoid smoking/tobacco    Limit animal fats in diet for cholesterol and heart health - choose grass fed whenever available   Avoid highly processed foods, and foods high in saturated/trans fats   Aim for low stress - take time to unwind and care for your mental health   Aim for 150 min of moderate intensity exercise weekly for heart health, and weights twice weekly for bone health   Aim for 7-9 hours of sleep daily   When it comes to diets, agreement about the perfect plan isnt easy to find, even among the experts. Experts at the [facility] of [employer] developed an idea known as the Healthy Eating Plate. Just imagine a plate divided into logical, healthy portions.   The emphasis is on diet quality:   Load up on vegetables and fruits - one-half of your plate: Aim for color and variety, and remember that potatoes dont count.   Go for whole grains - one-quarter of your plate: Whole wheat, barley, wheat berries, quinoa, oats, brown rice, and foods made with them. If you want pasta, go with whole wheat pasta.   Protein power - one-quarter of your plate: Fish, chicken, beans, and nuts are all healthy, versatile protein sources. Limit red meat.   The diet, however, does go beyond the plate, offering a few other suggestions.   Use healthy plant oils, such as olive, canola, soy, corn, sunflower and peanut. Check the labels, and avoid partially hydrogenated oil, which have unhealthy trans fats.   If youre thirsty, drink water. Coffee and tea are good in moderation, but skip sugary drinks and limit milk and dairy products to one or two daily servings.   The type of carbohydrate in the diet is more important than the amount. Some sources of carbohydrates, such as vegetables, fruits, whole grains, and beans-are healthier than others.   Finally, stay active  Signed, [name], DO  07/01/2021 1:37 PM    [facility]

## 2021-07-01 NOTE — Patient Instructions (Signed)

## 2021-08-15 ENCOUNTER — Other Ambulatory Visit: Payer: Self-pay | Admitting: Cardiology

## 2021-08-17 ENCOUNTER — Telehealth: Payer: Self-pay

## 2021-08-17 NOTE — Telephone Encounter (Signed)
Dr. Servando Salina Pt has a history of CAD s/p CABG with subsequent PCI 06/2020 and CVA [facility]). He remains on plavix monotherapy. We are asked to hld plavix for colonoscopy. From a cardiac standpoint, OK to hold? I will also have the requesting team reach out to neuro/PCP for hx of stroke.

## 2021-08-17 NOTE — Telephone Encounter (Signed)
   [facility] Pre-operative Risk Assessment    Patient Name: Dennis Gomez  DOB: [birth year] MRN: [id number]  HEARTCARE STAFF:  - IMPORTANT!!!!!! Under Visit Info/Reason for Call, type in Other and utilize the format Clearance MM/DD/YY or Clearance TBD. Do not use dashes or single digits. - Please review there is not already an duplicate clearance open for this procedure. - If request is for dental extraction, please clarify the # of teeth to be extracted. - If the patient is currently at the dentist's office, call Pre-Op Callback Staff (MA/nurse) to input urgent request.  - If the patient is not currently in the dentist office, please route to the Pre-Op pool.  Request for surgical clearance:  What type of surgery is being performed? Colonoscopy/EGD  When is this surgery scheduled? 09/02/2021  What type of clearance is required (medical clearance vs. Pharmacy clearance to hold med vs. Both)? Pharmacy clearance to hold med   Are there any medications that need to be held prior to surgery and how long? Plavix  Practice name and name of physician performing surgery?  [facility]; Kyra Leyland, MD   What is the office phone number? [phone]   7.   What is the office fax number? [phone]  8.   Anesthesia type (None, local, MAC, general) ? --   Orvan July 08/17/2021, 10:07 AM  _________________________________________________________________   (provider comments below)

## 2021-08-20 NOTE — Telephone Encounter (Signed)
He has significant burden of CAD. If the procedure can be avoided for a capsule endoscopy that will be ideal. But if this is not an option he can hold the Plavix for 5 days if ok with neurology.

## 2021-08-20 NOTE — Telephone Encounter (Signed)
   Name: Marky Buresh  DOB: 07/28/1951  MRN: [id number]   Primary Cardiologist: [name], DO  Chart reviewed as part of pre-operative protocol coverage.   Per Dr. Servando Salina, given significant burden of CAD would consider capsule study.  However if this is not an option, can hold Plavix 5 days prior to EGD/C-scope with plans to restart as soon as possible when cleared to do so by his gastroenterologist.  I will route this recommendation to the requesting party via Epic fax function and remove from pre-op pool. Please call with questions.  Beatriz Stallion, PA-C 08/20/2021, 5:46 PM

## 2021-08-30 ENCOUNTER — Other Ambulatory Visit: Payer: Self-pay | Admitting: Cardiology

## 2021-10-23 ENCOUNTER — Other Ambulatory Visit: Payer: Self-pay | Admitting: Cardiology

## 2021-12-30 ENCOUNTER — Ambulatory Visit: Payer: Medicare Other | Admitting: Cardiology

## 2022-01-10 ENCOUNTER — Other Ambulatory Visit: Payer: Self-pay

## 2022-01-11 ENCOUNTER — Other Ambulatory Visit: Payer: Self-pay

## 2022-01-11 ENCOUNTER — Encounter: Payer: Self-pay | Admitting: Cardiology

## 2022-01-11 ENCOUNTER — Ambulatory Visit (INDEPENDENT_AMBULATORY_CARE_PROVIDER_SITE_OTHER): Payer: Medicare Other | Admitting: Cardiology

## 2022-01-11 VITALS — BP 134/78 | HR 50 | Ht 64.0 in | Wt 158.2 lb

## 2022-01-11 DIAGNOSIS — I10 Essential (primary) hypertension: Secondary | ICD-10-CM | POA: Diagnosis not present

## 2022-01-11 DIAGNOSIS — I25119 Atherosclerotic heart disease of native coronary artery with unspecified angina pectoris: Secondary | ICD-10-CM

## 2022-01-11 DIAGNOSIS — E785 Hyperlipidemia, unspecified: Secondary | ICD-10-CM | POA: Diagnosis not present

## 2022-01-11 DIAGNOSIS — Z953 Presence of xenogenic heart valve: Secondary | ICD-10-CM

## 2022-01-11 DIAGNOSIS — Z951 Presence of aortocoronary bypass graft: Secondary | ICD-10-CM

## 2022-01-11 NOTE — Patient Instructions (Signed)
Medication Instructions:  ?Your physician recommends that you continue on your current medications as directed. Please refer to the Current Medication list given to you today.  ?*If you need a refill on your cardiac medications before your next appointment, please call your pharmacy* ? ? ?Lab Work: ?None Ordered ?If you have labs (blood work) drawn today and your tests are completely normal, you will receive your results only by: ?MyChart Message (if you have MyChart) OR ?A paper copy in the mail ?If you have any lab test that is abnormal or we need to change your treatment, we will call you to review the results. ? ? ?Testing/Procedures: ?Your physician has requested that you have an echocardiogram. Echocardiography is a painless test that uses sound waves to create images of your heart. It provides your doctor with information about the size and shape of your heart and how well your heart?s chambers and valves are working. This procedure takes approximately one hour. There are no restrictions for this procedure.  ? ? ?Follow-Up: ?At [facility], you and your health needs are our priority.  As part of our continuing mission to provide you with exceptional heart care, we have created designated Provider Care Teams.  These Care Teams include your primary Cardiologist (physician) and Advanced Practice Providers (APPs -  Physician Assistants and Nurse Practitioners) who all work together to provide you with the care you need, when you need it. ? ?We recommend signing up for the patient portal called "MyChart".  Sign up information is provided on this After Visit Summary.  MyChart is used to connect with patients for Virtual Visits (Telemedicine).  Patients are able to view lab/test results, encounter notes, upcoming appointments, etc.  Non-urgent messages can be sent to your provider as well.   ?To learn more about what you can do with MyChart, go to [email or web address].   ? ?Your next appointment:   ?9  month(s) ? ?The format for your next appointment:   ?In Person ? ?Provider:   ?Jyl Heinz, MD  ? ? ?Other Instructions ?None ? ?

## 2022-01-11 NOTE — Progress Notes (Signed)
?Cardiology Office Note:   ? ?Date:  01/11/2022  ? ?ID:  Dennis Gomez, DOB 11/14/1950, MRN [id number] ? ?PCP:  [name], MD  ?Cardiologist:  Garwin Brothers, MD  ? ?Referring MD: [name], MD  ? ? ?ASSESSMENT:   ? ?1. Coronary artery disease involving native coronary artery of native heart with angina pectoris (HCC)   ?2. Essential hypertension   ?3. Hyperlipidemia LDL goal <70   ?4. S/P aortic valve replacement with bioprosthetic valve    ?5. S/P CABG x 4   ? ?PLAN:   ? ?In order of problems listed above: ? ?Coronary artery disease: Secondary prevention stressed with the patient.  Importance of compliance with diet and medication stressed and he vocalized understanding.  He was advised to walk at least half an hour a day 5 days a week and he promises to do so. ?Essential hypertension: Blood pressure stable and diet was emphasized.  Lifestyle modification urged. ?Mixed dyslipidemia: On statin therapy and followed by primary care.  He tells me that he will have blood work next week with primary care and send me a copy. ?Post aortic valve replacement: Echocardiogram and coronary angiography report was reviewed.  Valve function stable at this time. ?Patient will be seen in follow-up appointment in 6 months or earlier if the patient has any concerns ? ? ? ?Medication Adjustments/Labs and Tests Ordered: ?Current medicines are reviewed at length with the patient today.  Concerns regarding medicines are outlined above.  ?No orders of the defined types were placed in this encounter. ? ?No orders of the defined types were placed in this encounter. ? ? ? ?No chief complaint on file. ?  ? ?History of Present Illness:   ? ?Dennis Gomez is a 71 y.o. male.  Patient has past medical history of coronary artery disease post CABG surgery, aortic valve replacement for severe aortic regurgitation, essential hypertension, dyslipidemia and peripheral vascular disease.  He denies any problems at this time and takes  care of activities of daily living.  No chest pain orthopnea or PND.  He tells me that he quit smoking 3 years ago.  He walks on a regular basis.  He is here for routine follow-up.  He saw my partner in the past but now he is going to see me and get established with me.  At the time of my evaluation, the patient is alert awake oriented and in no distress. ? ?Past Medical History:  ?Diagnosis Date  ? Acute kidney injury (HCC) 05/17/2019  ? Aortic regurgitation   ? 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 Chest pain 05/16/2019  ? Chronic combined systolic and diastolic CHF (congestive heart failure) (HCC)   ? a. LVEF 35-40% at [facility] in 04/2019, 45-50% at [facility] pre-AVR. 25-30% by repeat echo 05/2019.  ? Chronic kidney disease (CKD), stage III (moderate) (HCC)   ? Chronic systolic congestive heart failure (HCC)   ? COPD (chronic obstructive pulmonary disease) (HCC)   ? Coronary artery disease   ? a. s/p CABGx4 in 05/2019.  ? Essential hypertension   ? Hyperlipidemia LDL goal <70   ? Hypothyroid   ? Hypothyroidism 05/16/2019  ? Hypothyroidism due to Hashimoto's thyroiditis 09/11/2019  ? PAD (peripheral artery disease) (HCC)   ? a. R/L ABIs indicated mild lower extremity arterial disease and carotid duplex showed 1-39% stenosis.  ? Postoperative atrial fibrillation (HCC)   ? S/P aortic valve replacement with bioprosthetic valve 05/21/2019  ? 25 mm Edwards Inspiris  Resilia stented bovine pericardial tissue valve  ? S/P CABG x 4 05/21/2019  ? LIMA to LAD, SVG to D1, SVG to OM, SVG to LPDA with EVH via right thigh and leg  ? Severe aortic regurgitation   ? Syncope 06/17/2019  ? Tobacco abuse   ? ? ?Past Surgical History:  ?Procedure Laterality Date  ? AORTIC VALVE REPLACEMENT N/A 05/21/2019  ? Procedure: AORTIC VALVE REPLACEMENT (AVR) using Inspiris Valve size 69mm;  Surgeon: Purcell Nails, MD;  Location: [facility] OR;  Service: Open Heart Surgery;  Laterality: N/A;  ? CORONARY ANGIOGRAPHY N/A 06/26/2020  ? Procedure:  CORONARY ANGIOGRAPHY;  Surgeon: Swaziland, Peter M, MD;  Location: [facility] INVASIVE CV LAB;  Service: Cardiovascular;  Laterality: N/A;  ? CORONARY ARTERY BYPASS GRAFT N/A 05/21/2019  ? Procedure: CORONARY ARTERY BYPASS GRAFTING (CABG) x Four , using left internal mammary artery and right leg greater saphenous vein harvested endoscopically;  Surgeon: Purcell Nails, MD;  Location: [facility] OR;  Service: Open Heart Surgery;  Laterality: N/A;  ? CORONARY STENT INTERVENTION N/A 06/26/2020  ? Procedure: CORONARY STENT INTERVENTION;  Surgeon: Swaziland, Peter M, MD;  Location: [facility] INVASIVE CV LAB;  Service: Cardiovascular;  Laterality: N/A;  ? CORONARY/GRAFT ANGIOGRAPHY N/A 06/23/2020  ? Procedure: CORONARY/GRAFT ANGIOGRAPHY;  Surgeon: Swaziland, Peter M, MD;  Location: [facility] INVASIVE CV LAB;  Service: Cardiovascular;  Laterality: N/A;  ? INTRAVASCULAR ULTRASOUND/IVUS N/A 06/26/2020  ? Procedure: Intravascular Ultrasound/IVUS;  Surgeon: Swaziland, Peter M, MD;  Location: [facility] INVASIVE CV LAB;  Service: Cardiovascular;  Laterality: N/A;  ? RIGHT/LEFT HEART CATH AND CORONARY ANGIOGRAPHY N/A 05/17/2019  ? Procedure: RIGHT/LEFT HEART CATH AND CORONARY ANGIOGRAPHY;  Surgeon: Tonny Bollman, MD;  Location: [facility] INVASIVE CV LAB;  Service: Cardiovascular;  Laterality: N/A;  ? TEE WITHOUT CARDIOVERSION N/A 05/21/2019  ? Procedure: TRANSESOPHAGEAL ECHOCARDIOGRAM (TEE);  Surgeon: Purcell Nails, MD;  Location: [facility] OR;  Service: Open Heart Surgery;  Laterality: N/A;  ? ? ?Current Medications: ?Current Meds  ?Medication Sig  ? atorvastatin (LIPITOR) 80 MG tablet Take 1 tablet (80 mg total) by mouth daily at 6 PM.  ? carvedilol (COREG) 6.25 MG tablet TAKE 1 TABLET BY MOUTH TWICE A DAY  ? clopidogrel (PLAVIX) 75 MG tablet Take 1 tablet (75 mg total) by mouth daily.  ? furosemide (LASIX) 20 MG tablet Take 20 mg by mouth every other day.  ? isosorbide mononitrate (IMDUR) 30 MG 24 hr tablet TAKE 1/2 OF A TABLET (15 MG TOTAL) BY MOUTH DAILY  ? levothyroxine (SYNTHROID) 112  MCG tablet Take 112 mcg by mouth daily before breakfast.   ? nitroGLYCERIN (NITROSTAT) 0.4 MG SL tablet Place 0.4 mg under the tongue every 5 (five) minutes as needed for chest pain.  ?  ? ?Allergies:   Hydrocodone, Famotidine, and Prednisone  ? ?Social History  ? ?Socioeconomic History  ? Marital status: Married  ?  Spouse name: Not on file  ? Number of children: Not on file  ? Years of education: Not on file  ? Highest education level: Not on file  ?Occupational History  ? Not on file  ?Tobacco Use  ? Smoking status: Former  ?  Types: Cigarettes  ?  Quit date: 05/17/2019  ?  Years since quitting: 2.6  ? Smokeless tobacco: Never  ?Substance and Sexual Activity  ? Alcohol use: Not Currently  ? Drug use: Not on file  ? Sexual activity: Not on file  ?Other Topics Concern  ? Not on file  ?Social History  Narrative  ? Not on file  ? ?Social Determinants of Health  ? ?Financial Resource Strain: Not on file  ?Food Insecurity: Not on file  ?Transportation Needs: Not on file  ?Physical Activity: Not on file  ?Stress: Not on file  ?Social Connections: Not on file  ?  ? ?Family History: ?The patient's family history includes COPD in his father; Cancer in his mother. ? ?ROS:   ?Please see the history of present illness.    ?All other systems reviewed and are negative. ? ?EKGs/Labs/Other Studies Reviewed:   ? ?The following studies were reviewed today: ?EKG reveals sinus rhythm and nonspecific ST-T changes ? ? ?Recent Labs: ?No results found for requested labs within last 8760 hours.  ?Recent Lipid Panel ?   ?Component Value Date/Time  ? CHOL 109 07/31/2019 0855  ? TRIG 80 07/31/2019 0855  ? HDL 33 (L) 07/31/2019 0855  ? CHOLHDL 3.3 07/31/2019 0855  ? LDLCALC 60 07/31/2019 0855  ? ? ?Physical Exam:   ? ?VS:  BP 134/78   Pulse (!) 50   Ht 5\' 4"  (1.626 m)   Wt 158 lb 3.2 oz (71.8 kg)   SpO2 92%   BMI 27.15 kg/m?    ? ?Wt Readings from Last 3 Encounters:  ?01/11/22 158 lb 3.2 oz (71.8 kg)  ?07/01/21 159 lb (72.1 kg)  ?11/11/20  157 lb 6.4 oz (71.4 kg)  ?  ? ?GEN: Patient is in no acute distress ?HEENT: Normal ?NECK: No JVD; No carotid bruits ?LYMPHATICS: No lymphadenopathy ?CARDIAC: Hear sounds regular, 2/6 systolic murmur at the apex.

## 2022-01-13 ENCOUNTER — Ambulatory Visit (INDEPENDENT_AMBULATORY_CARE_PROVIDER_SITE_OTHER): Payer: Medicare Other

## 2022-01-13 DIAGNOSIS — I25119 Atherosclerotic heart disease of native coronary artery with unspecified angina pectoris: Secondary | ICD-10-CM | POA: Diagnosis not present

## 2022-01-14 LAB — ECHOCARDIOGRAM COMPLETE
AR max vel: 1.63 cm2
AV Area VTI: 1.77 cm2
AV Area mean vel: 1.7 cm2
AV Mean grad: 10.5 mmHg
AV Peak grad: 21.3 mmHg
Ao pk vel: 2.31 m/s
Area-P 1/2: 3.12 cm2
MV M vel: 5 m/s
MV Peak grad: 100 mmHg
S' Lateral: 2.2 cm

## 2022-08-20 ENCOUNTER — Other Ambulatory Visit: Payer: Self-pay | Admitting: Cardiology

## 2022-08-22 NOTE — Telephone Encounter (Signed)
Rx refill sent to pharmacy. 

## 2022-09-30 ENCOUNTER — Other Ambulatory Visit: Payer: Self-pay | Admitting: Cardiology

## 2022-10-02 ENCOUNTER — Other Ambulatory Visit: Payer: Self-pay | Admitting: Cardiology

## 2022-10-07 ENCOUNTER — Other Ambulatory Visit: Payer: Self-pay

## 2022-10-11 ENCOUNTER — Encounter: Payer: Self-pay | Admitting: Cardiology

## 2022-10-11 ENCOUNTER — Ambulatory Visit: Payer: Medicare Other | Attending: Cardiology | Admitting: Cardiology

## 2022-10-11 VITALS — BP 124/72 | HR 71 | Ht 64.0 in | Wt 162.4 lb

## 2022-10-11 DIAGNOSIS — E785 Hyperlipidemia, unspecified: Secondary | ICD-10-CM | POA: Insufficient documentation

## 2022-10-11 DIAGNOSIS — I10 Essential (primary) hypertension: Secondary | ICD-10-CM | POA: Diagnosis not present

## 2022-10-11 DIAGNOSIS — Z951 Presence of aortocoronary bypass graft: Secondary | ICD-10-CM | POA: Diagnosis not present

## 2022-10-11 DIAGNOSIS — Z953 Presence of xenogenic heart valve: Secondary | ICD-10-CM | POA: Insufficient documentation

## 2022-10-11 DIAGNOSIS — I25119 Atherosclerotic heart disease of native coronary artery with unspecified angina pectoris: Secondary | ICD-10-CM | POA: Insufficient documentation

## 2022-10-11 NOTE — Patient Instructions (Signed)
Medication Instructions:  Your physician recommends that you continue on your current medications as directed. Please refer to the Current Medication list given to you today.  *If you need a refill on your cardiac medications before your next appointment, please call your pharmacy*   Lab Work: None ordered If you have labs (blood work) drawn today and your tests are completely normal, you will receive your results only by: MyChart Message (if you have MyChart) OR A paper copy in the mail If you have any lab test that is abnormal or we need to change your treatment, we will call you to review the results.   Testing/Procedures: None ordered   Follow-Up: At [facility], you and your health needs are our priority.  As part of our continuing mission to provide you with exceptional heart care, we have created designated Provider Care Teams.  These Care Teams include your primary Cardiologist (physician) and Advanced Practice Providers (APPs -  Physician Assistants and Nurse Practitioners) who all work together to provide you with the care you need, when you need it.  We recommend signing up for the patient portal called "MyChart".  Sign up information is provided on this After Visit Summary.  MyChart is used to connect with patients for Virtual Visits (Telemedicine).  Patients are able to view lab/test results, encounter notes, upcoming appointments, etc.  Non-urgent messages can be sent to your provider as well.   To learn more about what you can do with MyChart, go to [email or web address].    Your next appointment:   9 month(s)  The format for your next appointment:   In Person  Provider:   Rajan Revankar, MD    Other Instructions none  Important Information About Sugar       

## 2022-10-11 NOTE — Progress Notes (Signed)
Cardiology Office Note:    Date:  10/11/2022   ID:  Dennis Gomez, DOB 09/10/1951, MRN [id number]  PCP:  [name], MD  Cardiologist:  Garwin Brothers, MD   Referring MD: [name], MD    ASSESSMENT:    1. Coronary artery disease involving native coronary artery of native heart with angina pectoris (HCC)   2. Essential hypertension   3. S/P aortic valve replacement with bioprosthetic valve    4. S/P CABG x 4   5. Hyperlipidemia LDL goal <70    PLAN:    In order of problems listed above:  Coronary artery disease: Secondary prevention stressed with the patient.  Importance of compliance with diet medication stressed and he vocalized understanding.  he was advised to walk at least half an hour a day 5 days a week and vocalized understanding. Essential hypertension: Blood pressure stable and diet was emphasized. Mixed dyslipidemia: On lipid-lowering medications followed by primary care.  He tells me that lipids checked 3 months ago were fine. Post aortic valve replacement: Stable.  Echo report reviewed and discussed with the patient at length. Patient will be seen in follow-up appointment in 6 months or earlier if the patient has any concerns    Medication Adjustments/Labs and Tests Ordered: Current medicines are reviewed at length with the patient today.  Concerns regarding medicines are outlined above.  No orders of the defined types were placed in this encounter.  No orders of the defined types were placed in this encounter.    No chief complaint on file.    History of Present Illness:    Dennis Gomez is a 71 y.o. male.  Patient has past medical history of coronary artery disease, cardiomyopathy, aortic valve regurgitation, essential hypertension and dyslipidemia.  He is an ex-smoker.  He denies any problems at this time and takes care of activities of daily living.  No chest pain orthopnea or PND.  Overall he leads an active lifestyle but does not  exercise on a regular basis.  At the time of my evaluation, the patient is alert awake oriented and in no distress.  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 LVEF 35-40% at [place] [facility] in 04/2019, 45-50% at [facility] pre-AVR. 25-30% by repeat echo 05/2019.   Chronic kidney disease (CKD), stage III (moderate) (HCC)    Chronic systolic congestive heart failure (HCC)    COPD (chronic obstructive pulmonary disease) (HCC)    Coronary artery disease    a. s/p CABGx4 in 05/2019.   Essential hypertension    Hyperlipidemia LDL goal <70    Hypothyroid    Hypothyroidism 05/16/2019   Hypothyroidism due to Hashimoto's thyroiditis 09/11/2019   PAD (peripheral artery disease) (HCC)    a. R/L ABIs indicated mild lower extremity arterial disease and carotid duplex showed 1-39% stenosis.   Postoperative atrial fibrillation (HCC)    S/P aortic valve replacement with bioprosthetic valve 05/21/2019   25 mm Edwards Inspiris Resilia stented bovine pericardial tissue valve   S/P CABG x 4 05/21/2019   LIMA to LAD, SVG to D1, SVG to OM, SVG to LPDA with [facility] via right thigh and leg   Severe aortic regurgitation    Syncope 06/17/2019   Tobacco abuse     Past Surgical  History:  Procedure Laterality Date   AORTIC VALVE REPLACEMENT N/A 05/21/2019   Procedure: AORTIC VALVE REPLACEMENT (AVR) using Inspiris Valve size 50mm;  Surgeon: Purcell Nails, MD;  Location: [facility] OR;  Service: Open Heart Surgery;  Laterality: N/A;   CORONARY ANGIOGRAPHY N/A 06/26/2020   Procedure: CORONARY ANGIOGRAPHY;  Surgeon: Swaziland, Peter M, MD;  Location: [facility] INVASIVE CV LAB;  Service: Cardiovascular;  Laterality: N/A;   CORONARY ARTERY BYPASS GRAFT N/A 05/21/2019   Procedure: CORONARY ARTERY BYPASS GRAFTING (CABG) x Four , using left  internal mammary artery and right leg greater saphenous vein harvested endoscopically;  Surgeon: Purcell Nails, MD;  Location: [facility] OR;  Service: Open Heart Surgery;  Laterality: N/A;   CORONARY STENT INTERVENTION N/A 06/26/2020   Procedure: CORONARY STENT INTERVENTION;  Surgeon: Swaziland, Peter M, MD;  Location: [facility] INVASIVE CV LAB;  Service: Cardiovascular;  Laterality: N/A;   CORONARY/GRAFT ANGIOGRAPHY N/A 06/23/2020   Procedure: CORONARY/GRAFT ANGIOGRAPHY;  Surgeon: Swaziland, Peter M, MD;  Location: [facility] INVASIVE CV LAB;  Service: Cardiovascular;  Laterality: N/A;   INTRAVASCULAR ULTRASOUND/IVUS N/A 06/26/2020   Procedure: Intravascular Ultrasound/IVUS;  Surgeon: Swaziland, Peter M, MD;  Location: [facility] INVASIVE CV LAB;  Service: Cardiovascular;  Laterality: N/A;   RIGHT/LEFT HEART CATH AND CORONARY ANGIOGRAPHY N/A 05/17/2019   Procedure: RIGHT/LEFT HEART CATH AND CORONARY ANGIOGRAPHY;  Surgeon: Tonny Bollman, MD;  Location: [facility] INVASIVE CV LAB;  Service: Cardiovascular;  Laterality: N/A;   TEE WITHOUT CARDIOVERSION N/A 05/21/2019   Procedure: TRANSESOPHAGEAL ECHOCARDIOGRAM (TEE);  Surgeon: Purcell Nails, MD;  Location: [facility] OR;  Service: Open Heart Surgery;  Laterality: N/A;    Current Medications: Current Meds  Medication Sig   atorvastatin (LIPITOR) 80 MG tablet Take 1 tablet (80 mg total) by mouth daily at 6 PM.   carvedilol (COREG) 6.25 MG tablet Take 1 tablet (6.25 mg total) by mouth 2 (two) times daily.   clopidogrel (PLAVIX) 75 MG tablet Take 1 tablet (75 mg total) by mouth daily.   isosorbide mononitrate (IMDUR) 30 MG 24 hr tablet Take 0.5 tablets (15 mg total) by mouth daily.   levothyroxine (SYNTHROID) 112 MCG tablet Take 112 mcg by mouth daily before breakfast.    nitroGLYCERIN (NITROSTAT) 0.4 MG SL tablet Place 0.4 mg under the tongue every 5 (five) minutes as needed for chest pain.   omeprazole (PRILOSEC) 40 MG capsule Take 40 mg by mouth at bedtime.     Allergies:   Hydrocodone,  Famotidine, and Prednisone   Social History   Socioeconomic History   Marital status: Married    Spouse name: Not on file   Number of children: Not on file   Years of education: Not on file   Highest education level: Not on file  Occupational History   Not on file  Tobacco Use   Smoking status: Former    Types: Cigarettes    Quit date: 05/17/2019    Years since quitting: 3.4   Smokeless tobacco: Never  Substance and Sexual Activity   Alcohol use: Not Currently   Drug use: Not on file   Sexual activity: Not on file  Other Topics Concern   Not on file  Social History Narrative   Not on file   Social Determinants of Health   Financial Resource Strain: Not on file  Food Insecurity: Not on file  Transportation Needs: Not on file  Physical Activity: Not on file  Stress: Not on file  Social Connections: Not on file  Family History: The patient's family history includes COPD in his father; Cancer in his mother.  ROS:   Please see the history of present illness.    All other systems reviewed and are negative.  EKGs/Labs/Other Studies Reviewed:    The following studies were reviewed today: EKG reveals sinus rhythm and nonspecific ST-T changes   Recent Labs: No results found for requested labs within last 365 days.  Recent Lipid Panel    Component Value Date/Time   CHOL 109 07/31/2019 0855   TRIG 80 07/31/2019 0855   HDL 33 (L) 07/31/2019 0855   CHOLHDL 3.3 07/31/2019 0855   LDLCALC 60 07/31/2019 0855    Physical Exam:    VS:  BP 124/72   Pulse 71   Ht 5\' 4"  (1.626 m)   Wt 162 lb 6.4 oz (73.7 kg)   SpO2 96%   BMI 27.88 kg/m     Wt Readings from Last 3 Encounters:  10/11/22 162 lb 6.4 oz (73.7 kg)  01/11/22 158 lb 3.2 oz (71.8 kg)  07/01/21 159 lb (72.1 kg)     GEN: Patient is in no acute distress HEENT: Normal NECK: No JVD; No carotid bruits LYMPHATICS: No lymphadenopathy CARDIAC: Hear sounds regular, 2/6 systolic murmur at the  apex. RESPIRATORY:  Clear to auscultation without rales, wheezing or rhonchi  ABDOMEN: Soft, non-tender, non-distended MUSCULOSKELETAL:  No edema; No deformity  SKIN: Warm and dry NEUROLOGIC:  Alert and oriented x 3 PSYCHIATRIC:  Normal affect   Signed, 07/03/21, MD  10/11/2022 2:33 PM    [facility]

## 2022-12-19 ENCOUNTER — Other Ambulatory Visit: Payer: Self-pay | Admitting: Cardiology

## 2022-12-20 ENCOUNTER — Other Ambulatory Visit: Payer: Self-pay | Admitting: Cardiology

## 2023-01-11 ENCOUNTER — Other Ambulatory Visit: Payer: Self-pay | Admitting: Cardiology

## 2023-01-11 NOTE — Telephone Encounter (Signed)
Clopidogrel 75 mg # 90 x 2 refills sent to  [id number] - [place], [place]

## 2023-07-07 ENCOUNTER — Other Ambulatory Visit: Payer: Self-pay | Admitting: Cardiology

## 2023-07-18 ENCOUNTER — Ambulatory Visit: Payer: Medicare Other | Attending: Cardiology | Admitting: Cardiology

## 2023-07-18 ENCOUNTER — Encounter: Payer: Self-pay | Admitting: Cardiology

## 2023-07-18 VITALS — BP 118/66 | HR 72 | Ht 64.0 in | Wt 155.4 lb

## 2023-07-18 DIAGNOSIS — E785 Hyperlipidemia, unspecified: Secondary | ICD-10-CM | POA: Diagnosis present

## 2023-07-18 DIAGNOSIS — Z953 Presence of xenogenic heart valve: Secondary | ICD-10-CM

## 2023-07-18 DIAGNOSIS — I25119 Atherosclerotic heart disease of native coronary artery with unspecified angina pectoris: Secondary | ICD-10-CM

## 2023-07-18 DIAGNOSIS — Z951 Presence of aortocoronary bypass graft: Secondary | ICD-10-CM | POA: Diagnosis present

## 2023-07-18 NOTE — Patient Instructions (Signed)

## 2023-07-18 NOTE — Progress Notes (Signed)
Cardiology Office Note:    Date:  07/18/2023   ID:  Unkown Defronzo, DOB [birth year], MRN [id number]  PCP:  [name], MD  Cardiologist:  Garwin Brothers, MD   Referring MD: [name], MD    ASSESSMENT:    1. Coronary artery disease involving native coronary artery of native heart with angina pectoris (HCC)   2. S/P CABG x 4   3. S/P aortic valve replacement with bioprosthetic valve    4. Hyperlipidemia LDL goal <70    PLAN:    In order of problems listed above:  Coronary artery disease: Secondary prevention stressed with the patient.  Importance of compliance with diet medications stressed and he vocalized understanding.  He was advised to walk at least half an hour a day and he promises to do so.  His wife accompanied him for this visit. Essential hypertension: Blood pressure stable and diet was emphasized. Mixed dyslipidemia: On lipid-lowering medications followed by primary care.  She had blood work yesterday and will send me a copy of the report when she gets 1. Post aortic valve replacement: Stable echo reviewed and discussed with the patient. Patient will be seen in follow-up appointment in 6 months or earlier if the patient has any concerns.    Medication Adjustments/Labs and Tests Ordered: Current medicines are reviewed at length with the patient today.  Concerns regarding medicines are outlined above.  No orders of the defined types were placed in this encounter.  No orders of the defined types were placed in this encounter.    Chief Complaint  Patient presents with   Follow-up     History of Present Illness:    Dennis Gomez is a 72 y.o. male.  Patient has past medical history of coronary artery disease, aortic valve replacement, essential hypertension and mixed dyslipidemia.  He denies any problems at this time and takes care of activities of daily living.  No chest pain or orthopnea or PND.  He is an active gentleman.  At the time of my  evaluation, the patient is alert awake oriented and in no distress.  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 LVEF 35-40% at [place] in 04/2019, 45-50% at [facility] pre-AVR. 25-30% by repeat echo 05/2019.   Chronic kidney disease (CKD), stage III (moderate) (HCC)    Chronic systolic congestive heart failure (HCC)    COPD (chronic obstructive pulmonary disease) (HCC)    Coronary artery disease    a. s/p CABGx4 in 05/2019.   Essential hypertension    Hyperlipidemia LDL goal <70    Hypothyroid    Hypothyroidism 05/16/2019   Hypothyroidism due to Hashimoto's thyroiditis 09/11/2019   PAD (peripheral artery disease) (HCC)    a. R/L ABIs indicated mild lower extremity arterial disease and carotid duplex showed 1-39% stenosis.   Postoperative atrial fibrillation (HCC)    S/P aortic valve replacement with bioprosthetic valve 05/21/2019   25 mm Edwards Inspiris Resilia stented bovine pericardial tissue valve   S/P CABG x 4 05/21/2019   LIMA to LAD, SVG to D1, SVG to OM, SVG to LPDA with [facility] via right thigh and leg   Severe aortic regurgitation    Syncope 06/17/2019   Tobacco abuse     Past Surgical History:  Procedure  Laterality Date   AORTIC VALVE REPLACEMENT N/A 05/21/2019   Procedure: AORTIC VALVE REPLACEMENT (AVR) using Inspiris Valve size 25mm;  Surgeon: Purcell Nails, MD;  Location: [facility] OR;  Service: Open Heart Surgery;  Laterality: N/A;   CORONARY ANGIOGRAPHY N/A 06/26/2020   Procedure: CORONARY ANGIOGRAPHY;  Surgeon: Swaziland, Peter M, MD;  Location: [facility] INVASIVE CV LAB;  Service: Cardiovascular;  Laterality: N/A;   CORONARY ARTERY BYPASS GRAFT N/A 05/21/2019   Procedure: CORONARY ARTERY BYPASS GRAFTING (CABG) x Four , using left internal mammary artery and right leg greater  saphenous vein harvested endoscopically;  Surgeon: Purcell Nails, MD;  Location: [facility] OR;  Service: Open Heart Surgery;  Laterality: N/A;   CORONARY STENT INTERVENTION N/A 06/26/2020   Procedure: CORONARY STENT INTERVENTION;  Surgeon: Swaziland, Peter M, MD;  Location: [facility] INVASIVE CV LAB;  Service: Cardiovascular;  Laterality: N/A;   CORONARY ULTRASOUND/IVUS N/A 06/26/2020   Procedure: Intravascular Ultrasound/IVUS;  Surgeon: Swaziland, Peter M, MD;  Location: [facility] INVASIVE CV LAB;  Service: Cardiovascular;  Laterality: N/A;   CORONARY/GRAFT ANGIOGRAPHY N/A 06/23/2020   Procedure: CORONARY/GRAFT ANGIOGRAPHY;  Surgeon: Swaziland, Peter M, MD;  Location[facility] INVASIVE CV LAB;  Service: Cardiovascular;  Laterality: N/A;   RIGHT/LEFT HEART CATH AND CORONARY ANGIOGRAPHY N/A 05/17/2019   Procedure: RIGHT/LEFT HEART CATH AND CORONARY ANGIOGRAPHY;  Surgeon: Tonny Bollman, MD;  Location: [facility] INVASIVE CV LAB;  Service: Cardiovascular;  Laterality: N/A;   TEE WITHOUT CARDIOVERSION N/A 05/21/2019   Procedure: TRANSESOPHAGEAL ECHOCARDIOGRAM (TEE);  Surgeon: Purcell Nails, MD;  Location: [facility] OR;  Service: Open Heart Surgery;  Laterality: N/A;    Current Medications: Current Meds  Medication Sig   atorvastatin (LIPITOR) 80 MG tablet Take 1 tablet (80 mg total) by mouth daily at 6 PM.   carvedilol (COREG) 6.25 MG tablet Take 1 tablet (6.25 mg total) by mouth 2 (two) times daily.   clopidogrel (PLAVIX) 75 MG tablet Take 1 tablet (75 mg total) by mouth daily.   isosorbide mononitrate (IMDUR) 30 MG 24 hr tablet Take 0.5 tablets (15 mg total) by mouth daily.   levothyroxine (SYNTHROID) 112 MCG tablet Take 112 mcg by mouth daily before breakfast.    nitroGLYCERIN (NITROSTAT) 0.4 MG SL tablet Place 0.4 mg under the tongue every 5 (five) minutes as needed for chest pain.   omeprazole (PRILOSEC) 40 MG capsule Take 40 mg by mouth at bedtime.     Allergies:   Hydrocodone, Famotidine, and Prednisone   Social History    Socioeconomic History   Marital status: Married    Spouse name: Not on file   Number of children: Not on file   Years of education: Not on file   Highest education level: Not on file  Occupational History   Not on file  Tobacco Use   Smoking status: Former    Current packs/day: 0.00    Types: Cigarettes    Quit date: 05/17/2019    Years since quitting: 4.1   Smokeless tobacco: Never  Substance and Sexual Activity   Alcohol use: Not Currently   Drug use: Not on file   Sexual activity: Not on file  Other Topics Concern   Not on file  Social History Narrative   Not on file   Social Determinants of Health   Financial Resource Strain: Not on file  Food Insecurity: Not on file  Transportation Needs: Not on file  Physical Activity: Not on file  Stress: Not on file  Social Connections: Not on file  Family History: The patient's family history includes COPD in his father; Cancer in his mother.  ROS:   Please see the history of present illness.    All other systems reviewed and are negative.  EKGs/Labs/Other Studies Reviewed:    The following studies were reviewed today: I discussed my findings with the patient at length.   Recent Labs: No results found for requested labs within last 365 days.  Recent Lipid Panel    Component Value Date/Time   CHOL 109 07/31/2019 0855   TRIG 80 07/31/2019 0855   HDL 33 (L) 07/31/2019 0855   CHOLHDL 3.3 07/31/2019 0855   LDLCALC 60 07/31/2019 0855    Physical Exam:    VS:  BP 118/66 (BP Location: Left Arm, Patient Position: Sitting)   Pulse 72   Ht 5\' 4"  (1.626 m)   Wt 155 lb 6.4 oz (70.5 kg)   SpO2 92%   BMI 26.67 kg/m     Wt Readings from Last 3 Encounters:  07/18/23 155 lb 6.4 oz (70.5 kg)  10/11/22 162 lb 6.4 oz (73.7 kg)  01/11/22 158 lb 3.2 oz (71.8 kg)     GEN: Patient is in no acute distress HEENT: Normal NECK: No JVD; No carotid bruits LYMPHATICS: No lymphadenopathy CARDIAC: Hear sounds regular, 2/6  systolic murmur at the apex. RESPIRATORY:  Clear to auscultation without rales, wheezing or rhonchi  ABDOMEN: Soft, non-tender, non-distended MUSCULOSKELETAL:  No edema; No deformity  SKIN: Warm and dry NEUROLOGIC:  Alert and oriented x 3 PSYCHIATRIC:  Normal affect   Signed, Garwin Brothers, MD  07/18/2023 3:48 PM    [facility]

## 2023-09-02 ENCOUNTER — Other Ambulatory Visit: Payer: Self-pay | Admitting: Cardiology

## 2023-10-03 ENCOUNTER — Other Ambulatory Visit: Payer: Self-pay | Admitting: Cardiology

## 2023-11-03 ENCOUNTER — Telehealth: Payer: Self-pay | Admitting: Cardiology

## 2023-11-03 ENCOUNTER — Other Ambulatory Visit: Payer: Self-pay | Admitting: Cardiology

## 2023-11-03 NOTE — Telephone Encounter (Signed)
Spoke with pharmacist. She states Dr. Trudee Gomez sent a refill of Imdur with different instructions and wanted the patient to clarify with [place]. Dennis Gomez states the patient is on Imdur  taking 1/2 of tablet daily as Dr. Tomie Gomez been prescribing. She also stated she only called Dr. Trudee Gomez office to refill levothyroxine.  I called the pharmacy and clarified our script that is  on file last sent on 10/03/23(on our records as well) that is what the patient should be on per Dr. Kem Gomez notes.  Pharmacy deleted Dr. Trudee Gomez script and has filled Dr. Tomie Gomez script. Patient notified.

## 2023-11-03 NOTE — Telephone Encounter (Signed)
Pt c/o medication issue:  1. Name of Medication:  isosorbide mononitrate (IMDUR) 30 MG 24 hr tablet   2. How are you currently taking this medication (dosage and times per day)? As prescribed  3. Are you having a reaction (difficulty breathing--STAT)? No   4. What is your medication issue? Patient's spouse is calling stating the pharmacy advised they would need to speak with their provider before a refill could be received. Spouse request a VM be left if she does not answer when calling back.   Please advise.

## 2024-01-02 ENCOUNTER — Telehealth: Payer: Self-pay | Admitting: Cardiology

## 2024-01-02 MED ORDER — ISOSORBIDE MONONITRATE ER 30 MG PO TB24: 30.0000 mg | ORAL_TABLET | Freq: Every day | ORAL | 2 refills | Status: DC

## 2024-01-02 NOTE — Telephone Encounter (Signed)
*  STAT* If patient is at the pharmacy, call can be transferred to refill team.   1. Which medications need to be refilled? (please list name of each medication and dose if known) isosorbide mononitrate (IMDUR) 30 MG 24 hr tablet   2. Which pharmacy/location (including street and city if local pharmacy) is medication to be sent to?[id number] - [place], [place] - [address]   3. Do they need a 30 day or 90 day supply? 90

## 2024-01-02 NOTE — Telephone Encounter (Signed)
 Refill sent.

## 2024-01-17 ENCOUNTER — Other Ambulatory Visit: Payer: Self-pay

## 2024-01-17 MED ORDER — CLOPIDOGREL BISULFATE 75 MG PO TABS: 75.0000 mg | ORAL_TABLET | Freq: Every day | ORAL | 1 refills | Status: DC

## 2024-04-23 ENCOUNTER — Ambulatory Visit: Attending: Cardiology | Admitting: Cardiology

## 2024-04-23 ENCOUNTER — Encounter: Payer: Self-pay | Admitting: Cardiology

## 2024-04-23 VITALS — BP 110/80 | HR 69 | Ht 64.0 in | Wt 153.0 lb

## 2024-04-23 DIAGNOSIS — I25119 Atherosclerotic heart disease of native coronary artery with unspecified angina pectoris: Secondary | ICD-10-CM | POA: Diagnosis not present

## 2024-04-23 DIAGNOSIS — Z953 Presence of xenogenic heart valve: Secondary | ICD-10-CM | POA: Diagnosis present

## 2024-04-23 DIAGNOSIS — I10 Essential (primary) hypertension: Secondary | ICD-10-CM | POA: Diagnosis not present

## 2024-04-23 DIAGNOSIS — E785 Hyperlipidemia, unspecified: Secondary | ICD-10-CM | POA: Diagnosis present

## 2024-04-23 DIAGNOSIS — Z951 Presence of aortocoronary bypass graft: Secondary | ICD-10-CM | POA: Diagnosis present

## 2024-04-23 NOTE — Patient Instructions (Signed)

## 2024-04-23 NOTE — Progress Notes (Signed)
 Cardiology Office Note:    Date:  04/23/2024   ID:  Dennis Gomez, DOB 1951-09-23, MRN [id number]  PCP:  Clemmie Nest, MD  Cardiologist:  Jennifer JONELLE Crape, MD   Referring MD: Clemmie Nest, MD    ASSESSMENT:    1. Essential hypertension   2. Coronary artery disease involving native coronary artery of native heart with angina pectoris (HCC)   3. S/P aortic valve replacement with bioprosthetic valve    4. S/P CABG x 4   5. Hyperlipidemia LDL goal <70    PLAN:    In order of problems listed above:  Coronary artery disease: Secondary prevention stressed with the patient.  Importance of compliance with diet medication stressed and patient verbalized standing. Patient was advised to walk at least half an hour a day on a daily basis and he promises to do so. Mixed dyslipidemia: On lipid-lowering medications followed by primary care.  Goal LDL should be less than 60.  Diet emphasized. Prosthetic aortic valve replacement: Stable echo report discussed with the patient at length. Patient will be seen in follow-up appointment in 6 months or earlier if the patient has any concerns.    Medication Adjustments/Labs and Tests Ordered: Current medicines are reviewed at length with the patient today.  Concerns regarding medicines are outlined above.  Orders Placed This Encounter  Procedures   EKG 12-Lead   No orders of the defined types were placed in this encounter.    Chief Complaint  Patient presents with   Follow-up     History of Present Illness:    Dennis Gomez is a 73 y.o. male.  Patient has past medical history of coronary artery disease post CABG surgery, essential hypertension, mixed dyslipidemia post aortic valve replacement.  He denies any problems at this time and takes care of activities of daily living.  No chest pain orthopnea or PND.  At the time of my evaluation, the patient is alert awake oriented and in no distress.  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 LVEF 35-40% at [place] in 04/2019, 45-50% at [facility] pre-AVR. 25-30% by repeat echo 05/2019.   Chronic kidney disease (CKD), stage III (moderate) (HCC)    Chronic systolic congestive heart failure (HCC)    COPD (chronic obstructive pulmonary disease) (HCC)    Coronary artery disease    a. s/p CABGx4 in 05/2019.   Essential hypertension    Hyperlipidemia LDL goal <70    Hypothyroid    Hypothyroidism 05/16/2019   Hypothyroidism due to Hashimoto's thyroiditis 09/11/2019   PAD (peripheral artery disease) (HCC)    a. R/L ABIs indicated mild lower extremity arterial disease and carotid duplex showed 1-39% stenosis.   Postoperative atrial fibrillation (HCC)    S/P aortic valve replacement with bioprosthetic valve 05/21/2019   25 mm Edwards Inspiris Resilia stented bovine pericardial tissue valve   S/P CABG x 4 05/21/2019   LIMA to LAD, SVG to D1, SVG to OM, SVG to LPDA with [facility] via right thigh and leg   Severe aortic regurgitation    Syncope 06/17/2019   Tobacco abuse     Past Surgical History:  Procedure Laterality Date   AORTIC VALVE REPLACEMENT N/A 05/21/2019   Procedure: AORTIC VALVE REPLACEMENT (AVR) using Inspiris Valve size  25mm;  Surgeon: Dusty Sudie DEL, MD;  Location: [facility] OR;  Service: Open Heart Surgery;  Laterality: N/A;   CORONARY ANGIOGRAPHY N/A 06/26/2020   Procedure: CORONARY ANGIOGRAPHY;  Surgeon: Swaziland, Peter M, MD;  Location: [facility] INVASIVE CV LAB;  Service: Cardiovascular;  Laterality: N/A;   CORONARY ARTERY BYPASS GRAFT N/A 05/21/2019   Procedure: CORONARY ARTERY BYPASS GRAFTING (CABG) x Four , using left internal mammary artery and right leg greater saphenous vein harvested endoscopically;  Surgeon: Dusty Sudie DEL, MD;  Location: [facility] OR;  Service: Open  Heart Surgery;  Laterality: N/A;   CORONARY STENT INTERVENTION N/A 06/26/2020   Procedure: CORONARY STENT INTERVENTION;  Surgeon: Swaziland, Peter M, MD;  Location: [facility] INVASIVE CV LAB;  Service: Cardiovascular;  Laterality: N/A;   CORONARY ULTRASOUND/IVUS N/A 06/26/2020   Procedure: Intravascular Ultrasound/IVUS;  Surgeon: Swaziland, Peter M, MD;  Location: [facility] INVASIVE CV LAB;  Service: Cardiovascular;  Laterality: N/A;   CORONARY/GRAFT ANGIOGRAPHY N/A 06/23/2020   Procedure: CORONARY/GRAFT ANGIOGRAPHY;  Surgeon: Swaziland, Peter M, MD;  Location: [facility] INVASIVE CV LAB;  Service: Cardiovascular;  Laterality: N/A;   RIGHT/LEFT HEART CATH AND CORONARY ANGIOGRAPHY N/A 05/17/2019   Procedure: RIGHT/LEFT HEART CATH AND CORONARY ANGIOGRAPHY;  Surgeon: Wonda Sharper, MD;  Location: [facility] INVASIVE CV LAB;  Service: Cardiovascular;  Laterality: N/A;   TEE WITHOUT CARDIOVERSION N/A 05/21/2019   Procedure: TRANSESOPHAGEAL ECHOCARDIOGRAM (TEE);  Surgeon: Dusty Sudie DEL, MD;  Location: [facility] OR;  Service: Open Heart Surgery;  Laterality: N/A;    Current Medications: Current Meds  Medication Sig   albuterol  (VENTOLIN  HFA) 108 (90 Base) MCG/ACT inhaler Inhale 2 puffs into the lungs every 6 (six) hours as needed for wheezing or shortness of breath.   atorvastatin  (LIPITOR ) 80 MG tablet Take 1 tablet (80 mg total) by mouth daily at 6 PM.   benzonatate (TESSALON) 200 MG capsule Take 200 mg by mouth at bedtime as needed.   carvedilol  (COREG ) 6.25 MG tablet Take 1 tablet (6.25 mg total) by mouth 2 (two) times daily.   clopidogrel  (PLAVIX ) 75 MG tablet Take 1 tablet (75 mg total) by mouth daily.   doxycycline (VIBRAMYCIN) 100 MG capsule Take 100 mg by mouth 2 (two) times daily.   isosorbide  mononitrate (IMDUR ) 30 MG 24 hr tablet Take 1 tablet (30 mg total) by mouth daily.   levothyroxine  (SYNTHROID ) 112 MCG tablet Take 112 mcg by mouth daily before breakfast.    nitroGLYCERIN  (NITROSTAT ) 0.4 MG SL tablet Place 0.4 mg under the tongue  every 5 (five) minutes as needed for chest pain.   omeprazole (PRILOSEC) 40 MG capsule Take 40 mg by mouth at bedtime.     Allergies:   Hydrocodone, Famotidine , and Prednisone    Social History   Socioeconomic History   Marital status: Married    Spouse name: Not on file   Number of children: Not on file   Years of education: Not on file   Highest education level: Not on file  Occupational History   Not on file  Tobacco Use   Smoking status: Former    Current packs/day: 0.00    Types: Cigarettes    Quit date: 05/17/2019    Years since quitting: 4.9   Smokeless tobacco: Never  Substance and Sexual Activity   Alcohol use: Not Currently   Drug use: Not on file   Sexual activity: Not on file  Other Topics Concern   Not on file  Social History Narrative   Not on file   Social Drivers  of Health   Financial Resource Strain: Not on file  Food Insecurity: Not on file  Transportation Needs: Not on file  Physical Activity: Not on file  Stress: Not on file  Social Connections: Not on file     Family History: The patient's family history includes COPD in his father; Cancer in his mother.  ROS:   Please see the history of present illness.    All other systems reviewed and are negative.  EKGs/Labs/Other Studies Reviewed:    The following studies were reviewed today: .SABRAEKG Interpretation Date/Time:  Tuesday April 23 2024 15:16:57 EDT Ventricular Rate:  69 PR Interval:  162 QRS Duration:  102 QT Interval:  382 QTC Calculation: 409 R Axis:   14  Text Interpretation: Normal sinus rhythm Minimal voltage criteria for LVH, may be normal variant ( Cornell product ) Septal infarct , age undetermined When compared with ECG of 26-Jun-2020 11:59, Criteria for Inferior infarct are no longer Present Confirmed by Edwyna Backers [phone]) on 04/23/2024 3:18:42 PM     Recent Labs: No results found for requested labs within last 365 days.  Recent Lipid Panel    Component Value Date/Time    CHOL 109 07/31/2019 0855   TRIG 80 07/31/2019 0855   HDL 33 (L) 07/31/2019 0855   CHOLHDL 3.3 07/31/2019 0855   LDLCALC 60 07/31/2019 0855    Physical Exam:    VS:  BP 110/80   Pulse 69   Ht 5' 4 (1.626 m)   Wt 153 lb (69.4 kg)   SpO2 91%   BMI 26.26 kg/m     Wt Readings from Last 3 Encounters:  04/23/24 153 lb (69.4 kg)  07/18/23 155 lb 6.4 oz (70.5 kg)  10/11/22 162 lb 6.4 oz (73.7 kg)     GEN: Patient is in no acute distress HEENT: Normal NECK: No JVD; No carotid bruits LYMPHATICS: No lymphadenopathy CARDIAC: Hear sounds regular, 2/6 systolic murmur at the apex. RESPIRATORY:  Clear to auscultation without rales, wheezing or rhonchi  ABDOMEN: Soft, non-tender, non-distended MUSCULOSKELETAL:  No edema; No deformity  SKIN: Warm and dry NEUROLOGIC:  Alert and oriented x 3 PSYCHIATRIC:  Normal affect   Signed, Backers JONELLE Edwyna, MD  04/23/2024 3:20 PM     [facility]

## 2024-06-21 DIAGNOSIS — I714 Abdominal aortic aneurysm, without rupture, unspecified: Secondary | ICD-10-CM | POA: Diagnosis not present

## 2024-07-09 ENCOUNTER — Other Ambulatory Visit: Payer: Self-pay

## 2024-07-09 MED ORDER — CARVEDILOL 6.25 MG PO TABS: 6.2500 mg | ORAL_TABLET | Freq: Two times a day (BID) | ORAL | 2 refills | Status: AC

## 2024-07-10 ENCOUNTER — Other Ambulatory Visit: Payer: Self-pay | Admitting: Cardiology

## 2024-07-24 DIAGNOSIS — J449 Chronic obstructive pulmonary disease, unspecified: Secondary | ICD-10-CM | POA: Diagnosis not present

## 2024-07-24 DIAGNOSIS — Z79899 Other long term (current) drug therapy: Secondary | ICD-10-CM | POA: Diagnosis not present

## 2024-07-24 DIAGNOSIS — E1169 Type 2 diabetes mellitus with other specified complication: Secondary | ICD-10-CM | POA: Diagnosis not present

## 2024-07-24 DIAGNOSIS — E039 Hypothyroidism, unspecified: Secondary | ICD-10-CM | POA: Diagnosis not present

## 2024-07-24 DIAGNOSIS — I714 Abdominal aortic aneurysm, without rupture, unspecified: Secondary | ICD-10-CM | POA: Diagnosis not present

## 2024-07-24 DIAGNOSIS — I5030 Unspecified diastolic (congestive) heart failure: Secondary | ICD-10-CM | POA: Diagnosis not present

## 2024-07-24 DIAGNOSIS — I25119 Atherosclerotic heart disease of native coronary artery with unspecified angina pectoris: Secondary | ICD-10-CM | POA: Diagnosis not present

## 2024-07-24 DIAGNOSIS — E785 Hyperlipidemia, unspecified: Secondary | ICD-10-CM | POA: Diagnosis not present

## 2024-07-24 DIAGNOSIS — Z0000 Encounter for general adult medical examination without abnormal findings: Secondary | ICD-10-CM | POA: Diagnosis not present

## 2024-10-05 ENCOUNTER — Other Ambulatory Visit: Payer: Self-pay | Admitting: Cardiology
